# Patient Record
Sex: Female | Born: 1980
Health system: Southern US, Community
[De-identification: ages and names within clinical notes are randomized; demographics above are authoritative.]

## PROBLEM LIST (undated history)

## (undated) DIAGNOSIS — K279 Peptic ulcer, site unspecified, unspecified as acute or chronic, without hemorrhage or perforation: Secondary | ICD-10-CM

## (undated) DIAGNOSIS — J302 Other seasonal allergic rhinitis: Secondary | ICD-10-CM

## (undated) DIAGNOSIS — B009 Herpesviral infection, unspecified: Secondary | ICD-10-CM

## (undated) DIAGNOSIS — M5000 Cervical disc disorder with myelopathy, unspecified cervical region: Secondary | ICD-10-CM

## (undated) DIAGNOSIS — B977 Papillomavirus as the cause of diseases classified elsewhere: Secondary | ICD-10-CM

## (undated) DIAGNOSIS — F32A Depression, unspecified: Secondary | ICD-10-CM

## (undated) DIAGNOSIS — T8859XA Other complications of anesthesia, initial encounter: Secondary | ICD-10-CM

## (undated) DIAGNOSIS — Z7729 Contact with and (suspected ) exposure to other hazardous substances: Secondary | ICD-10-CM

## (undated) DIAGNOSIS — N871 Moderate cervical dysplasia: Secondary | ICD-10-CM

## (undated) DIAGNOSIS — G43909 Migraine, unspecified, not intractable, without status migrainosus: Secondary | ICD-10-CM

## (undated) DIAGNOSIS — J45990 Exercise induced bronchospasm: Secondary | ICD-10-CM

## (undated) DIAGNOSIS — F419 Anxiety disorder, unspecified: Secondary | ICD-10-CM

## (undated) DIAGNOSIS — T4145XA Adverse effect of unspecified anesthetic, initial encounter: Secondary | ICD-10-CM

## (undated) HISTORY — DX: Moderate cervical dysplasia: N87.1

## (undated) HISTORY — PX: ESOPHAGOGASTRODUODENOSCOPY: SHX1529

## (undated) HISTORY — DX: Peptic ulcer, site unspecified, unspecified as acute or chronic, without hemorrhage or perforation: K27.9

## (undated) HISTORY — DX: Contact with and (suspected) exposure to other hazardous substances: Z77.29

## (undated) HISTORY — DX: Other seasonal allergic rhinitis: J30.2

## (undated) HISTORY — DX: Anxiety disorder, unspecified: F41.9

## (undated) HISTORY — DX: Papillomavirus as the cause of diseases classified elsewhere: B97.7

## (undated) HISTORY — DX: Exercise induced bronchospasm: J45.990

## (undated) HISTORY — DX: Herpesviral infection, unspecified: B00.9

## (undated) HISTORY — DX: Depression, unspecified: F32.A

## (undated) HISTORY — DX: Migraine, unspecified, not intractable, without status migrainosus: G43.909

---

## 1999-06-29 HISTORY — PX: OTHER SURGICAL HISTORY: SHX169

## 2002-06-14 ENCOUNTER — Other Ambulatory Visit: Admission: RE | Admit: 2002-06-14 | Discharge: 2002-06-14 | Payer: Self-pay | Admitting: Gynecology

## 2003-08-05 ENCOUNTER — Other Ambulatory Visit: Admission: RE | Admit: 2003-08-05 | Discharge: 2003-08-05 | Payer: Self-pay | Admitting: Obstetrics and Gynecology

## 2004-05-11 ENCOUNTER — Encounter: Admission: RE | Admit: 2004-05-11 | Discharge: 2004-06-10 | Payer: Self-pay | Admitting: Family Medicine

## 2004-08-21 ENCOUNTER — Other Ambulatory Visit: Admission: RE | Admit: 2004-08-21 | Discharge: 2004-08-21 | Payer: Self-pay | Admitting: Gynecology

## 2005-08-23 ENCOUNTER — Other Ambulatory Visit: Admission: RE | Admit: 2005-08-23 | Discharge: 2005-08-23 | Payer: Self-pay | Admitting: Gynecology

## 2006-01-31 ENCOUNTER — Other Ambulatory Visit: Admission: RE | Admit: 2006-01-31 | Discharge: 2006-01-31 | Payer: Self-pay | Admitting: Gynecology

## 2006-09-14 ENCOUNTER — Other Ambulatory Visit: Admission: RE | Admit: 2006-09-14 | Discharge: 2006-09-14 | Payer: Self-pay | Admitting: Gynecology

## 2007-06-14 ENCOUNTER — Other Ambulatory Visit: Admission: RE | Admit: 2007-06-14 | Discharge: 2007-06-14 | Payer: Self-pay | Admitting: Gynecology

## 2008-02-16 ENCOUNTER — Other Ambulatory Visit: Admission: RE | Admit: 2008-02-16 | Discharge: 2008-02-16 | Payer: Self-pay | Admitting: Gynecology

## 2008-07-24 ENCOUNTER — Ambulatory Visit: Payer: Self-pay | Admitting: Women's Health

## 2008-07-24 ENCOUNTER — Encounter: Payer: Self-pay | Admitting: Women's Health

## 2008-07-24 ENCOUNTER — Other Ambulatory Visit: Admission: RE | Admit: 2008-07-24 | Discharge: 2008-07-24 | Payer: Self-pay | Admitting: Gynecology

## 2009-05-05 ENCOUNTER — Encounter: Admission: RE | Admit: 2009-05-05 | Discharge: 2009-05-05 | Payer: Self-pay | Admitting: Allergy

## 2009-05-14 ENCOUNTER — Ambulatory Visit: Payer: Self-pay | Admitting: Women's Health

## 2009-07-25 ENCOUNTER — Ambulatory Visit: Payer: Self-pay | Admitting: Women's Health

## 2009-07-25 ENCOUNTER — Other Ambulatory Visit: Admission: RE | Admit: 2009-07-25 | Discharge: 2009-07-25 | Payer: Self-pay | Admitting: Gynecology

## 2009-10-16 ENCOUNTER — Ambulatory Visit: Payer: Self-pay | Admitting: Women's Health

## 2010-06-05 DIAGNOSIS — J45909 Unspecified asthma, uncomplicated: Secondary | ICD-10-CM | POA: Insufficient documentation

## 2010-06-12 DIAGNOSIS — J309 Allergic rhinitis, unspecified: Secondary | ICD-10-CM | POA: Insufficient documentation

## 2010-07-10 ENCOUNTER — Ambulatory Visit
Admission: RE | Admit: 2010-07-10 | Discharge: 2010-07-10 | Payer: Self-pay | Source: Home / Self Care | Attending: Women's Health | Admitting: Women's Health

## 2010-07-27 ENCOUNTER — Other Ambulatory Visit (HOSPITAL_COMMUNITY)
Admission: RE | Admit: 2010-07-27 | Discharge: 2010-07-27 | Disposition: A | Discharge status: Home / Self Care | Payer: Self-pay | Source: Ambulatory Visit | Attending: Gynecology | Admitting: Gynecology

## 2010-07-27 ENCOUNTER — Ambulatory Visit
Admission: RE | Admit: 2010-07-27 | Discharge: 2010-07-27 | Payer: Self-pay | Source: Home / Self Care | Attending: Women's Health | Admitting: Women's Health

## 2010-07-27 ENCOUNTER — Other Ambulatory Visit: Payer: Self-pay | Admitting: Women's Health

## 2010-07-27 DIAGNOSIS — R87810 Cervical high risk human papillomavirus (HPV) DNA test positive: Secondary | ICD-10-CM | POA: Insufficient documentation

## 2010-07-27 DIAGNOSIS — Z124 Encounter for screening for malignant neoplasm of cervix: Secondary | ICD-10-CM | POA: Insufficient documentation

## 2010-07-29 DIAGNOSIS — B977 Papillomavirus as the cause of diseases classified elsewhere: Secondary | ICD-10-CM

## 2010-07-29 HISTORY — DX: Papillomavirus as the cause of diseases classified elsewhere: B97.7

## 2010-12-09 ENCOUNTER — Other Ambulatory Visit: Payer: Self-pay | Admitting: Gynecology

## 2010-12-09 ENCOUNTER — Ambulatory Visit (INDEPENDENT_AMBULATORY_CARE_PROVIDER_SITE_OTHER): Payer: 59 | Admitting: Gynecology

## 2010-12-09 DIAGNOSIS — R87619 Unspecified abnormal cytological findings in specimens from cervix uteri: Secondary | ICD-10-CM

## 2011-01-27 DIAGNOSIS — G43909 Migraine, unspecified, not intractable, without status migrainosus: Secondary | ICD-10-CM

## 2011-01-27 HISTORY — DX: Migraine, unspecified, not intractable, without status migrainosus: G43.909

## 2011-06-29 HISTORY — PX: LASER ABLATION OF THE CERVIX: SHX1949

## 2011-07-06 ENCOUNTER — Other Ambulatory Visit: Payer: Self-pay | Admitting: Women's Health

## 2011-08-05 ENCOUNTER — Ambulatory Visit (INDEPENDENT_AMBULATORY_CARE_PROVIDER_SITE_OTHER): Payer: 59 | Admitting: Women's Health

## 2011-08-05 ENCOUNTER — Encounter: Payer: Self-pay | Admitting: Women's Health

## 2011-08-05 VITALS — BP 114/72 | Ht 66.5 in | Wt 124.5 lb

## 2011-08-05 DIAGNOSIS — IMO0001 Reserved for inherently not codable concepts without codable children: Secondary | ICD-10-CM

## 2011-08-05 DIAGNOSIS — F32A Depression, unspecified: Secondary | ICD-10-CM

## 2011-08-05 DIAGNOSIS — F419 Anxiety disorder, unspecified: Secondary | ICD-10-CM

## 2011-08-05 DIAGNOSIS — F329 Major depressive disorder, single episode, unspecified: Secondary | ICD-10-CM

## 2011-08-05 DIAGNOSIS — Z01419 Encounter for gynecological examination (general) (routine) without abnormal findings: Secondary | ICD-10-CM

## 2011-08-05 DIAGNOSIS — F3289 Other specified depressive episodes: Secondary | ICD-10-CM

## 2011-08-05 DIAGNOSIS — Z309 Encounter for contraceptive management, unspecified: Secondary | ICD-10-CM

## 2011-08-05 DIAGNOSIS — G43909 Migraine, unspecified, not intractable, without status migrainosus: Secondary | ICD-10-CM

## 2011-08-05 DIAGNOSIS — F341 Dysthymic disorder: Secondary | ICD-10-CM

## 2011-08-05 MED ORDER — DULOXETINE HCL 30 MG PO CPEP: 30.0000 mg | ORAL_CAPSULE | Freq: Every day | ORAL | Status: DC

## 2011-08-05 MED ORDER — LO LOESTRIN FE 1 MG-10 MCG / 10 MCG PO TABS: 1.0000 | ORAL_TABLET | Freq: Every day | ORAL | Status: DC

## 2011-08-05 NOTE — Progress Notes (Signed)
Kristie [name] 1981-02-19 [id number]    History:    The patient presents for annual exam.  Amenorrheic on lo loEstrin FE. Not sexually active. History of ASCUS/+HR HPV with  Pap 2012 with negative C&B. history of CIN-1 in 07 with normal Paps after. History of anxiety and depression stable on Cymbalta and counseling.   Past medical history, past surgical history, family history and social history were all reviewed and documented in the EPIC chart. 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  ROS:  A  ROS was performed and pertinent positives and negatives are included in the history.  Exam:  Filed Vitals:   08/05/11 1535  BP: 114/72    General appearance:  Normal Head/Neck:  Normal, without cervical or supraclavicular adenopathy. Thyroid:  Symmetrical, normal in size, without palpable masses or nodularity. Respiratory  Effort:  Normal  Auscultation:  Clear without wheezing or rhonchi Cardiovascular  Auscultation:  Regular rate, without rubs, murmurs or gallops  Edema/varicosities:  Not grossly evident Abdominal  Soft,nontender, without masses, guarding or rebound.  Liver/spleen:  No organomegaly noted  Hernia:  None appreciated  Skin  Inspection:  Grossly normal  Palpation:  Grossly normal Neurologic/psychiatric  Orientation:  Normal with appropriate conversation.  Mood/affect:  Normal  Genitourinary    Breasts: Examined lying and sitting.     Right: Without masses, retractions, discharge or axillary adenopathy.     Left: Without masses, retractions, discharge or axillary adenopathy.   Inguinal/mons:  Normal without inguinal adenopathy  External genitalia:  Normal  BUS/Urethra/Skene's glands:  Normal  Bladder:  Normal  Vagina:  Normal  Cervix:  Normal  Uterus:   normal in size, shape and contour.  Midline and mobile  Adnexa/parametria:     Rt: Without masses or tenderness.   Lt: Without masses or tenderness.  Anus and perineum: Normal  Digital rectal exam: Normal sphincter tone without palpated  masses or tenderness  Assessment/Plan:  31 y.o. S WF G0  for annual exam.   History of ascus/+HR HPV with negative colposcopy in 2012 Anxiety and depression stable on Cymbalta 30 and counseling Migraines decreased with diet change and Topamax per Dr. Neale Burly  Plan: Lo Loestrin FE prescription, proper use, slight risk for blood clots and strokes, less effective while on Topamax. Encouraged condoms when become sexually active. SBE's, exercise, calcium rich diet, MVI daily encouraged. Prescription proper use of Cymbalta 30 given, we'll continue counseling as well. Had normal labs at [facility] health screening Pap only today. If Pap normal return in 6 months for repeat Pap   Harrington Challenger [facility], 4:38 PM 08/05/2011

## 2011-08-06 ENCOUNTER — Other Ambulatory Visit: Payer: Self-pay

## 2011-08-06 ENCOUNTER — Other Ambulatory Visit (HOSPITAL_COMMUNITY)
Admission: RE | Admit: 2011-08-06 | Discharge: 2011-08-06 | Disposition: A | Discharge status: Home / Self Care | Payer: 59 | Source: Ambulatory Visit | Attending: Obstetrics and Gynecology | Admitting: Obstetrics and Gynecology

## 2011-08-06 DIAGNOSIS — Z01419 Encounter for gynecological examination (general) (routine) without abnormal findings: Secondary | ICD-10-CM

## 2011-08-06 NOTE — Progress Notes (Signed)
Addended by: Venora Maples on: 08/06/2011 08:47 AM   Modules accepted: Orders

## 2011-08-17 ENCOUNTER — Ambulatory Visit (INDEPENDENT_AMBULATORY_CARE_PROVIDER_SITE_OTHER): Payer: 59 | Admitting: Gynecology

## 2011-08-17 ENCOUNTER — Encounter: Payer: Self-pay | Admitting: Gynecology

## 2011-08-17 VITALS — BP 110/70

## 2011-08-17 DIAGNOSIS — N871 Moderate cervical dysplasia: Secondary | ICD-10-CM | POA: Insufficient documentation

## 2011-08-17 NOTE — Progress Notes (Signed)
Patient is a 31 year old gravida 0 who presented to the office today as a result of her recent abnormal Pap smear which demonstrated the following:  HIGH GRADE SQUAMOUS INTRAEPITHELIAL LESION: CIN-2/ CIN-3 (HSIL).  Review of her record indicated the following: 2003 through 2006 normal Pap smears 2007 low-grade SIL with high-risk HPV not detected ( colposcopy negative biopsy 2007 and 2008) 2008 through 2011 normal Pap smears 2012 ASCUS 2013 high-grade squamous intraepithelial lesion: CIN-2/CIN-3 (HSIL)  Colposcopic evaluation today as follows:  Physical Exam  Genitourinary:     Extensive colposcopic evaluation was undertaken to include the external genitalia, perineum, and perirectal region and no abnormality was noted. Upon placement of the speculum the entire vagina was inspected after acetic acid was applied and no lesions were seen. The ectocervix and transformation zone was visualized. A raised leukoplakic area was noted at the 6:00 position which extended from the 3 to the 9:00 position some irregular vascularity was noted around the 3:00 position. The 12:00 position was a small leukoplakic area extending into the endocervical canal but the lesion was visualized entirely. A vigorous ECC was obtained. All the above specimens were labeled appropriately and submitted for histological evaluation. Will await results and plan a course of management. Literature information was provided on the diagnosis and management of abnormal Pap smears. Monsel solution was used for hemostasis. All questions rancher will follow accordingly.

## 2011-08-17 NOTE — Patient Instructions (Signed)
Patient information: Management of high grade cervical squamous intraepithelial lesions (HSIL) and glandular abnormalities (AGC) (Beyond the Basics)  Authors Lanna Poche, MD Thayer Ohm, MD Section Editor Alvera Novel, MD Deputy Editor Morton Amy, MD Disclosures  All topics are updated as new evidence becomes available and our peer review process is complete.  Literature review current through: Jan 2013.  This topic last updated: Aug 09, 2011.  ABNORMAL PAP SMEAR OVERVIEW - High grade cervical squamous intraepithelial lesion (HSIL, also called high grade cervical intraepithelial neoplasia) is the name given to moderately to severely abnormal-appearing cells on a Pap smear (also called a cervical cytology test). Any woman with HSIL requires further evaluation to determine if cancerous cells are present. While only about 2 percent of women with HSIL have invasive cancer, up to 20 percent of women with HSIL will eventually develop cancer if the abnormality is not treated. Atypical glandular cells (AGC) is the name given to abnormal appearing glandular cells on a cervical cytology test. Glandular cells line the opening in the cervix (picture 1). AGC is a relatively uncommon result, although it always requires further evaluation. AGC can be caused by benign conditions, such as cervical polyps, or more serious conditions, such as cancer of the cervix, uterine lining (endometrium), ovary, or fallopian tube. This topic review discusses the management of women with high grade squamous intraepithelial lesions (HSIL) and glandular abnormalities (AGC) of the cervix. Management of atypical squamous cells (ASC-US and ASC-H) and low grade squamous intraepithelial lesions (LSIL) is discussed separately. (See "Patient information: Management of atypical squamous cells (ASC-US and ASC-H) and low grade cervical squamous intraepithelial lesions (LSIL) (Beyond the Basics)".) Cervical cancer  screening tests are also discussed in a separate topic review. (See "Patient information: Cervical cancer screening (Beyond the Basics)".) HIGH-GRADE SQUAMOUS LESION (HSIL) - HSIL refers to moderate to severe precancerous changes of the cells of the cervix. Approximately 2 percent of women with HSIL on a Pap smear are found to have invasive cervical cancer when they undergo further evaluation and another 20 percent of women with HSIL will develop cervical cancer over a period of several years if they are not treated. However, if the precancerous lesion is removed or destroyed, cervical cancer can usually be prevented. Evaluation of HSIL - All women with high-grade (HSIL) on Pap smear should have one of the following: Colposcopy of the cervix, including biopsy of any abnormal areas and endocervical curettage (ECC). Management after colposcopy depends upon the results. (See 'Colposcopy' below.)   If the healthcare provider is unable to see the entire cervix during colposcopy, surgical removal of the abnormal area is recommended (eg, loop electrosurgical excision, conization). (See "Patient information: Treatment of precancerous cells of the cervix (Beyond the Basics)".)  Alternatively, the woman and her provider may decide to remove the abnormal area at the time of the initial colposcopy. This is called "see and treat". (See "Patient information: Treatment of precancerous cells of the cervix (Beyond the Basics)".) This option is not recommended for adolescents and pregnant women. (See 'Special populations' below.) COLPOSCOPY - Colposcopy is an office procedure that allows a clinician to closely examine the cervix. It is commonly performed after an abnormal Pap smear. Colposcopy is performed while the woman lies on an examination table, similar to a routine pelvic examination. A speculum is used to view the cervix, and the viewing device (called a colposcope) remains outside the woman's body (picture 1). The  colposcope magnifies the appearance of the cervix. This allows  the clinician to better see the location and size of any abnormalities, and also to see any changes in the capillaries (small blood vessels) on the surface of the cervix. Capillary changes are not detected by cervical cytology or human papillomavirus (HPV tests), but are important signs of the severity of cervical abnormalities. During colposcopy, a small piece of the abnormal area can be removed (biopsied). Anesthesia (numbing medicine) is not needed because the biopsy causes only mild discomfort or cramping. Women with HSIL or AGC usually require a biopsy of the inner cervix during colposcopy; this is called endocervical curettage (ECC). Endocervix refers to the inner cervix and curettage means scraping. Pregnant women should not have ECC because it may disturb the pregnancy. Management of HSIL after colposcopy - Most women with HSIL results on a Pap smear will have a biopsy of any abnormal-appearing areas during colposcopy. The biopsy samples are sent to a pathologist, who determines if there is any evidence of precancerous changes, termed cervical intraepithelial neoplasia (CIN). These changes are categorized as being mild (CIN 1) or moderate to severe (CIN 2 or 3). The following management strategies apply to non-pregnant women who are 82 years-old or older. Management of adolescents and pregnant women is discussed separately (see 'Special populations' below). CIN 2 or 3 - If the healthcare provider is able to see the entire cervix during colposcopy and the biopsy shows CIN 2 or 3, treatment to remove (excise) the abnormal area is recommended to prevent cancer. Delaying treatment (eg, watching and waiting) is not recommended for women age 40 or older with CIN 2 or 3, given the high risk of progression to cancer. (See "Patient information: Treatment of precancerous cells of the cervix (Beyond the Basics)".) CIN 1 or less preceded by HSIL -  Colposcopy can miss a significant number of seriously abnormal CIN lesions. If a woman has HSIL but the colposcopy/biopsy do not show a high grade lesion, the woman and her provider need to decide what else should be done to make sure a serious lesion has not been missed. The following options are available: Close monitoring, including cervical cytology and colposcopy at 6 and 12 months. At these visits, the provider must be able to see the entire cervix during colposcopy and a test of the inner cervix (called endocervical curettage) must be negative. This may be the preferred approach for younger women who would like to preserve their ability to carry a pregnancy in the future.  If these tests are negative, the woman may return to once yearly testing.  If either test show persistent HSIL, a treatment to remove the abnormal area is recommended. (See "Patient information: Treatment of precancerous cells of the cervix (Beyond the Basics)".)  Remove the abnormal area. Excision is recommended because it can both treat any abnormal areas and determine with certainty what abnormality was present. (See "Patient information: Treatment of precancerous cells of the cervix (Beyond the Basics)".)  In some cases, a healthcare provider will request an expert review of the woman's cytology and biopsy. This generally involves sending the cervical cytology and biopsy slides to an outside pathologist who is expert in evaluating abnormal Pap smears. If the expert feels that the woman has moderate to severe changes, a treatment to remove the abnormal area may be recommended. If the pathologist feels that there are mild to moderate changes, the woman and her provider may elect to monitor these changes with cervical cytology and colposcopy every six months. SQUAMOUS CELL CARCINOMA - Squamous cell carcinoma  is the medical term for cervical cancer. Women with this result require a biopsy, which is usually performed with colposcopy  (see 'Colposcopy' above). If the biopsy confirms that cancerous cells are present, treatment is strongly recommended. The diagnosis and treatment of early stage cervical cancer is discussed in a separate article. (See "Patient information: Cervical cancer treatment; early stage cancer (Beyond the Basics)".) GLANDULAR CELL ABNORMALITIES (AGC) - Glandular cells develop from the inside of the cervix (called the endocervical canal). Glandular cells can also come from the endometrium (lining of the uterus), the fallopian tube, or the ovary (figure 1). Evaluation - All women with atypical glandular cells (AGC) require further testing, including HPV testing, colposcopy, cervical biopsy, endocervical curettage, and often endometrial biopsy. This is because 10 to 40 percent of women with atypical glandular cells have a precancerous or cancerous abnormality. These tests are usually performed in a single visit. HPV testing is done by sweeping the surface of the cervix with a brush, which is then placed into a vial containing a liquid preservative. The vial is sent to a laboratory for evaluation.  Colposcopy, cervical biopsy, and endocervical curettage are described above (see 'Colposcopy' above).  Endometrial biopsy is performed by inserting a thin instrument through the vagina into the uterus to obtain a small sample of endometrial tissue. The tissue is then sent to a pathologist, who examines it with a microscope. The biopsy can be performed in a healthcare provider's office without anesthesia. Management after colposcopy If all of these tests are normal and the initial cervical cytology test showed AGC-NOS (not otherwise specified), follow-up recommendations depend on the HPV status. If the HPV test was positive (or unknown), a repeat cervical cytology smear and HPV test are recommended at six months. If the HPV test was negative, the cervical cytology smear and HPV test should be repeated at 12 months. If either  is positive, a repeat colposcopy with biopsies is recommended. If both tests are negative, the woman may return to routine screening.  If all of these tests are normal and the initial cervical cytology test showed AGC-favor neoplasia or adenocarcinoma in situ, a treatment to remove (excise, not ablate) the abnormal area is recommended. (See "Patient information: Treatment of precancerous cells of the cervix (Beyond the Basics)".) SPECIAL POPULATIONS Pregnant women - The evaluation and management of pregnant women is different from non-pregnant women because of the risk that treatment to remove abnormal cervical tissue could lead to significant bleeding or preterm labor or delivery. Cervical biopsy is performed only as necessary in pregnant women. If a pregnant woman has a Pap smear with high-grade intraepithelial lesion (HSIL) or atypical glandular cells (AGC), a colposcopy should be done. If a biopsy is performed, the results are managed as follows: CIN 2 or 3 - If the biopsy confirms CIN 2 or 3, the woman and her provider can choose to repeat the cervical cytology and colposcopy later during the pregnancy (three to four months later) or after the woman delivers her baby (six or more weeks after delivery). Treatment to remove the abnormal area is not recommended during pregnancy unless invasive cancer is suspected.  The reason for this recommendation is that CIN 2 or 3 is caused by precancerous changes that have the potential to become cancerous when untreated. This is a slow process that takes many months to years. As long as the abnormality is monitored, it is not necessary to remove the area (and increase the risk of preterm delivery or miscarriage) until after delivery.  CIN 1 - After a cervical cytology report showing HSIL, if a pregnant woman's colposcopy and biopsy show CIN 1, a repeat Pap smear and colposcopy should be done six weeks after the woman delivers her baby.   The reason for this  recommendation is that cervix appears somewhat different during pregnancy, which can make it difficult to determine if an area appears abnormal due to pregnancy or due to precancerous changes. Evaluating the area after delivery allows the provider to determine with more certainty if treatment is needed. Adolescents - Cervical cancer screening is recommended starting at age 62 years. In this discussion, adolescents refers to age 25 or younger. In adolescents, abnormal cervical cytology is often approached differently because, in this age group, there is a good chance that mild abnormalities will resolve over time, without treatment. There is a high rate of HPV infection in this group, but a very low rate of cervical cancer. If a Pap smear is performed in someone who is age 40 or younger, the recommendations are:  HSIL - Colposcopy should be done. Treatment may be needed, but it should not be done before the results of biopsies are known. If the results of  AGC - The management of AGC in adolescents is the same as in other women.  Management after colposcopy differs somewhat in adolescents from women age 56 or older. The next steps depend upon the results of biopsies that were done during colposcopy. Biopsies may show a precancerous condition of the cervix called cervical intraepithelial lesion (CIN). These changes are categorized as being mild (CIN 1) or moderate to severe (CIN 2 or 3). CIN 2 or 3 - CIN 2 or 3 is usually treated by removing the abnormal area. There are different methods for this, which are referred to as a cone biopsy or loop electrosurgical excision procedure (also referred to as LEEP, loop, or LLETZ). In adolescents, another option is close observation by repeating the Pap smear and colposcopy every six months for up to two years. If two consecutive tests show that the abnormality has resolved, the adolescent may return to Pap smears every 12 months.  If the abnormality worsens or if the  provider is unable to see the entire cervix during colposcopy, a treatment to remove the abnormal area is recommended. (See "Patient information: Treatment of precancerous cells of the cervix (Beyond the Basics)".)  The advantage of watchful waiting is that it may allow the abnormality time to heal and potentially avoid the need for an excisional treatment. The disadvantage is that a greater number of follow up visits may be needed, depending upon whether the area improves or worsens over time. If the abnormality worsens or does not resolve, an excisional treatment may eventually be needed.  CIN 1 - Repeat Pap smear at 12 months.

## 2011-08-30 ENCOUNTER — Ambulatory Visit (INDEPENDENT_AMBULATORY_CARE_PROVIDER_SITE_OTHER): Payer: 59 | Admitting: Gynecology

## 2011-08-30 ENCOUNTER — Encounter: Payer: Self-pay | Admitting: Gynecology

## 2011-08-30 VITALS — BP 110/70

## 2011-08-30 DIAGNOSIS — N871 Moderate cervical dysplasia: Secondary | ICD-10-CM

## 2011-08-30 NOTE — Progress Notes (Signed)
Kristie [name] is an 31 y.o. female. Who presented to the office today for preoperative consultation. Patient was seen in the office on February 19 for colposcopic evaluation as a result of her recent Pap smear which demonstrated high grade squamous intraepithelial lesion as follows:  HIGH GRADE SQUAMOUS INTRAEPITHELIAL LESION: CIN-2/ CIN-3 (HSIL).  Her past history of Pap smears as follows:  2003 through 2006 normal Pap smears  2007 low-grade SIL with high-risk HPV not detected ( colposcopy negative biopsy 2007 and 2008)  2008 through 2011 normal Pap smears  2012 ASCUS  2013 high-grade squamous intraepithelial lesion: CIN-2/CIN-3 (HSIL)   Her recent colposcopic directed biopsy done in the office in February 19 with the following result:  Diagnosis 1. Cervix, biopsy, 3 o'clock - DETACHED DYSPLASTIC MINUTE SQUAMOUS EPITHELIAL FRAGMENT. - DETACHED UNREMARKABLE ENDOCERVICAL GLANDULAR FRAGMENTS. - SEE COMMENT. 2. Cervix, biopsy, 6 o'clock - TRANSFORMATION ZONE MUCOSA WITH MODERATE SQUAMOUS DYSPLASIA (CIN-II). - SEE COMMENT. 3. Cervix, biopsy, 9 o'clock - DETACHED ECTOCERVICAL MUCOSAL FRAGMENTS SHOWING MILD SQUAMOUS DYSPLASIA (CIN-I). - SEPARATE DETACHED ENDOCERVICAL MUCOSAL FRAGMENTS WITHOUT ATYPIA. - SEE COMMENT. 4. Cervix, biopsy, 12 o'clock - DETACHED ECTOCERVICAL SQUAMOUS MUCOSA SHOWING MODERATE SQUAMOUS DYSPLASIA (CIN-II). - SEPARATE UNREMARKABLE DETACHED ENDOCERVICAL MUCOSAL FRAGMENTS WITHOUT ATYPIA. - SEE COMMENT. 5. Endocervix, curettage - DETACHED UNREMARKABLE ENDOCERVICAL AND ECTOCERVICAL MUCOSAL FRAGMENTS. - NO DYSPLASIA, ATYPIA OR MALIGNANCY IDENTIFIED.  Patient was re\re colposcoped again the office today and since the areas are wide on the ectocervix she will be best treated with CO2 laser ablation.  Pertinent Gynecological History: Menses: flow is light Bleeding: Lites Contraception: OCP (estrogen/progesterone) DES exposure: denies Blood transfusions:  none Sexually transmitted diseases: no past history Previous GYN Procedures: Colposcopy with biopsy  Last mammogram: Not indicated Date:  not indicated  Last pap: See above Date:  See above OB History: G0, P 0    Menstrual History: Menarche age: 56  Patient's last menstrual period was 06/28/2010.    Past Medical History  Diagnosis Date  . Asthma, exercise induced   . Depression   . HPV in female 07/2010    high risk  . Migraines 8-12    SEES DR[facility]    Past Surgical History  Procedure Date  . Vaginal cyst excised 2001    Family History  Problem Relation Age of Onset  . Hypertension Mother   . Cancer Maternal Aunt     breast two times  . Breast cancer Maternal Aunt   . Cancer Maternal Uncle     PROSTATE  . Hypertension Maternal Grandmother   . Heart disease Maternal Grandmother   . Cancer Maternal Grandfather     LUNG CANCER    Social History:  reports that she has quit smoking. She has never used smokeless tobacco. She reports that she does not drink alcohol or use illicit drugs.  Allergies:  Allergies  Allergen Reactions  . Zoloft     SEVERE PARANOID     (Not in a hospital admission)  @ROS @  Blood pressure 110/70, last menstrual period 06/28/2010.  Physical Exam:  HEENT:unremarkable Neck:Supple, midline, no thyroid megaly, no carotid bruits Lungs:  Clear to auscultation no rhonchi's or wheezes Heart:Regular rate and rhythm, no murmurs or gallops Breast Exam: unremarkable  Abdomen: soft nontender no rebound or guarding Pelvic:BUS Within normal limits Vagina: No gross lesions on inspection Cervix: see note above on colposcopic evaluation Uterus: anteverted normal size shape and consistency Adnexa: no palpable masses or tenderness Extremities: No cords, no edema Rectal: not examined  No results found for this or any previous visit (from the past 24 hour(s)).  No results found.  Assessment/Plan: patient with CIN-1 CIN-2 covering large  surface area of the ectocervix we'll best be served with the structure therapy such as with CO2 laser instead of doing such a wide cervical cone biopsy which may cause problems such as incompetent cervix in the future. Now that we have ruled out CIN-3 this would be the best treatment approach. The risks benefits and pros and cons of the operation were discussed with the patient to include infection, bleeding, or injury to nearby structures such as the bladder or rectum or vaginal walls from the laser. Literature information and previous been provided all questions were answered we'll follow accordingly.   Ok Edwards 08/30/2011, 12:25 PM

## 2011-08-31 ENCOUNTER — Telehealth: Payer: Self-pay

## 2011-08-31 NOTE — Telephone Encounter (Signed)
I called patient and left message on her voice mail that I was able to schedule her outpt surgery on the date she requested. March 28 7:30am.  I have mailed pamphlet to her from [facility] and she will expect a call from the nurse there the week of her surgery.  I told her to call me if any questions and gave her my direct phone number.

## 2011-09-14 ENCOUNTER — Telehealth: Payer: Self-pay

## 2011-09-14 NOTE — Telephone Encounter (Signed)
Patient is scheduled for Laser Of Cervix next Thursday, March 28th.  She had a couple of questions.  1.   How soon after surgery can she exercise?   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  2.  Will you be prescribing RX for pain after procedure. If so she would appreciate if you could call it in ahead of time so she can pick it up at the pharmacy as [facility] as she works there and that would be more convenient to get ahead of time.

## 2011-09-14 NOTE — Telephone Encounter (Signed)
Patient advised.

## 2011-09-14 NOTE — Telephone Encounter (Signed)
After the CO2 laser of her cervix she can return back to exercise in one week but no intercourse for 4 weeks. She will not need anything stronger than ibuprofen that she can buy over-the-counter.

## 2011-09-17 ENCOUNTER — Encounter (HOSPITAL_BASED_OUTPATIENT_CLINIC_OR_DEPARTMENT_OTHER): Payer: Self-pay | Admitting: *Deleted

## 2011-09-17 NOTE — Progress Notes (Signed)
To [facility] at 0600.Npo after mn,CBC,urine pregnancy,urinalysis on arrival ,to use proair,take cymbalta and loestrin with sip water only that am.

## 2011-09-23 ENCOUNTER — Encounter (HOSPITAL_BASED_OUTPATIENT_CLINIC_OR_DEPARTMENT_OTHER)
Admission: RE | Disposition: A | Discharge status: Home / Self Care | Payer: Self-pay | Source: Ambulatory Visit | Attending: Gynecology

## 2011-09-23 ENCOUNTER — Ambulatory Visit (HOSPITAL_BASED_OUTPATIENT_CLINIC_OR_DEPARTMENT_OTHER): Payer: 59 | Admitting: Anesthesiology

## 2011-09-23 ENCOUNTER — Ambulatory Visit (HOSPITAL_BASED_OUTPATIENT_CLINIC_OR_DEPARTMENT_OTHER)
Admission: RE | Admit: 2011-09-23 | Discharge: 2011-09-23 | Disposition: A | Discharge status: Home / Self Care | Payer: 59 | Source: Ambulatory Visit | Attending: Gynecology | Admitting: Gynecology

## 2011-09-23 ENCOUNTER — Encounter (HOSPITAL_BASED_OUTPATIENT_CLINIC_OR_DEPARTMENT_OTHER): Payer: Self-pay | Admitting: Anesthesiology

## 2011-09-23 ENCOUNTER — Encounter (HOSPITAL_BASED_OUTPATIENT_CLINIC_OR_DEPARTMENT_OTHER): Payer: Self-pay | Admitting: *Deleted

## 2011-09-23 DIAGNOSIS — N871 Moderate cervical dysplasia: Secondary | ICD-10-CM | POA: Insufficient documentation

## 2011-09-23 DIAGNOSIS — F329 Major depressive disorder, single episode, unspecified: Secondary | ICD-10-CM

## 2011-09-23 DIAGNOSIS — F32A Depression, unspecified: Secondary | ICD-10-CM

## 2011-09-23 DIAGNOSIS — F419 Anxiety disorder, unspecified: Secondary | ICD-10-CM

## 2011-09-23 DIAGNOSIS — G43909 Migraine, unspecified, not intractable, without status migrainosus: Secondary | ICD-10-CM

## 2011-09-23 DIAGNOSIS — D069 Carcinoma in situ of cervix, unspecified: Secondary | ICD-10-CM

## 2011-09-23 LAB — CBC
HCT: 39.7 % (ref 36.0–46.0)
Hemoglobin: 13.4 g/dL (ref 12.0–15.0)
MCH: 31.2 pg (ref 26.0–34.0)
MCHC: 33.8 g/dL (ref 30.0–36.0)
MCV: 92.3 fL (ref 78.0–100.0)
Platelets: 178 10*3/uL (ref 150–400)
RBC: 4.3 MIL/uL (ref 3.87–5.11)
RDW: 13.1 % (ref 11.5–15.5)
WBC: 4.7 10*3/uL (ref 4.0–10.5)

## 2011-09-23 LAB — URINE MICROSCOPIC-ADD ON

## 2011-09-23 LAB — POCT PREGNANCY, URINE: Preg Test, Ur: NEGATIVE

## 2011-09-23 LAB — URINALYSIS, ROUTINE W REFLEX MICROSCOPIC
Bilirubin Urine: NEGATIVE
Glucose, UA: NEGATIVE mg/dL
Hgb urine dipstick: NEGATIVE
Ketones, ur: NEGATIVE mg/dL
Nitrite: NEGATIVE
Protein, ur: NEGATIVE mg/dL
Specific Gravity, Urine: 1.02 (ref 1.005–1.030)
Urobilinogen, UA: 0.2 mg/dL (ref 0.0–1.0)
pH: 7 (ref 5.0–8.0)

## 2011-09-23 SURGERY — ABLATION, CERVIX
Anesthesia: General | Site: Cervix | Wound class: Clean Contaminated

## 2011-09-23 MED ORDER — FERRIC SUBSULFATE SOLN
Status: DC | PRN
  Administered 2011-09-23: 1

## 2011-09-23 MED ORDER — IODINE STRONG (LUGOLS) 5 % PO SOLN
ORAL | Status: DC | PRN
  Administered 2011-09-23: 0.2 mL via ORAL

## 2011-09-23 MED ORDER — ESTRADIOL 0.1 MG/GM VA CREA
TOPICAL_CREAM | VAGINAL | Status: DC | PRN
  Administered 2011-09-23: 1 via VAGINAL

## 2011-09-23 MED ORDER — DEXAMETHASONE SODIUM PHOSPHATE 4 MG/ML IJ SOLN
INTRAMUSCULAR | Status: DC | PRN
  Administered 2011-09-23: 4 mg via INTRAVENOUS

## 2011-09-23 MED ORDER — OXYCODONE-ACETAMINOPHEN 5-500 MG PO CAPS: 1.0000 | ORAL_CAPSULE | ORAL | Status: AC | PRN

## 2011-09-23 MED ORDER — LACTATED RINGERS IV SOLN
INTRAVENOUS | Status: DC
Start: 2011-09-23 — End: 2011-09-23
  Administered 2011-09-23 (×2): via INTRAVENOUS

## 2011-09-23 MED ORDER — ACETIC ACID 5 % SOLN
Status: DC | PRN
  Administered 2011-09-23: 1 via TOPICAL

## 2011-09-23 MED ORDER — ONDANSETRON HCL 4 MG/2ML IJ SOLN
INTRAMUSCULAR | Status: DC | PRN
  Administered 2011-09-23: 4 mg via INTRAVENOUS

## 2011-09-23 MED ORDER — KETOROLAC TROMETHAMINE 30 MG/ML IJ SOLN
INTRAMUSCULAR | Status: DC | PRN
Start: 2011-09-23 — End: 2011-09-23
  Administered 2011-09-23: 30 mg via INTRAVENOUS

## 2011-09-23 MED ORDER — CLINDAMYCIN PHOSPHATE 2 % VA CREA: 1.0000 | TOPICAL_CREAM | Freq: Every day | VAGINAL | Status: AC

## 2011-09-23 MED ORDER — MIDAZOLAM HCL 5 MG/5ML IJ SOLN
INTRAMUSCULAR | Status: DC | PRN
  Administered 2011-09-23: 2 mg via INTRAVENOUS

## 2011-09-23 MED ORDER — LIDOCAINE HCL (CARDIAC) 20 MG/ML IV SOLN
INTRAVENOUS | Status: DC | PRN
  Administered 2011-09-23: 50 mg via INTRAVENOUS

## 2011-09-23 MED ORDER — METOCLOPRAMIDE HCL 10 MG PO TABS: 10.0000 mg | ORAL_TABLET | Freq: Three times a day (TID) | ORAL | Status: AC

## 2011-09-23 MED ORDER — OXYCODONE-ACETAMINOPHEN 5-325 MG PO TABS
1.0000 | ORAL_TABLET | ORAL | Status: AC | PRN
  Administered 2011-09-23: 1 via ORAL

## 2011-09-23 MED ORDER — FENTANYL CITRATE 0.05 MG/ML IJ SOLN
25.0000 ug | INTRAMUSCULAR | Status: DC | PRN
  Administered 2011-09-23: 25 ug via INTRAVENOUS

## 2011-09-23 MED ORDER — PROPOFOL 10 MG/ML IV EMUL
INTRAVENOUS | Status: DC | PRN
  Administered 2011-09-23: 170 mg via INTRAVENOUS

## 2011-09-23 MED ORDER — FENTANYL CITRATE 0.05 MG/ML IJ SOLN
INTRAMUSCULAR | Status: DC | PRN
  Administered 2011-09-23: 50 ug via INTRAVENOUS

## 2011-09-23 SURGICAL SUPPLY — 46 items
APPLICATOR COTTON TIP 6IN STRL (MISCELLANEOUS) ×2 IMPLANT
BLADE SURG 15 STRL LF DISP TIS (BLADE) ×1 IMPLANT
BLADE SURG 15 STRL SS (BLADE)
CANISTER SUCTION 1200CC (MISCELLANEOUS) IMPLANT
CANISTER SUCTION 2500CC (MISCELLANEOUS) IMPLANT
CATH ROBINSON RED A/P 14FR (CATHETERS) ×1 IMPLANT
CLOTH BEACON ORANGE TIMEOUT ST (SAFETY) ×2 IMPLANT
COVER TABLE BACK 60X90 (DRAPES) ×2 IMPLANT
DEPRESSOR TONGUE BLADE STERILE (MISCELLANEOUS) ×2 IMPLANT
DRAPE LG THREE QUARTER DISP (DRAPES) ×2 IMPLANT
DRAPE UNDERBUTTOCKS STRL (DRAPE) ×2 IMPLANT
ELECT BALL LEEP 3MM BLK (ELECTRODE) ×2 IMPLANT
ELECT NDL TIP 2.8 STRL (NEEDLE) IMPLANT
ELECT NEEDLE TIP 2.8 STRL (NEEDLE) IMPLANT
ELECT REM PT RETURN 9FT ADLT (ELECTROSURGICAL) ×2
ELECTRODE REM PT RTRN 9FT ADLT (ELECTROSURGICAL) ×1 IMPLANT
GLOVE ECLIPSE 7.5 STRL STRAW (GLOVE) ×2 IMPLANT
GLOVE INDICATOR 8.0 STRL GRN (GLOVE) ×2 IMPLANT
GOWN PREVENTION PLUS LG XLONG (DISPOSABLE) ×2 IMPLANT
GOWN STRL REIN XL XLG (GOWN DISPOSABLE) ×2 IMPLANT
LEGGING LITHOTOMY PAIR STRL (DRAPES) ×2 IMPLANT
NDL SAFETY ECLIPSE 18X1.5 (NEEDLE) IMPLANT
NEEDLE HYPO 18GX1.5 SHARP (NEEDLE)
NS IRRIG 500ML POUR BTL (IV SOLUTION) IMPLANT
PACK BASIN DAY SURGERY FS (CUSTOM PROCEDURE TRAY) ×2 IMPLANT
PAD OB MATERNITY 4.3X12.25 (PERSONAL CARE ITEMS) ×2 IMPLANT
PAD PREP 24X48 CUFFED NSTRL (MISCELLANEOUS) ×2 IMPLANT
PENCIL BUTTON HOLSTER BLD 10FT (ELECTRODE) ×2 IMPLANT
SCOPETTES 8  STERILE (MISCELLANEOUS) ×2
SCOPETTES 8 STERILE (MISCELLANEOUS) IMPLANT
SUT VIC AB 2-0 PS2 27 (SUTURE) ×2 IMPLANT
SUT VIC AB 3-0 CT1 27 (SUTURE)
SUT VIC AB 3-0 CT1 27XBRD (SUTURE) IMPLANT
SUT VIC AB 3-0 FS2 27 (SUTURE) ×2 IMPLANT
SUT VIC AB 3-0 X1 27 (SUTURE) IMPLANT
SWAB CULTURE LIQ STUART DBL (MISCELLANEOUS) IMPLANT
SYR TB 1ML LL NO SAFETY (SYRINGE) IMPLANT
SYRINGE IRR TOOMEY STRL 70CC (SYRINGE) IMPLANT
TOWEL OR 17X24 6PK STRL BLUE (TOWEL DISPOSABLE) ×4 IMPLANT
TRAY DSU PREP LF (CUSTOM PROCEDURE TRAY) ×2 IMPLANT
TUBE ANAEROBIC SPECIMEN COL (MISCELLANEOUS) IMPLANT
TUBE CONNECTING 12X1/4 (SUCTIONS) IMPLANT
VACUUM HOSE 7/8X10 W/ WAND (MISCELLANEOUS) IMPLANT
VACUUM HOSE/TUBING 7/8INX6FT (MISCELLANEOUS) ×2 IMPLANT
WATER STERILE IRR 500ML POUR (IV SOLUTION) ×3 IMPLANT
YANKAUER SUCT BULB TIP NO VENT (SUCTIONS) IMPLANT

## 2011-09-23 NOTE — Op Note (Signed)
09/23/2011  8:09 AM  PATIENT:  Kristie [name]  31 y.o. female with the following Pap smear history and recent colposcopic directed biopsy results that led to today's CO2 laser ablation of cervical dysplasia:  2003 through 2006 normal Pap smears  2007 low-grade SIL with high-risk HPV not detected ( colposcopy negative biopsy 2007 and 2008)  2008 through 2011 normal Pap smears  2012 ASCUS  2013 high-grade squamous intraepithelial lesion: CIN-2/CIN-3 (HSIL)  Her recent colposcopic directed biopsy done in the office in February 19 with the following result:  Diagnosis  1. Cervix, biopsy, 3 o'clock  - DETACHED DYSPLASTIC MINUTE SQUAMOUS EPITHELIAL FRAGMENT.  - DETACHED UNREMARKABLE ENDOCERVICAL GLANDULAR FRAGMENTS.  - SEE COMMENT.  2. Cervix, biopsy, 6 o'clock  - TRANSFORMATION ZONE MUCOSA WITH MODERATE SQUAMOUS DYSPLASIA (CIN-II).  - SEE COMMENT.  3. Cervix, biopsy, 9 o'clock  - DETACHED ECTOCERVICAL MUCOSAL FRAGMENTS SHOWING MILD SQUAMOUS  DYSPLASIA (CIN-I).  - SEPARATE DETACHED ENDOCERVICAL MUCOSAL FRAGMENTS WITHOUT  ATYPIA.  - SEE COMMENT.  4. Cervix, biopsy, 12 o'clock  - DETACHED ECTOCERVICAL SQUAMOUS MUCOSA SHOWING MODERATE  SQUAMOUS DYSPLASIA (CIN-II).  - SEPARATE UNREMARKABLE DETACHED ENDOCERVICAL MUCOSAL FRAGMENTS  WITHOUT ATYPIA.  - SEE COMMENT.  5. Endocervix, curettage  - DETACHED UNREMARKABLE ENDOCERVICAL AND ECTOCERVICAL MUCOSAL  FRAGMENTS.  - NO DYSPLASIA, ATYPIA OR MALIGNANCY IDENTIFIED.   PRE-OPERATIVE DIAGNOSIS:  moderate cervical dysplasia (CIN 1/CIN 2) POST-OPERATIVE DIAGNOSIS:  moderate cervical dysplagia (CIN-1/CIN-2)  PROCEDURE:  Procedure(s): CO2 laser ablation of cervical dysplasia  SURGEON:  Surgeon(s): Ok Edwards, MD  ANESTHE2SIA:   general  FINDINGS: Acetowhite area was noted after application of acetic acid at the 3 and 6 and 9:00 position. Transformation zone visualized  DESCRIPTION OF OPERATION: The patient was taken to the operating  room where she underwent successful general endotracheal anesthesia she was placed in the high lithotomy position. Wet towels were placed around the external genitalia. The titanium coated speculum was introduced into the vagina. With an attachment for the plume extractor. Acetic acid was applied and the colposcope was brought into view the entire vagina fornices and cervix were reinspected. Previously biopsy sites at the 3 and 6 and 9:00 position were seen. With the CO2 laser on 8 W setting a circumferential marking distal from the transformation zone was marked. In a brush like fashion the cervix was ablated to a depth of approximately 2 mm. In a circumferential fashion. After completion of the operation Estrace vaginal cream was placed on the cervical bed. The patient given Toradol 30 mg IV and transferred to recovery stable vital signs.  ESTIMATED BLOOD LOSS: None  Intake/Output Summary (Last 24 hours) at 09/23/11 0809 Last data filed at 09/23/11 0732  Gross per 24 hour  Intake    200 ml  Output      0 ml  Net    200 ml     BLOOD ADMINISTERED:none   LOCAL MEDICATIONS USED:  NONE  SPECIMEN:  Source of Specimen:  None  DISPOSITION OF SPECIMEN:  N/A  COUNTS:  YES and  PLAN OF CARE: Transfer to PACU  [facility] HMD8:09 AMTD@

## 2011-09-23 NOTE — Anesthesia Preprocedure Evaluation (Signed)
Anesthesia Evaluation  Patient identified by MRN, date of birth, ID band Patient awake    Reviewed: Allergy & Precautions, H&P , NPO status , Patient's Chart, lab work & pertinent test results, reviewed documented beta blocker date and time   Airway Mallampati: II TM Distance: >3 FB Neck ROM: Full    Dental  (+) Teeth Intact and Dental Advisory Given   Pulmonary asthma ,  Exercise induced asthma,mild breath sounds clear to auscultation        Cardiovascular negative cardio ROS  Rhythm:Regular Rate:Normal  Denies cardiac symptoms   Neuro/Psych negative neurological ROS  negative psych ROS   GI/Hepatic negative GI ROS, Neg liver ROS,   Endo/Other  negative endocrine ROS  Renal/GU negative Renal ROS   Cervical dysplasia    Musculoskeletal negative musculoskeletal ROS (+)   Abdominal   Peds negative pediatric ROS (+)  Hematology negative hematology ROS (+)   Anesthesia Other Findings   Reproductive/Obstetrics negative OB ROS                           Anesthesia Physical Anesthesia Plan  ASA: II  Anesthesia Plan: General   Post-op Pain Management:    Induction: Intravenous  Airway Management Planned: LMA  Additional Equipment:   Intra-op Plan:   Post-operative Plan: Extubation in OR  Informed Consent: I have reviewed the patients History and Physical, chart, labs and discussed the procedure including the risks, benefits and alternatives for the proposed anesthesia with the patient or authorized representative who has indicated his/her understanding and acceptance.   Dental advisory given  Plan Discussed with: CRNA and Surgeon  Anesthesia Plan Comments:         Anesthesia Quick Evaluation

## 2011-09-23 NOTE — Interval H&P Note (Signed)
History and Physical Interval Note:  09/23/2011 7:19 AM  Kristie [name]  has presented today for surgery, with the diagnosis of moderate cervical dysplasia  The various methods of treatment have been discussed with the patient and family. After consideration of risks, benefits and other options for treatment, the patient has consented to  Procedure(s) (LRB): CERVICAL ABLATION (N/A) as a surgical intervention .  The patients' history has been reviewed, patient examined, no change in status, stable for surgery.  I have reviewed the patients' chart and labs.  Questions were answered to the patient's satisfaction.     Ok Edwards

## 2011-09-23 NOTE — Discharge Instructions (Signed)
Cervical Dysplasia Cervical dysplasia is a condition in which a woman has abnormal changes in the cells of her cervix. The cervix is the opening to the uterus (womb) between the vagina and the uterus. These changes are called cervical dysplasia and may be the first signs of cervical cancer. These cells can be taken from the cervix during a Pap test and then looked at under a microscope. With early detection, treatment, and close follow-up care, nearly all cervical dysplasia can be cured. If untreated, the mild to moderate stages of dysplasia often grow more severe.  RISK FACTORS  The following increase the risk for cervical dysplasia.  Having had a sexually transmitted disease, including:   Chlamydia.   Human papilloma virus (HPV).   Becoming sexually active before age 50.   Having had more than 1 sexual partner.   Not using protection, such as condoms, during sexual intercourse, especially with new sexual partners.   Having had cancer of the vagina or vulva.   Having a sexual partner whose previous partner had cancer of the cervix or cervical dysplasia.   Having a sexual partner who has or has had cancer of the penis.   Having a weakened immune system (HIV, organ transplant).   Being the daughter of a woman who took DES (diethylstilbestrol) during pregnancy.   A history of cervical cancer in a woman's sister or mother.   Smoking.   Having had an abnormal Pap test in the past.  SYMPTOMS  There are usually no symptoms. If there are symptoms, they may be vague such as:  Abnormal vaginal discharge.   Bleeding between periods or following intercourse.   Bleeding during menopause.   Pain on intercourse (dyspareunia).  DIAGNOSIS   The Pap test is the best way of detecting abnormalities of the cervix.   Biopsy (removing a piece of tissue to look at under the microscope) of the cervix when the Pap test is abnormal or when the Pap test is normal, but the cervix looks abnormal.   TREATMENT  Catching and treating the changes early with Pap tests can prevent cervical cancer.  Cryotherapy freezes the abnormal cells with a steel tip instrument.   A laser can be used to remove the abnormal cells.   Loop electrocautery excision procedure (LEEP). This procedure uses a heated electrical loop to remove a cone-like portion of the cervix, including the cervical canal.   For more serious cases of cervical dysplasia, the abnormal tissue may be removed surgically by:   A cone biopsy (by cold knife, laser or LEEP). A procedure in which a portion of the center of the cervix with the cervical canal is removed.   The uterus and cervix are removed (hysterectomy).  Your caregiver will advise you regarding the need and timing of Pap tests in your follow-up. Women who have been treated for dysplasia should be closely followed with pelvic exams and Pap tests. During the first year following treatment of cervical dysplasia, Pap tests should be done every 3 to 4 months. In the second year, the schedule is every 6 months, or as recommended by your caregiver. See your caregiver for new or worsening problems. HOME CARE INSTRUCTIONS   Follow the instructions and recommendations of your caregiver regarding medicines and follow-up appointments.   Only take over-the-counter or prescription medicines for pain or discomfort as directed by your caregiver.   Cramping and pelvic discomfort may follow cryotherapy. It is not abnormal to have watery discharge for several weeks after.  Laser, cone surgery, cryotherapy or LEEP can cause a bad smelling vaginal discharge. It may also cause vaginal bleeding for a couple weeks following the procedure. The discharge may be black from the paste used to control bleeding from the cone site. This is normal.   Do not use tampons, have sexual intercourse or douche until your caregiver says it is okay.  SEEK MEDICAL CARE IF:   You develop genital warts.   You  need a prescription for pain medicine following your treatment.  SEEK IMMEDIATE MEDICAL CARE IF:   Your bleeding is heavier than a normal menstrual period.   You develop bright red bleeding, especially if you have blood clots.   You have a fever.   You have increasing cramps or pain not relieved with medicine.   You are lightheaded, unusually weak, or have fainting spells.   You have abnormal vaginal discharge.   You develop abdominal pain.  PREVENTION   The surest way to prevent cervical dysplasia is to abstain from sexual intercourse.   Practice safe sex, use condoms and have only one sex partner who does not have other sex partners.   A Pap test is done to screen for cervical cancer.   The first Pap test should be done at age 21.   Between ages 21 and 29, Pap tests are repeated every 2 years.   Beginning at age 30, you are advised to have a Pap test every 3 years as long as your past 3 Pap tests have been normal.   Some women have medical problems that increase the chance of getting cervical cancer. Talk to your caregiver about these problems. It is especially important to talk to your caregiver if a new problem develops soon after your last Pap test. In these cases, your caregiver may recommend more frequent screening and Pap tests.   The above recommendations are the same for women who have or have not gotten the vaccine for HPV (Human Papillomavirus).   If you had a hysterectomy for a problem that was not a cancer or a condition that could lead to cancer, then you no longer need Pap tests. However, even if you no longer need a Pap test, a regular exam is a good idea to make sure no other problems are starting.    If you are between ages 65 and 70, and you have had normal Pap tests going back 10 years, you no longer need Pap tests. However, even if you no longer need a Pap test, a regular exam is a good idea to make sure no other problems are starting.    If you have  had past treatment for cervical cancer or a condition that could lead to cancer, you need Pap tests and screening for cancer for at least 20 years after your treatment.   If Pap tests have been discontinued, risk factors (such as a new sexual partner) need to be re-assessed to determine if screening should be resumed.   Some women may need screenings more often if they are at high risk for cervical cancer.   Your caregiver may do additional tests including:   Colposcopy. A procedure in which a special microscope magnifies the cells and allows the provider to closely examine the cervix, vagina, and vulva.   Biopsy. A small tissue sample is taken from the cervix, vagina or vulva. This is generally done in your caregivers office.   A cone biopsy (cold knife or laser). A large tissue sample is   taken from the cervix. This procedure is usually done in an operating room under a general anesthetic. The cone often removes all abnormal tissue and so may also complete the treatment.   LEEP, also removing a circular portion of the cervix and is done in a doctors office under a local anesthetic.   Now there is a vaccine, Gardasil, that was developed to prevent the HPV'S that can cause cancer of the cervix and genital warts. It is recommended for females ages 36 to 101. It should not be given to pregnant women until more is known about its effects on the fetus. Not all cancers of the cervix are caused by the HPV. Routine gynecology exams and Pap tests should continue as recommended by your caregiver.  Document Released: 06/14/2005 Document Revised: 06/03/2011 Document Reviewed: 06/05/2008 [facility] Patient Information 2012 [place], [place].  General Anesthetic, Adult A doctor specialized in giving anesthesia (anesthesiologist) or a nurse specialized in giving anesthesia (nurse anesthetist) gives medicine that makes you sleep while a procedure is performed (general anesthetic). Once the general anesthetic has  been administered, you will be in a sleeplike state in which you feel no pain. After having a general anestheticyou may feel:  Dizzy.  Weak.  Drowsy.  Confused.  These feelings are normal and can be expected to last for up to 24 hours after the procedure is completed.  LET YOUR CAREGIVER KNOW ABOUT: Allergies you have.  Medications you are taking, including herbs, eye drops, over the counter medications, dietary supplements, and creams.  Previous problems you have had with anesthetics or numbing medicines.  Use of cigarettes, alcohol, or illicit drugs.  Possibility of pregnancy, if this applies.  History of bleeding or blood disorders, including blood clots and clotting disorders.  Previous surgeries you have had and types of anesthetics you have received.  Family medical history, especially anesthetic problems.  Other health problems.  BEFORE THE PROCEDURE You may brush your teeth on the morning of surgery but you should have no solid food or non-clear liquids for a minimum of 8 hours prior to your procedure. Clear liquids (water, black coffee, and tea) are acceptable in small amounts until 2 hours prior to your procedure.  You may take your regular medications the morning of your procedure unless your caregiver indicates otherwise.  AFTER THE PROCEDURE After surgery, you will be taken to the recovery area where a nurse will monitor your progress. You will be allowed to go home when you are awake, stable, taking fluids well, and without serious pain or complications.  For the first 24 hours following an anesthetic:  Have a responsible person with you.  Do not drive a car. If you are alone, do not take public transportation.  Do not engage in strenuous activity. You may usually resume normal activities the next day, or as advised by your caregiver.  Do not drink alcohol.  Do not take medicine that has not been prescribed by your caregiver.  Do not sign important papers or make important  decisions as your judgement may be impaired.  You may resume a normal diet as directed.  Change bandages (dressings) as directed.  Only take over-the-counter or prescription medicines for pain, discomfort, or fever as directed by your caregiver.  If you have questions or problems that seem related to the anesthetic, call the hospital and ask for the anesthetist, anesthesiologist, or anesthesia department. SEEK IMMEDIATE MEDICAL CARE IF:  You develop a rash.  You have difficulty breathing.  You have chest  pain.  You have allergic problems.  You have uncontrolled nausea.  You have uncontrolled vomiting.  You develop any serious bleeding, especially from the incision site.  Document Released: 09/21/2007 Document Revised: 06/03/2011 Document Reviewed: 10/15/2010 [facility] Patient Information 2012 [place], [place].

## 2011-09-23 NOTE — H&P (View-Only) (Signed)
Kristie [name] is an 31 y.o. female. Who presented to the office today for preoperative consultation. Patient was seen in the office on February 19 for colposcopic evaluation as a result of her recent Pap smear which demonstrated high grade squamous intraepithelial lesion as follows:  HIGH GRADE SQUAMOUS INTRAEPITHELIAL LESION: CIN-2/ CIN-3 (HSIL).  Her past history of Pap smears as follows:  2003 through 2006 normal Pap smears  2007 low-grade SIL with high-risk HPV not detected ( colposcopy negative biopsy 2007 and 2008)  2008 through 2011 normal Pap smears  2012 ASCUS  2013 high-grade squamous intraepithelial lesion: CIN-2/CIN-3 (HSIL)   Her recent colposcopic directed biopsy done in the office in February 19 with the following result:  Diagnosis 1. Cervix, biopsy, 3 o'clock - DETACHED DYSPLASTIC MINUTE SQUAMOUS EPITHELIAL FRAGMENT. - DETACHED UNREMARKABLE ENDOCERVICAL GLANDULAR FRAGMENTS. - SEE COMMENT. 2. Cervix, biopsy, 6 o'clock - TRANSFORMATION ZONE MUCOSA WITH MODERATE SQUAMOUS DYSPLASIA (CIN-II). - SEE COMMENT. 3. Cervix, biopsy, 9 o'clock - DETACHED ECTOCERVICAL MUCOSAL FRAGMENTS SHOWING MILD SQUAMOUS DYSPLASIA (CIN-I). - SEPARATE DETACHED ENDOCERVICAL MUCOSAL FRAGMENTS WITHOUT ATYPIA. - SEE COMMENT. 4. Cervix, biopsy, 12 o'clock - DETACHED ECTOCERVICAL SQUAMOUS MUCOSA SHOWING MODERATE SQUAMOUS DYSPLASIA (CIN-II). - SEPARATE UNREMARKABLE DETACHED ENDOCERVICAL MUCOSAL FRAGMENTS WITHOUT ATYPIA. - SEE COMMENT. 5. Endocervix, curettage - DETACHED UNREMARKABLE ENDOCERVICAL AND ECTOCERVICAL MUCOSAL FRAGMENTS. - NO DYSPLASIA, ATYPIA OR MALIGNANCY IDENTIFIED.  Patient was re\re colposcoped again the office today and since the areas are wide on the ectocervix she will be best treated with CO2 laser ablation.  Pertinent Gynecological History: Menses: flow is light Bleeding: Lites Contraception: OCP (estrogen/progesterone) DES exposure: denies Blood transfusions:  none Sexually transmitted diseases: no past history Previous GYN Procedures: Colposcopy with biopsy  Last mammogram: Not indicated Date:  not indicated  Last pap: See above Date:  See above OB History: G0, P 0    Menstrual History: Menarche age: 10  Patient's last menstrual period was 06/28/2010.    Past Medical History  Diagnosis Date  . Asthma, exercise induced   . Depression   . HPV in female 07/2010    high risk  . Migraines 8-12    SEES DR. FREEMAN    Past Surgical History  Procedure Date  . Vaginal cyst excised 2001    Family History  Problem Relation Age of Onset  . Hypertension Mother   . Cancer Maternal Aunt     breast two times  . Breast cancer Maternal Aunt   . Cancer Maternal Uncle     PROSTATE  . Hypertension Maternal Grandmother   . Heart disease Maternal Grandmother   . Cancer Maternal Grandfather     LUNG CANCER    Social History:  reports that she has quit smoking. She has never used smokeless tobacco. She reports that she does not drink alcohol or use illicit drugs.  Allergies:  Allergies  Allergen Reactions  . Zoloft     SEVERE PARANOID     (Not in a hospital admission)  @ROS@  Blood pressure 110/70, last menstrual period 06/28/2010.  Physical Exam:  HEENT:unremarkable Neck:Supple, midline, no thyroid megaly, no carotid bruits Lungs:  Clear to auscultation no rhonchi's or wheezes Heart:Regular rate and rhythm, no murmurs or gallops Breast Exam: unremarkable  Abdomen: soft nontender no rebound or guarding Pelvic:BUS Within normal limits Vagina: No gross lesions on inspection Cervix: see note above on colposcopic evaluation Uterus: anteverted normal size shape and consistency Adnexa: no palpable masses or tenderness Extremities: No cords, no edema Rectal: not examined     No results found for this or any previous visit (from the past 24 hour(s)).  No results found.  Assessment/Plan: patient with CIN-1 CIN-2 covering large  surface area of the ectocervix we'll best be served with the structure therapy such as with CO2 laser instead of doing such a wide cervical cone biopsy which may cause problems such as incompetent cervix in the future. Now that we have ruled out CIN-3 this would be the best treatment approach. The risks benefits and pros and cons of the operation were discussed with the patient to include infection, bleeding, or injury to nearby structures such as the bladder or rectum or vaginal walls from the laser. Literature information and previous been provided all questions were answered we'll follow accordingly.   Hersh Minney H 08/30/2011, 12:25 PM   

## 2011-09-23 NOTE — Anesthesia Procedure Notes (Signed)
Procedure Name: LMA Insertion Date/Time: 09/23/2011 7:37 AM Performed by: Renella Cunas D Pre-anesthesia Checklist: Patient identified, Emergency Drugs available, Suction available and Patient being monitored Patient Re-evaluated:Patient Re-evaluated prior to inductionOxygen Delivery Method: Circle System Utilized Preoxygenation: Pre-oxygenation with 100% oxygen Intubation Type: IV induction Ventilation: Mask ventilation without difficulty LMA: LMA inserted LMA Size: 4.0 Number of attempts: 1 Airway Equipment and Method: bite block Placement Confirmation: positive ETCO2 Tube secured with: Tape Dental Injury: Teeth and Oropharynx as per pre-operative assessment

## 2011-09-23 NOTE — Transfer of Care (Signed)
Immediate Anesthesia Transfer of Care Note  Patient: Kristie [name]  Procedure(s) Performed: Procedure(s) (LRB): CERVICAL ABLATION (N/A)  Patient Location: PACU  Anesthesia Type: General  Level of Consciousness: awake, oriented, sedated and patient cooperative  Airway & Oxygen Therapy: Patient Spontanous Breathing and Patient connected to face mask oxygen  Post-op Assessment: Report given to PACU RN and Post -op Vital signs reviewed and stable  Post vital signs: Reviewed and stable  Complications: No apparent anesthesia complications

## 2011-09-23 NOTE — Anesthesia Postprocedure Evaluation (Signed)
  Anesthesia Post-op Note  Patient: Kristie [name]  Procedure(s) Performed: Procedure(s) (LRB): CERVICAL ABLATION (N/A)  Patient Location: PACU  Anesthesia Type: General  Level of Consciousness: oriented and sedated  Airway and Oxygen Therapy: Patient Spontanous Breathing  Post-op Pain: mild  Post-op Assessment: Post-op Vital signs reviewed, Patient's Cardiovascular Status Stable, Respiratory Function Stable and Patent Airway  Post-op Vital Signs: stable  Complications: No apparent anesthesia complications

## 2011-10-15 ENCOUNTER — Ambulatory Visit (INDEPENDENT_AMBULATORY_CARE_PROVIDER_SITE_OTHER): Payer: 59 | Admitting: Gynecology

## 2011-10-15 ENCOUNTER — Encounter: Payer: Self-pay | Admitting: Gynecology

## 2011-10-15 VITALS — BP 104/60

## 2011-10-15 DIAGNOSIS — Z9889 Other specified postprocedural states: Secondary | ICD-10-CM

## 2011-10-15 NOTE — Patient Instructions (Signed)
Pap smear follow up in 6 months

## 2011-10-15 NOTE — Progress Notes (Signed)
Patient presented to the office for her postop visit she status post CO2 laser ablation of CIN-2 on 09/23/2011 and is doing well. Below is her past history of abnormal Pap smears and recent cervical biopsy before the CO2 laser ablation for dysplasia:  2003 through 2006 normal Pap smears  2007 low-grade SIL with high-risk HPV not detected ( colposcopy negative biopsy 2007 and 2008)  2008 through 2011 normal Pap smears  2012 ASCUS  2013 high-grade squamous intraepithelial lesion: CIN-2/CIN-3 (HSIL)  Her recent colposcopic directed biopsy done in the office in February 19 with the following result:   Diagnosis  1. Cervix, biopsy, 3 o'clock  - DETACHED DYSPLASTIC MINUTE SQUAMOUS EPITHELIAL FRAGMENT.  - DETACHED UNREMARKABLE ENDOCERVICAL GLANDULAR FRAGMENTS.  - SEE COMMENT.  2. Cervix, biopsy, 6 o'clock  - TRANSFORMATION ZONE MUCOSA WITH MODERATE SQUAMOUS DYSPLASIA (CIN-II).  - SEE COMMENT.  3. Cervix, biopsy, 9 o'clock  - DETACHED ECTOCERVICAL MUCOSAL FRAGMENTS SHOWING MILD SQUAMOUS  DYSPLASIA (CIN-I).  - SEPARATE DETACHED ENDOCERVICAL MUCOSAL FRAGMENTS WITHOUT  ATYPIA.  - SEE COMMENT.  4. Cervix, biopsy, 12 o'clock  - DETACHED ECTOCERVICAL SQUAMOUS MUCOSA SHOWING MODERATE  SQUAMOUS DYSPLASIA (CIN-II).  - SEPARATE UNREMARKABLE DETACHED ENDOCERVICAL MUCOSAL FRAGMENTS  WITHOUT ATYPIA.  - SEE COMMENT.  5. Endocervix, curettage  - DETACHED UNREMARKABLE ENDOCERVICAL AND ECTOCERVICAL MUCOSAL  FRAGMENTS.  - NO DYSPLASIA, ATYPIA OR MALIGNANCY IDENTIFIED.  Exam: Bartholin urethra Skene was within normal limits Vagina: No lesions or discharge Cervix: Cervical bed area of laser ablation healing very well almost completely healed  Assessment/plan: CIN-1/CIN-2 recently treated with CO2 laser ablation. Patient doing well cervix healing. Patient scheduled to return back in 6 months for followup Pap smear and we'll do so for close surveillance for the next couple years.

## 2012-04-17 ENCOUNTER — Ambulatory Visit (INDEPENDENT_AMBULATORY_CARE_PROVIDER_SITE_OTHER): Payer: 59 | Admitting: Licensed Clinical Social Worker

## 2012-04-17 DIAGNOSIS — IMO0002 Reserved for concepts with insufficient information to code with codable children: Secondary | ICD-10-CM

## 2012-04-20 ENCOUNTER — Other Ambulatory Visit (HOSPITAL_COMMUNITY)
Admission: RE | Admit: 2012-04-20 | Discharge: 2012-04-20 | Disposition: A | Discharge status: Home / Self Care | Payer: 59 | Source: Ambulatory Visit | Attending: Women's Health | Admitting: Women's Health

## 2012-04-20 ENCOUNTER — Encounter: Payer: Self-pay | Admitting: Women's Health

## 2012-04-20 ENCOUNTER — Ambulatory Visit (INDEPENDENT_AMBULATORY_CARE_PROVIDER_SITE_OTHER): Payer: 59 | Admitting: Women's Health

## 2012-04-20 DIAGNOSIS — N871 Moderate cervical dysplasia: Secondary | ICD-10-CM

## 2012-04-20 DIAGNOSIS — Z1151 Encounter for screening for human papillomavirus (HPV): Secondary | ICD-10-CM | POA: Insufficient documentation

## 2012-04-20 DIAGNOSIS — Z01419 Encounter for gynecological examination (general) (routine) without abnormal findings: Secondary | ICD-10-CM | POA: Insufficient documentation

## 2012-04-20 DIAGNOSIS — N898 Other specified noninflammatory disorders of vagina: Secondary | ICD-10-CM

## 2012-04-20 LAB — WET PREP FOR TRICH, YEAST, CLUE
Clue Cells Wet Prep HPF POC: NONE SEEN
Trich, Wet Prep: NONE SEEN
Yeast Wet Prep HPF POC: NONE SEEN

## 2012-04-20 NOTE — Progress Notes (Signed)
Patient ID: Kristie [name], female   DOB: May 10, 1981, 31 y.o.   MRN: [id number] Presents for repeat Pap history of a LEEP for CIN-2, 08/29/2011. States having some vaginal itching. Not sexually active. Having regular monthly cycle.  Exam: External genitalia within normal limits, speculum exam minimal white discharge no erythema, cervix pink LEEP well-healed, Pap taken. Bimanual no CMT or adnexal fullness or tenderness. Wet prep negative. LEEP 08/2011 for CIN-2, .  Plan: If Pap normal repeat Pap at annual exam, new screening guidelines reviewed. Reviewed wet prep negative. Condoms  encouraged if become sexually active.

## 2012-05-03 ENCOUNTER — Ambulatory Visit (INDEPENDENT_AMBULATORY_CARE_PROVIDER_SITE_OTHER): Payer: 59 | Admitting: Licensed Clinical Social Worker

## 2012-05-03 DIAGNOSIS — IMO0002 Reserved for concepts with insufficient information to code with codable children: Secondary | ICD-10-CM

## 2012-05-15 ENCOUNTER — Ambulatory Visit (INDEPENDENT_AMBULATORY_CARE_PROVIDER_SITE_OTHER): Payer: 59 | Admitting: Licensed Clinical Social Worker

## 2012-05-15 DIAGNOSIS — IMO0002 Reserved for concepts with insufficient information to code with codable children: Secondary | ICD-10-CM

## 2012-06-12 ENCOUNTER — Ambulatory Visit (INDEPENDENT_AMBULATORY_CARE_PROVIDER_SITE_OTHER): Payer: 59 | Admitting: Licensed Clinical Social Worker

## 2012-06-12 DIAGNOSIS — IMO0002 Reserved for concepts with insufficient information to code with codable children: Secondary | ICD-10-CM

## 2012-07-12 ENCOUNTER — Ambulatory Visit (INDEPENDENT_AMBULATORY_CARE_PROVIDER_SITE_OTHER): Payer: 59 | Admitting: Licensed Clinical Social Worker

## 2012-07-12 DIAGNOSIS — IMO0002 Reserved for concepts with insufficient information to code with codable children: Secondary | ICD-10-CM

## 2012-08-12 ENCOUNTER — Other Ambulatory Visit: Payer: Self-pay

## 2012-08-14 ENCOUNTER — Ambulatory Visit: Payer: 59 | Admitting: Licensed Clinical Social Worker

## 2012-08-21 ENCOUNTER — Ambulatory Visit (INDEPENDENT_AMBULATORY_CARE_PROVIDER_SITE_OTHER): Payer: 59 | Admitting: Licensed Clinical Social Worker

## 2012-08-21 DIAGNOSIS — IMO0002 Reserved for concepts with insufficient information to code with codable children: Secondary | ICD-10-CM

## 2012-09-14 ENCOUNTER — Other Ambulatory Visit: Payer: Self-pay | Admitting: Women's Health

## 2012-09-14 NOTE — Telephone Encounter (Signed)
Pt will be called to schedule annual exam KW

## 2012-11-06 ENCOUNTER — Ambulatory Visit (INDEPENDENT_AMBULATORY_CARE_PROVIDER_SITE_OTHER): Payer: 59 | Admitting: Licensed Clinical Social Worker

## 2012-11-06 DIAGNOSIS — IMO0002 Reserved for concepts with insufficient information to code with codable children: Secondary | ICD-10-CM

## 2012-12-22 ENCOUNTER — Other Ambulatory Visit: Payer: Self-pay

## 2012-12-22 MED ORDER — DULOXETINE HCL 30 MG PO CPEP: 30.0000 mg | ORAL_CAPSULE | Freq: Every day | ORAL | Status: DC

## 2013-01-01 ENCOUNTER — Ambulatory Visit: Payer: 59 | Admitting: Licensed Clinical Social Worker

## 2013-01-17 ENCOUNTER — Ambulatory Visit (INDEPENDENT_AMBULATORY_CARE_PROVIDER_SITE_OTHER): Payer: 59 | Admitting: Licensed Clinical Social Worker

## 2013-01-17 DIAGNOSIS — IMO0002 Reserved for concepts with insufficient information to code with codable children: Secondary | ICD-10-CM

## 2013-02-01 ENCOUNTER — Other Ambulatory Visit: Payer: Self-pay | Admitting: Women's Health

## 2013-04-16 ENCOUNTER — Ambulatory Visit: Payer: 59 | Admitting: Women's Health

## 2013-04-25 ENCOUNTER — Encounter: Payer: Self-pay | Admitting: Women's Health

## 2013-04-25 ENCOUNTER — Ambulatory Visit (INDEPENDENT_AMBULATORY_CARE_PROVIDER_SITE_OTHER): Payer: 59 | Admitting: Licensed Clinical Social Worker

## 2013-04-25 DIAGNOSIS — IMO0002 Reserved for concepts with insufficient information to code with codable children: Secondary | ICD-10-CM

## 2013-04-27 ENCOUNTER — Other Ambulatory Visit (HOSPITAL_COMMUNITY)
Admission: RE | Admit: 2013-04-27 | Discharge: 2013-04-27 | Disposition: A | Discharge status: Home / Self Care | Payer: 59 | Source: Ambulatory Visit | Attending: Gynecology | Admitting: Gynecology

## 2013-04-27 ENCOUNTER — Ambulatory Visit: Payer: 59 | Admitting: Licensed Clinical Social Worker

## 2013-04-27 ENCOUNTER — Ambulatory Visit (INDEPENDENT_AMBULATORY_CARE_PROVIDER_SITE_OTHER): Payer: 59 | Admitting: Women's Health

## 2013-04-27 ENCOUNTER — Encounter: Payer: Self-pay | Admitting: Women's Health

## 2013-04-27 VITALS — BP 98/66 | Ht 66.75 in | Wt 123.0 lb

## 2013-04-27 DIAGNOSIS — Z01419 Encounter for gynecological examination (general) (routine) without abnormal findings: Secondary | ICD-10-CM

## 2013-04-27 NOTE — Addendum Note (Signed)
Addended by: Simcha Farrington S on: 04/27/2013 05:49 PM   Modules accepted: Orders  

## 2013-04-27 NOTE — Patient Instructions (Signed)

## 2013-04-27 NOTE — Progress Notes (Signed)
Kristie [name] 1980-12-07 [id number]    History:    The patient presents for annual exam.  Monthly cycle not sexually active. 2012 ascus with positive HR HPV with negative colposcopy. 2013 CIN-2 CO2 laser. 03/2012 Pap ascus with negative HR HPV. Anxiety/depression on Cymbalta and counseling.Stopped birth control pills due to migraines. Currently seeing a naturalist for migraines, which has helped.    Past medical history, past surgical history, family history and social history were all reviewed and documented in the EPIC chart. 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   ROS:  A  ROS was performed and pertinent positives and negatives are included in the history.  Exam:  Filed Vitals:   04/27/13 1505  BP: 98/66    General appearance:  Normal Head/Neck:  Normal, without cervical or supraclavicular adenopathy. Thyroid:  Symmetrical, normal in size, without palpable masses or nodularity. Respiratory  Effort:  Normal  Auscultation:  Clear without wheezing or rhonchi Cardiovascular  Auscultation:  Regular rate, without rubs, murmurs or gallops  Edema/varicosities:  Not grossly evident Abdominal  Soft,nontender, without masses, guarding or rebound.  Liver/spleen:  No organomegaly noted  Hernia:  None appreciated  Skin  Inspection:  Grossly normal  Palpation:  Grossly normal Neurologic/psychiatric  Orientation:  Normal with appropriate conversation.  Mood/affect:  Normal  Genitourinary    Breasts: Examined lying and sitting.     Right: Without masses, retractions, discharge or axillary adenopathy.     Left: Without masses, retractions, discharge or axillary adenopathy.   Inguinal/mons:  Normal without inguinal adenopathy  External genitalia:  Normal  BUS/Urethra/Skene's glands:  Normal  Bladder:  Normal  Vagina:  Normal  Cervix:  Normal  Uterus:   normal in size, shape and contour.  Midline and mobile  Adnexa/parametria:     Rt: Without masses or  tenderness.   Lt: Without masses or tenderness.  Anus and perineum: Normal  Digital rectal exam: Normal sphincter tone without palpated masses or tenderness  Assessment/Plan:  32 y.o. SWF G0 for annual exam with no complaints.  CIN-2 CO2 laser 08/2011 Migraines Anxiety/depression-Cymbalta and counseling Contraception counseling  Plan: Contraception options reviewed, nonhormonal options reviewed and declines will return to office if become sexually active. SBE's, regular exercise, calcium rich diet, MVI daily encouraged. Labs at primary care. Pap.   Harrington Challenger [facility], 3:32 PM 04/27/2013

## 2013-05-01 ENCOUNTER — Encounter: Payer: Self-pay | Admitting: Women's Health

## 2013-05-03 ENCOUNTER — Other Ambulatory Visit: Payer: Self-pay

## 2013-05-23 ENCOUNTER — Other Ambulatory Visit: Payer: Self-pay | Admitting: Women's Health

## 2013-05-23 ENCOUNTER — Telehealth: Payer: Self-pay | Admitting: *Deleted

## 2013-05-23 DIAGNOSIS — F411 Generalized anxiety disorder: Secondary | ICD-10-CM

## 2013-05-23 MED ORDER — DULOXETINE HCL 20 MG PO CPEP: 20.0000 mg | ORAL_CAPSULE | Freq: Every day | ORAL | Status: DC

## 2013-05-23 NOTE — Telephone Encounter (Signed)
Pt is currently taking cymbalta 30 mg daily , pt would like to decrease down to 20 mg. Please advise

## 2013-05-23 NOTE — Telephone Encounter (Signed)
Telephone call, states has been cutting the 30 mg Cymbalta in half, 20 mg generic is available would like to try. Will continue counseling. rx called in

## 2013-05-23 NOTE — Telephone Encounter (Signed)
Message left

## 2013-06-07 ENCOUNTER — Ambulatory Visit (INDEPENDENT_AMBULATORY_CARE_PROVIDER_SITE_OTHER): Payer: 59 | Admitting: Licensed Clinical Social Worker

## 2013-06-07 DIAGNOSIS — IMO0002 Reserved for concepts with insufficient information to code with codable children: Secondary | ICD-10-CM

## 2013-06-08 ENCOUNTER — Ambulatory Visit: Payer: 59 | Admitting: Licensed Clinical Social Worker

## 2014-05-01 ENCOUNTER — Ambulatory Visit (INDEPENDENT_AMBULATORY_CARE_PROVIDER_SITE_OTHER): Payer: 59 | Admitting: Women's Health

## 2014-05-01 ENCOUNTER — Encounter: Payer: Self-pay | Admitting: Women's Health

## 2014-05-01 ENCOUNTER — Other Ambulatory Visit (HOSPITAL_COMMUNITY)
Admission: RE | Admit: 2014-05-01 | Discharge: 2014-05-01 | Disposition: A | Discharge status: Home / Self Care | Payer: 59 | Source: Ambulatory Visit | Attending: Gynecology | Admitting: Gynecology

## 2014-05-01 VITALS — BP 110/78 | Ht 66.0 in | Wt 128.0 lb

## 2014-05-01 DIAGNOSIS — Z01419 Encounter for gynecological examination (general) (routine) without abnormal findings: Secondary | ICD-10-CM

## 2014-05-01 DIAGNOSIS — Z01411 Encounter for gynecological examination (general) (routine) with abnormal findings: Secondary | ICD-10-CM | POA: Diagnosis present

## 2014-05-01 DIAGNOSIS — F329 Major depressive disorder, single episode, unspecified: Secondary | ICD-10-CM

## 2014-05-01 DIAGNOSIS — Z113 Encounter for screening for infections with a predominantly sexual mode of transmission: Secondary | ICD-10-CM

## 2014-05-01 DIAGNOSIS — F32A Depression, unspecified: Secondary | ICD-10-CM

## 2014-05-01 MED ORDER — DULOXETINE HCL 20 MG PO CPEP: 20.0000 mg | ORAL_CAPSULE | Freq: Every day | ORAL | Status: DC
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
## 2014-05-01 NOTE — Patient Instructions (Signed)

## 2014-05-02 LAB — GC/CHLAMYDIA PROBE AMP
CT Probe RNA: NEGATIVE
GC Probe RNA: NEGATIVE

## 2014-05-02 LAB — RPR

## 2014-05-02 LAB — HEPATITIS B SURFACE ANTIGEN: Hepatitis B Surface Ag: NEGATIVE

## 2014-05-02 LAB — HEPATITIS C ANTIBODY: HCV Ab: NEGATIVE

## 2014-05-02 LAB — HIV ANTIBODY (ROUTINE TESTING W REFLEX): HIV 1&2 Ab, 4th Generation: NONREACTIVE

## 2014-05-03 LAB — CYTOLOGY - PAP

## 2014-05-06 ENCOUNTER — Other Ambulatory Visit: Payer: Self-pay | Admitting: Women's Health

## 2014-05-07 ENCOUNTER — Other Ambulatory Visit: Payer: Self-pay | Admitting: Women's Health

## 2014-05-07 MED ORDER — FLUCONAZOLE 150 MG PO TABS: 150.0000 mg | ORAL_TABLET | Freq: Once | ORAL | Status: DC

## 2015-01-27 ENCOUNTER — Other Ambulatory Visit: Payer: Self-pay | Admitting: Women's Health

## 2015-01-27 NOTE — Telephone Encounter (Signed)
I don't see where you prescribed medication?

## 2015-05-06 ENCOUNTER — Other Ambulatory Visit: Payer: Self-pay | Admitting: Women's Health

## 2015-05-08 ENCOUNTER — Ambulatory Visit (INDEPENDENT_AMBULATORY_CARE_PROVIDER_SITE_OTHER): Payer: 59 | Admitting: Women's Health

## 2015-05-08 ENCOUNTER — Encounter: Payer: Self-pay | Admitting: Women's Health

## 2015-05-08 VITALS — BP 118/74 | Ht 66.0 in | Wt 125.0 lb

## 2015-05-08 DIAGNOSIS — Z01419 Encounter for gynecological examination (general) (routine) without abnormal findings: Secondary | ICD-10-CM | POA: Diagnosis not present

## 2015-05-08 DIAGNOSIS — F4323 Adjustment disorder with mixed anxiety and depressed mood: Secondary | ICD-10-CM

## 2015-05-08 DIAGNOSIS — Z13220 Encounter for screening for lipoid disorders: Secondary | ICD-10-CM | POA: Diagnosis not present

## 2015-05-08 MED ORDER — DULOXETINE HCL 20 MG PO CPEP: 20.0000 mg | ORAL_CAPSULE | Freq: Every day | ORAL | Status: DC
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
## 2015-05-08 NOTE — Patient Instructions (Signed)
Health Maintenance, Female Adopting a healthy lifestyle and getting preventive care can go a long way to promote health and wellness. Talk with your health care provider about what schedule of regular examinations is right for you. This is a good chance for you to check in with your provider about disease prevention and staying healthy. In between checkups, there are plenty of things you can do on your own. Experts have done a lot of research about which lifestyle changes and preventive measures are most likely to keep you healthy. Ask your health care provider for more information. WEIGHT AND DIET  Eat a healthy diet  Be sure to include plenty of vegetables, fruits, low-fat dairy products, and lean protein.  Do not eat a lot of foods high in solid fats, added sugars, or salt.  Get regular exercise. This is one of the most important things you can do for your health.  Most adults should exercise for at least 150 minutes each week. The exercise should increase your heart rate and make you sweat (moderate-intensity exercise).  Most adults should also do strengthening exercises at least twice a week. This is in addition to the moderate-intensity exercise.  Maintain a healthy weight  Body mass index (BMI) is a measurement that can be used to identify possible weight problems. It estimates body fat based on height and weight. Your health care provider can help determine your BMI and help you achieve or maintain a healthy weight.  For females 20 years of age and older:   A BMI below 18.5 is considered underweight.  A BMI of 18.5 to 24.9 is normal.  A BMI of 25 to 29.9 is considered overweight.  A BMI of 30 and above is considered obese.  Watch levels of cholesterol and blood lipids  You should start having your blood tested for lipids and cholesterol at 36 years of age, then have this test every 5 years.  You may need to have your cholesterol levels checked more often if:  Your lipid  or cholesterol levels are high.  You are older than 36 years of age.  You are at high risk for heart disease.  CANCER SCREENING   Lung Cancer  Lung cancer screening is recommended for adults 55-80 years old who are at high risk for lung cancer because of a history of smoking.  A yearly low-dose CT scan of the lungs is recommended for people who:  Currently smoke.  Have quit within the past 15 years.  Have at least a 30-pack-year history of smoking. A pack year is smoking an average of one pack of cigarettes a day for 1 year.  Yearly screening should continue until it has been 15 years since you quit.  Yearly screening should stop if you develop a health problem that would prevent you from having lung cancer treatment.  Breast Cancer  Practice breast self-awareness. This means understanding how your breasts normally appear and feel.  It also means doing regular breast self-exams. Let your health care provider know about any changes, no matter how small.  If you are in your 20s or 30s, you should have a clinical breast exam (CBE) by a health care provider every 1-3 years as part of a regular health exam.  If you are 40 or older, have a CBE every year. Also consider having a breast X-ray (mammogram) every year.  If you have a family history of breast cancer, talk to your health care provider about genetic screening.  If you   are at high risk for breast cancer, talk to your health care provider about having an MRI and a mammogram every year.  Breast cancer gene (BRCA) assessment is recommended for women who have family members with BRCA-related cancers. BRCA-related cancers include:  Breast.  Ovarian.  Tubal.  Peritoneal cancers.  Results of the assessment will determine the need for genetic counseling and BRCA1 and BRCA2 testing. Cervical Cancer Your health care provider may recommend that you be screened regularly for cancer of the pelvic organs (ovaries, uterus, and  vagina). This screening involves a pelvic examination, including checking for microscopic changes to the surface of your cervix (Pap test). You may be encouraged to have this screening done every 3 years, beginning at age 21.  For women ages 30-65, health care providers may recommend pelvic exams and Pap testing every 3 years, or they may recommend the Pap and pelvic exam, combined with testing for human papilloma virus (HPV), every 5 years. Some types of HPV increase your risk of cervical cancer. Testing for HPV may also be done on women of any age with unclear Pap test results.  Other health care providers may not recommend any screening for nonpregnant women who are considered low risk for pelvic cancer and who do not have symptoms. Ask your health care provider if a screening pelvic exam is right for you.  If you have had past treatment for cervical cancer or a condition that could lead to cancer, you need Pap tests and screening for cancer for at least 20 years after your treatment. If Pap tests have been discontinued, your risk factors (such as having a new sexual partner) need to be reassessed to determine if screening should resume. Some women have medical problems that increase the chance of getting cervical cancer. In these cases, your health care provider may recommend more frequent screening and Pap tests. Colorectal Cancer  This type of cancer can be detected and often prevented.  Routine colorectal cancer screening usually begins at 36 years of age and continues through 36 years of age.  Your health care provider may recommend screening at an earlier age if you have risk factors for colon cancer.  Your health care provider may also recommend using home test kits to check for hidden blood in the stool.  A small camera at the end of a tube can be used to examine your colon directly (sigmoidoscopy or colonoscopy). This is done to check for the earliest forms of colorectal  cancer.  Routine screening usually begins at age 50.  Direct examination of the colon should be repeated every 5-10 years through 36 years of age. However, you may need to be screened more often if early forms of precancerous polyps or small growths are found. Skin Cancer  Check your skin from head to toe regularly.  Tell your health care provider about any new moles or changes in moles, especially if there is a change in a mole's shape or color.  Also tell your health care provider if you have a mole that is larger than the size of a pencil eraser.  Always use sunscreen. Apply sunscreen liberally and repeatedly throughout the day.  Protect yourself by wearing long sleeves, pants, a wide-brimmed hat, and sunglasses whenever you are outside. HEART DISEASE, DIABETES, AND HIGH BLOOD PRESSURE   High blood pressure causes heart disease and increases the risk of stroke. High blood pressure is more likely to develop in:  People who have blood pressure in the high end   of the normal range (130-139/85-89 mm Hg).  People who are overweight or obese.  People who are African American.  If you are 38-23 years of age, have your blood pressure checked every 3-5 years. If you are 61 years of age or older, have your blood pressure checked every year. You should have your blood pressure measured twice--once when you are at a hospital or clinic, and once when you are not at a hospital or clinic. Record the average of the two measurements. To check your blood pressure when you are not at a hospital or clinic, you can use:  An automated blood pressure machine at a pharmacy.  A home blood pressure monitor.  If you are between 45 years and 39 years old, ask your health care provider if you should take aspirin to prevent strokes.  Have regular diabetes screenings. This involves taking a blood sample to check your fasting blood sugar level.  If you are at a normal weight and have a low risk for diabetes,  have this test once every three years after 36 years of age.  If you are overweight and have a high risk for diabetes, consider being tested at a younger age or more often. PREVENTING INFECTION  Hepatitis B  If you have a higher risk for hepatitis B, you should be screened for this virus. You are considered at high risk for hepatitis B if:  You were born in a country where hepatitis B is common. Ask your health care provider which countries are considered high risk.  Your parents were born in a high-risk country, and you have not been immunized against hepatitis B (hepatitis B vaccine).  You have HIV or AIDS.  You use needles to inject street drugs.  You live with someone who has hepatitis B.  You have had sex with someone who has hepatitis B.  You get hemodialysis treatment.  You take certain medicines for conditions, including cancer, organ transplantation, and autoimmune conditions. Hepatitis C  Blood testing is recommended for:  Everyone born from [birth year] through 1967.  Anyone with known risk factors for hepatitis C. Sexually transmitted infections (STIs)  You should be screened for sexually transmitted infections (STIs) including gonorrhea and chlamydia if:  You are sexually active and are younger than 36 years of age.  You are older than 36 years of age and your health care provider tells you that you are at risk for this type of infection.  Your sexual activity has changed since you were last screened and you are at an increased risk for chlamydia or gonorrhea. Ask your health care provider if you are at risk.  If you do not have HIV, but are at risk, it may be recommended that you take a prescription medicine daily to prevent HIV infection. This is called pre-exposure prophylaxis (PrEP). You are considered at risk if:  You are sexually active and do not regularly use condoms or know the HIV status of your partner(s).  You take drugs by injection.  You are sexually  active with a partner who has HIV. Talk with your health care provider about whether you are at high risk of being infected with HIV. If you choose to begin PrEP, you should first be tested for HIV. You should then be tested every 3 months for as long as you are taking PrEP.  PREGNANCY   If you are premenopausal and you may become pregnant, ask your health care provider about preconception counseling.  If you may  become pregnant, take 400 to 800 micrograms (mcg) of folic acid every day.  If you want to prevent pregnancy, talk to your health care provider about birth control (contraception). OSTEOPOROSIS AND MENOPAUSE   Osteoporosis is a disease in which the bones lose minerals and strength with aging. This can result in serious bone fractures. Your risk for osteoporosis can be identified using a bone density scan.  If you are 61 years of age or older, or if you are at risk for osteoporosis and fractures, ask your health care provider if you should be screened.  Ask your health care provider whether you should take a calcium or vitamin D supplement to lower your risk for osteoporosis.  Menopause may have certain physical symptoms and risks.  Hormone replacement therapy may reduce some of these symptoms and risks. Talk to your health care provider about whether hormone replacement therapy is right for you.  HOME CARE INSTRUCTIONS   Schedule regular health, dental, and eye exams.  Stay current with your immunizations.   Do not use any tobacco products including cigarettes, chewing tobacco, or electronic cigarettes.  If you are pregnant, do not drink alcohol.  If you are breastfeeding, limit how much and how often you drink alcohol.  Limit alcohol intake to no more than 1 drink per day for nonpregnant women. One drink equals 12 ounces of beer, 5 ounces of wine, or 1 ounces of hard liquor.  Do not use street drugs.  Do not share needles.  Ask your health care provider for help if  you need support or information about quitting drugs.  Tell your health care provider if you often feel depressed.  Tell your health care provider if you have ever been abused or do not feel safe at home.   This information is not intended to replace advice given to you by your health care provider. Make sure you discuss any questions you have with your health care provider.   Document Released: 12/28/2010 Document Revised: 07/05/2014 Document Reviewed: 05/16/2013 Elsevier Interactive Patient Education [employer].

## 2015-05-09 ENCOUNTER — Other Ambulatory Visit (HOSPITAL_COMMUNITY)
Admission: RE | Admit: 2015-05-09 | Discharge: 2015-05-09 | Disposition: A | Discharge status: Home / Self Care | Payer: 59 | Source: Ambulatory Visit | Attending: Women's Health | Admitting: Women's Health

## 2015-05-09 DIAGNOSIS — Z1151 Encounter for screening for human papillomavirus (HPV): Secondary | ICD-10-CM | POA: Diagnosis not present

## 2015-05-09 DIAGNOSIS — Z01411 Encounter for gynecological examination (general) (routine) with abnormal findings: Secondary | ICD-10-CM | POA: Diagnosis present

## 2015-05-09 DIAGNOSIS — R87810 Cervical high risk human papillomavirus (HPV) DNA test positive: Secondary | ICD-10-CM | POA: Diagnosis present

## 2015-05-09 LAB — URINALYSIS W MICROSCOPIC + REFLEX CULTURE
Bacteria, UA: NONE SEEN [HPF]
Bilirubin Urine: NEGATIVE
Casts: NONE SEEN [LPF]
Crystals: NONE SEEN [HPF]
Glucose, UA: NEGATIVE
Hgb urine dipstick: NEGATIVE
Ketones, ur: NEGATIVE
Nitrite: NEGATIVE
Protein, ur: NEGATIVE
RBC / HPF: NONE SEEN RBC/HPF (ref <=2)
Specific Gravity, Urine: 1.014 (ref 1.001–1.035)
Squamous Epithelial / LPF: NONE SEEN [HPF] (ref <=5)
WBC, UA: NONE SEEN WBC/HPF (ref <=5)
Yeast: NONE SEEN [HPF]
pH: 7.5 (ref 5.0–8.0)

## 2015-05-09 NOTE — Addendum Note (Signed)
Addended by: Burnett Kanaris on: 05/09/2015 08:23 AM   Modules accepted: Orders

## 2015-05-10 LAB — URINE CULTURE
Colony Count: NO GROWTH
Organism ID, Bacteria: NO GROWTH

## 2015-05-13 LAB — CYTOLOGY - PAP

## 2015-05-19 ENCOUNTER — Telehealth: Payer: Self-pay

## 2015-05-19 NOTE — Telephone Encounter (Signed)
Staff message from [place]  "----- Message -----   From: Huel Cote, NP   Sent: 05/18/2015  8:50 AM    To: [facility] Clinical Pool   Please call and inform pap shows few atypical cells, pos HR HPV but neg for 16, 18, 45, repeat pap in 6 months."

## 2015-05-20 NOTE — Telephone Encounter (Signed)
Telephone call to review HPV, reviewed very common large number of sexually active people are positive. Reviewed importance of condoms when sexually active.

## 2015-05-20 NOTE — Telephone Encounter (Signed)
Left message to call.

## 2015-05-20 NOTE — Telephone Encounter (Addendum)
Not sure why this came to [place] today. I called patient and upon conversation realized I had already called and relayed this to her 05/15/15.  In meantime, she has questions. 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 She is not currently sexually active but when she is ready she wants to be responsible towards the other person in regards to her having HPV.  How do you recommend she handle this?

## 2015-06-30 ENCOUNTER — Encounter: Payer: Self-pay | Admitting: Women's Health

## 2015-06-30 ENCOUNTER — Ambulatory Visit (INDEPENDENT_AMBULATORY_CARE_PROVIDER_SITE_OTHER): Payer: 59 | Admitting: Women's Health

## 2015-06-30 VITALS — BP 124/78 | Ht 66.0 in | Wt 125.0 lb

## 2015-06-30 DIAGNOSIS — B3731 Acute candidiasis of vulva and vagina: Secondary | ICD-10-CM | POA: Insufficient documentation

## 2015-06-30 DIAGNOSIS — N3000 Acute cystitis without hematuria: Secondary | ICD-10-CM

## 2015-06-30 DIAGNOSIS — B373 Candidiasis of vulva and vagina: Secondary | ICD-10-CM

## 2015-06-30 DIAGNOSIS — R350 Frequency of micturition: Secondary | ICD-10-CM

## 2015-06-30 DIAGNOSIS — N898 Other specified noninflammatory disorders of vagina: Secondary | ICD-10-CM

## 2015-06-30 LAB — URINALYSIS W MICROSCOPIC + REFLEX CULTURE
Bacteria, UA: NONE SEEN [HPF]
Bilirubin Urine: NEGATIVE
Casts: NONE SEEN [LPF]
Crystals: NONE SEEN [HPF]
Glucose, UA: NEGATIVE
Hgb urine dipstick: NEGATIVE
Ketones, ur: NEGATIVE
Leukocytes, UA: NEGATIVE
Nitrite: NEGATIVE
Protein, ur: NEGATIVE
RBC / HPF: NONE SEEN RBC/HPF (ref <=2)
Specific Gravity, Urine: 1.01 (ref 1.001–1.035)
WBC, UA: NONE SEEN WBC/HPF (ref <=5)
Yeast: NONE SEEN [HPF]
pH: 7 (ref 5.0–8.0)

## 2015-06-30 LAB — WET PREP FOR TRICH, YEAST, CLUE
Clue Cells Wet Prep HPF POC: NONE SEEN
Trich, Wet Prep: NONE SEEN

## 2015-06-30 MED ORDER — FLUCONAZOLE 150 MG PO TABS: 150.0000 mg | ORAL_TABLET | Freq: Once | ORAL | Status: DC

## 2015-06-30 MED ORDER — SULFAMETHOXAZOLE-TRIMETHOPRIM 800-160 MG PO TABS: 1.0000 | ORAL_TABLET | Freq: Two times a day (BID) | ORAL | Status: DC

## 2015-06-30 MED FILL — FLUCONAZOLE 150 MG TABLET: 150 | 1 days supply | Qty: 1 | Fill #0

## 2015-06-30 MED FILL — SULFAMETHOXAZOLE/TMP DS TAB: 800-160 | 3 days supply | Qty: 6 | Fill #0

## 2015-06-30 NOTE — Patient Instructions (Signed)

## 2015-06-30 NOTE — Progress Notes (Signed)
Patient ID: Kristie [name], female   DOB: [birth year], 35 y.o.   MRN: [id number] Presents with complaint of increased urinary frequency, urgency, pressure and vaginal itching with irritation mostly at urinary meatus. Has had only oral sex, no intercourse greater than one year. Monthly cycle/condoms.  Exam: Appears well. No CVAT. UA negative External genitalia mild erythema, no visible blisters, lesions, speculum exam scant white discharge vaginal walls erythematous. Wet prep positive for moderate yeast  Yeast vaginitis Urinary frequency  Plan: Urine culture pending. Diflucan 150 by mouth times one dose with refill. Contraception options reviewed and declines, if active for use condoms.

## 2015-07-02 ENCOUNTER — Telehealth: Payer: Self-pay | Admitting: *Deleted

## 2015-07-02 ENCOUNTER — Other Ambulatory Visit: Payer: Self-pay | Admitting: Women's Health

## 2015-07-02 DIAGNOSIS — N898 Other specified noninflammatory disorders of vagina: Secondary | ICD-10-CM

## 2015-07-02 LAB — URINE CULTURE
Colony Count: NO GROWTH
Organism ID, Bacteria: NO GROWTH

## 2015-07-02 MED ORDER — FLUCONAZOLE 150 MG PO TABS: 150.0000 mg | ORAL_TABLET | Freq: Once | ORAL | Status: DC

## 2015-07-02 MED ORDER — VALACYCLOVIR HCL 500 MG PO TABS: ORAL_TABLET | ORAL | Status: DC

## 2015-07-02 MED FILL — VALACYCLOVIR HCL 500 MG TAB: 500 | 15 days supply | Qty: 30 | Fill #0

## 2015-07-02 MED FILL — FLUCONAZOLE 150 MG TABLET: 150 | 1 days supply | Qty: 1 | Fill #0

## 2015-07-02 NOTE — Telephone Encounter (Signed)
Pt was seen on OV 05/29/16 prescribed Diflucan 150 mg  # 1 took Rx, felt better until today. Pt states she has lots of vaginal irritation with burning, pt said she feel worse now the before. Has not taking the septra DS #6 tablets yet, waiting on urine culture. Pt would like recommendations. Please advise

## 2015-07-02 NOTE — Telephone Encounter (Signed)
Telephone call, states continues to have a burning pain, states feels something is wrong, no visible lesions, had oral sex, no lesions were visible at office visit, partner HSV-1, will treat with Valtrex 500 twice daily for 7 days. Rx called in. Instructed to call if pain persists.

## 2015-07-03 ENCOUNTER — Encounter: Payer: Self-pay | Admitting: Women's Health

## 2015-07-03 ENCOUNTER — Telehealth: Payer: Self-pay | Admitting: *Deleted

## 2015-07-03 DIAGNOSIS — F419 Anxiety disorder, unspecified: Secondary | ICD-10-CM | POA: Diagnosis not present

## 2015-07-03 NOTE — Telephone Encounter (Signed)
Pt called requesting STD screening, transferred to front desk to schedule with nancy next week.

## 2015-07-04 ENCOUNTER — Telehealth: Payer: Self-pay | Admitting: *Deleted

## 2015-07-04 ENCOUNTER — Encounter: Payer: Self-pay | Admitting: Gynecology

## 2015-07-04 ENCOUNTER — Ambulatory Visit (INDEPENDENT_AMBULATORY_CARE_PROVIDER_SITE_OTHER): Payer: 59 | Admitting: Gynecology

## 2015-07-04 VITALS — BP 124/80 | Ht 66.0 in | Wt 125.0 lb

## 2015-07-04 DIAGNOSIS — B9689 Other specified bacterial agents as the cause of diseases classified elsewhere: Secondary | ICD-10-CM

## 2015-07-04 DIAGNOSIS — N760 Acute vaginitis: Secondary | ICD-10-CM | POA: Diagnosis not present

## 2015-07-04 DIAGNOSIS — Z113 Encounter for screening for infections with a predominantly sexual mode of transmission: Secondary | ICD-10-CM | POA: Diagnosis not present

## 2015-07-04 DIAGNOSIS — R300 Dysuria: Secondary | ICD-10-CM

## 2015-07-04 DIAGNOSIS — A499 Bacterial infection, unspecified: Secondary | ICD-10-CM | POA: Diagnosis not present

## 2015-07-04 LAB — URINALYSIS W MICROSCOPIC + REFLEX CULTURE
Bilirubin Urine: NEGATIVE
Casts: NONE SEEN [LPF]
Crystals: NONE SEEN [HPF]
Glucose, UA: NEGATIVE
Hgb urine dipstick: NEGATIVE
Ketones, ur: NEGATIVE
Leukocytes, UA: NEGATIVE
Nitrite: NEGATIVE
Protein, ur: NEGATIVE
RBC / HPF: NONE SEEN RBC/HPF (ref <=2)
Specific Gravity, Urine: 1.015 (ref 1.001–1.035)
WBC, UA: NONE SEEN WBC/HPF (ref <=5)
Yeast: NONE SEEN [HPF]
pH: 7 (ref 5.0–8.0)

## 2015-07-04 LAB — WET PREP FOR TRICH, YEAST, CLUE
Trich, Wet Prep: NONE SEEN
Yeast Wet Prep HPF POC: NONE SEEN

## 2015-07-04 MED ORDER — CLINDAMYCIN PHOSPHATE 2 % VA CREA: TOPICAL_CREAM | VAGINAL | Status: DC

## 2015-07-04 MED FILL — CLINDAMYCIN 2% VAGINAL CRM: 2 | 7 days supply | Qty: 40 | Fill #0

## 2015-07-04 NOTE — Telephone Encounter (Signed)
(  You are back up MD) pt called c/o clitoris discomfort started on Valtrex 500 mg twice daily on 07/02/15, pt is taking Motrin and no relief, has appointment scheduled with nancy on 07/10/15 doesn't see any lesions, bumps, no drainage, none thing abnormal to the eye. Pt asked if you have any other recommendation? She is very worried, asked if she could be worked in today? Please advise

## 2015-07-04 NOTE — Addendum Note (Signed)
Addended by: Terrance Mass on: 07/04/2015 04:32 PM   Modules accepted: Orders

## 2015-07-04 NOTE — Telephone Encounter (Signed)
Be here at 3;30

## 2015-07-04 NOTE — Patient Instructions (Signed)
Clindamycin vaginal cream What is this medicine? CLINDAMYCIN (KLIN da MYE sin) is a lincosamide antibiotic. It is used to treat vaginal infections caused by certain bacteria. This medicine may be used for other purposes; ask your health care provider or pharmacist if you have questions. What should I tell my health care provider before I take this medicine? They need to know if you have any of these conditions: -diarrhea -inflammatory bowel disease -kidney or liver disease -stomach problems like colitis -an unusual or allergic reaction to clindamycin, lincomycin, other medicines, foods, dyes or preservatives -pregnant or trying to get pregnant -breast-feeding How should I use this medicine? This medicine is only for use in the vagina. Place in the vagina using the special applicator supplied with the cream. Wash hands before and after use. Fill the applicator with cream. Lie on your back, part and bend your knees. Insert the applicator into the vagina and push the plunger to expel the cream into the vagina. Wash the applicator with warm soapy water and rinse well. Keep this medicine out of the eyes. If you do get any in your eyes rinse out with plenty of cool tap water. Take your medicine at regular intervals. Do not take your medicine more often than directed. Use this medicine for the full course prescribed by your doctor or health care professional, even if you think your condition is better. Do not stop using except on your the advice of your doctor or health care professional. Talk to your pediatrician regarding the use of this medicine in children. Special care may be needed. Overdosage: If you think you have taken too much of this medicine contact a poison control center or emergency room at once. NOTE: This medicine is only for you. Do not share this medicine with others. What if I miss a dose? If you miss a dose, use it as soon as you can. If it is almost time for your next dose, use only  that dose. Do not use double or extra doses. What may interact with this medicine? Interactions are not expected. Do not use any other vaginal products without telling your doctor or health care professional. This list may not describe all possible interactions. Give your health care provider a list of all the medicines, herbs, non-prescription drugs, or dietary supplements you use. Also tell them if you smoke, drink alcohol, or use illegal drugs. Some items may interact with your medicine. What should I watch for while using this medicine? Tell your doctor or health care professional if your symptoms do not start to get better in a few days. Do not use tampons or douches while using this medicine. Do not have sex until you have finished your treatment. Having sex can make the treatment less effective. After you finish treatment, do not use latex condoms for three days. There may still be some medicine in the vagina. This can damage the latex and make the condom less effective at preventing pregnancy. Your clothing may get soiled. To help prevent reinfection, wear freshly washed cotton, not synthetic, underwear. What side effects may I notice from receiving this medicine? Side effects that you should report to your doctor or health care professional as soon as possible: -allergic reactions like skin rash, itching or hives, swelling of the face, lips, or tongue -diarrhea that is watery or severe -fever or chills, sore throat -increased thirst -itching of the vaginal or genital area -pain during sexual intercourse -stomach pain or cramps -thick white vaginal discharge -unusual   bleeding or bruising Side effects that usually do not require medical attention (report to your doctor or health care professional if they continue or are bothersome): -nausea, vomiting This list may not describe all possible side effects. Call your doctor for medical advice about side effects. You may report side effects  to FDA at 1-800-FDA-1088. Where should I keep my medicine? Keep out of the reach of children. Store at room temperature between 20 and 25 degrees C (68 and 77 degrees F). Do not freeze. Throw away any unused medicine after the expiration date. NOTE: This sheet is a summary. It may not cover all possible information. If you have questions about this medicine, talk to your doctor, pharmacist, or health care provider.    2016, Elsevier/Gold Standard. (2013-01-18 16:18:30) Bacterial Vaginosis Bacterial vaginosis is a vaginal infection that occurs when the normal balance of bacteria in the vagina is disrupted. It results from an overgrowth of certain bacteria. This is the most common vaginal infection in women of childbearing age. Treatment is important to prevent complications, especially in pregnant women, as it can cause a premature delivery. CAUSES  Bacterial vaginosis is caused by an increase in harmful bacteria that are normally present in smaller amounts in the vagina. Several different kinds of bacteria can cause bacterial vaginosis. However, the reason that the condition develops is not fully understood. RISK FACTORS Certain activities or behaviors can put you at an increased risk of developing bacterial vaginosis, including:  Having a new sex partner or multiple sex partners.  Douching.  Using an intrauterine device (IUD) for contraception. Women do not get bacterial vaginosis from toilet seats, bedding, swimming pools, or contact with objects around them. SIGNS AND SYMPTOMS  Some women with bacterial vaginosis have no signs or symptoms. Common symptoms include:  Grey vaginal discharge.  A fishlike odor with discharge, especially after sexual intercourse.  Itching or burning of the vagina and vulva.  Burning or pain with urination. DIAGNOSIS  Your health care provider will take a medical history and examine the vagina for signs of bacterial vaginosis. A sample of vaginal fluid  may be taken. Your health care provider will look at this sample under a microscope to check for bacteria and abnormal cells. A vaginal pH test may also be done.  TREATMENT  Bacterial vaginosis may be treated with antibiotic medicines. These may be given in the form of a pill or a vaginal cream. A second round of antibiotics may be prescribed if the condition comes back after treatment. Because bacterial vaginosis increases your risk for sexually transmitted diseases, getting treated can help reduce your risk for chlamydia, gonorrhea, HIV, and herpes. HOME CARE INSTRUCTIONS   Only take over-the-counter or prescription medicines as directed by your health care provider.  If antibiotic medicine was prescribed, take it as directed. Make sure you finish it even if you start to feel better.  Tell all sexual partners that you have a vaginal infection. They should see their health care provider and be treated if they have problems, such as a mild rash or itching.  During treatment, it is important that you follow these instructions:  Avoid sexual activity or use condoms correctly.  Do not douche.  Avoid alcohol as directed by your health care provider.  Avoid breastfeeding as directed by your health care provider. SEEK MEDICAL CARE IF:   Your symptoms are not improving after 3 days of treatment.  You have increased discharge or pain.  You have a fever. MAKE SURE YOU:  Understand these instructions.  Will watch your condition.  Will get help right away if you are not doing well or get worse. FOR MORE INFORMATION  Centers for Disease Control and Prevention, Division of STD Prevention: [employer] American Sexual Health Association (ASHA): www.ashastd.org    This information is not intended to replace advice given to you by your health care provider. Make sure you discuss any questions you have with your health care provider.   Document Released: 06/14/2005 Document Revised:  07/05/2014 Document Reviewed: 01/24/2013 Elsevier Interactive Patient Education [employer].

## 2015-07-04 NOTE — Telephone Encounter (Signed)
Pt informed with the below note, appointment has it scheduled

## 2015-07-04 NOTE — Progress Notes (Signed)
   Patient is a 35 year old who presented to the office today complaining of periurethral irritation. She wanted to have an STD screen. She says she had a new sexual partner a week ago and only had oral sex no vaginal penetration. She has found out that her partner's bisexual and has had multiple sexual partners and for this reason she wanted to have a full STD screen. She had some slight irritation during urination but no true dysuria. She denied any back pain, fever, chills, nausea, or any vomiting. She denied any discharge per se. She had tried a Diflucan that she had at home and did not make much changes. She called the office and our nurse practitioner cauldron Valtrex 500 mg twice a day for 7 days in the event of a herpes outbreak. Patient states never saw any vesicles or any crusting.  Exam: Pelvic: Bartholin urethra Skene glands within normal limits Urethra slightly tender no lesions were noted. External genitalia labial minora labia minora not excoriated areas no vesicles no lesions seen no venereal warts. Perianal area unremarkable. Speculum was introduced into the vagina no lesions or discharge was noted cervix: No lesions or discharge Bimanual exam not done  Urinalysis was negative few bacteria.  Wet prep few clue cells rare WBC and too numerous to count bacteria  Assessment/plan: Patient with recent new partner who has had multiple sexual partners himself. Patient wanted an STD screens her an HIV, RPR, hepatitis B and C along with GC and Chlamydia culture was obtained. For her bacterial vaginosis she'll be prescribed Cleocin vaginal cream to apply daily at bedtime for one week. She should finish her 4 days remaining on her Valtrex. I've recommended in 6 months she have an HIV repeated.

## 2015-07-05 LAB — URINE CULTURE
Colony Count: NO GROWTH
Organism ID, Bacteria: NO GROWTH

## 2015-07-07 LAB — GC/CHLAMYDIA PROBE AMP
CT Probe RNA: NOT DETECTED
GC Probe RNA: NOT DETECTED

## 2015-07-08 ENCOUNTER — Encounter: Payer: Self-pay | Admitting: Women's Health

## 2015-07-08 ENCOUNTER — Encounter: Payer: Self-pay | Admitting: Gynecology

## 2015-07-08 DIAGNOSIS — B009 Herpesviral infection, unspecified: Secondary | ICD-10-CM | POA: Insufficient documentation

## 2015-07-08 LAB — HSV(HERPES SMPLX)ABS-I+II(IGG+IGM)-BLD
HSV 1 Glycoprotein G Ab, IgG: 11.66 IV — ABNORMAL HIGH
HSV 2 Glycoprotein G Ab, IgG: <0.1 IV
Herpes Simplex Vrs I&II-IgM Ab (EIA): 1.34 INDEX — ABNORMAL HIGH

## 2015-07-09 ENCOUNTER — Other Ambulatory Visit: Payer: Self-pay | Admitting: Women's Health

## 2015-07-09 DIAGNOSIS — N898 Other specified noninflammatory disorders of vagina: Secondary | ICD-10-CM

## 2015-07-09 MED ORDER — VALACYCLOVIR HCL 500 MG PO TABS: ORAL_TABLET | ORAL | Status: DC

## 2015-07-10 ENCOUNTER — Ambulatory Visit: Payer: 59 | Admitting: Women's Health

## 2015-07-11 ENCOUNTER — Other Ambulatory Visit: Payer: Self-pay | Admitting: Women's Health

## 2015-07-11 DIAGNOSIS — N898 Other specified noninflammatory disorders of vagina: Secondary | ICD-10-CM

## 2015-07-11 MED ORDER — VALACYCLOVIR HCL 500 MG PO TABS: ORAL_TABLET | ORAL | Status: DC

## 2015-07-11 MED FILL — VALACYCLOVIR HCL 500 MG TAB: 500 | 8 days supply | Qty: 30 | Fill #0

## 2015-07-14 DIAGNOSIS — F419 Anxiety disorder, unspecified: Secondary | ICD-10-CM | POA: Diagnosis not present

## 2015-07-16 ENCOUNTER — Encounter: Payer: Self-pay | Admitting: Women's Health

## 2015-07-18 ENCOUNTER — Telehealth: Payer: Self-pay | Admitting: *Deleted

## 2015-07-18 ENCOUNTER — Encounter: Payer: Self-pay | Admitting: Internal Medicine

## 2015-07-18 ENCOUNTER — Other Ambulatory Visit: Payer: Self-pay | Admitting: Women's Health

## 2015-07-18 DIAGNOSIS — R12 Heartburn: Secondary | ICD-10-CM

## 2015-07-18 MED ORDER — OMEPRAZOLE 40 MG PO CPDR: 40.0000 mg | DELAYED_RELEASE_CAPSULE | Freq: Every day | ORAL | Status: DC

## 2015-07-18 NOTE — Telephone Encounter (Signed)
Pt called stopped valtrex yesterday because vaginal symptoms are gone, pt said she has been e-mailing you via patient advice regarding the nausea. Her question is now has she developed a possible stomach ulcer? States last night she had a 8/10 pain in her abdomen, this am dull achy, no bloating, no blood in stool, states the stomach pain comes and goes, the nausea is continuously. Pt would like you recommendations, she said you may call her [phone]

## 2015-07-18 NOTE — Telephone Encounter (Signed)
Telephone call, history of a stomach ulcer in the past. Instructed to call [facility] for follow-up will schedule. If difficulty getting an appointment will call back. In the meanwhile will start on Prilosec 40 mg by mouth daily 1 month supply called in.

## 2015-07-21 MED FILL — VALACYCLOVIR HCL 500 MG TAB: 500 | 8 days supply | Qty: 30 | Fill #1

## 2015-07-31 MED FILL — DULoxetine HCL 20 MG CPEP: 20 | 90 days supply | Qty: 90 | Fill #0

## 2015-08-04 DIAGNOSIS — F419 Anxiety disorder, unspecified: Secondary | ICD-10-CM | POA: Diagnosis not present

## 2015-08-26 DIAGNOSIS — F419 Anxiety disorder, unspecified: Secondary | ICD-10-CM | POA: Diagnosis not present

## 2015-09-11 ENCOUNTER — Ambulatory Visit: Payer: 59 | Admitting: Internal Medicine

## 2015-11-27 ENCOUNTER — Encounter: Payer: Self-pay | Admitting: Gynecology

## 2015-11-27 ENCOUNTER — Telehealth: Payer: Self-pay | Admitting: *Deleted

## 2015-11-27 ENCOUNTER — Ambulatory Visit (INDEPENDENT_AMBULATORY_CARE_PROVIDER_SITE_OTHER): Payer: 59 | Admitting: Gynecology

## 2015-11-27 VITALS — BP 118/76

## 2015-11-27 DIAGNOSIS — N949 Unspecified condition associated with female genital organs and menstrual cycle: Secondary | ICD-10-CM | POA: Diagnosis not present

## 2015-11-27 DIAGNOSIS — R102 Pelvic and perineal pain: Secondary | ICD-10-CM

## 2015-11-27 DIAGNOSIS — B373 Candidiasis of vulva and vagina: Secondary | ICD-10-CM | POA: Diagnosis not present

## 2015-11-27 DIAGNOSIS — A499 Bacterial infection, unspecified: Secondary | ICD-10-CM | POA: Diagnosis not present

## 2015-11-27 DIAGNOSIS — B3731 Acute candidiasis of vulva and vagina: Secondary | ICD-10-CM

## 2015-11-27 DIAGNOSIS — N760 Acute vaginitis: Secondary | ICD-10-CM

## 2015-11-27 DIAGNOSIS — B9689 Other specified bacterial agents as the cause of diseases classified elsewhere: Secondary | ICD-10-CM

## 2015-11-27 LAB — URINALYSIS W MICROSCOPIC + REFLEX CULTURE
Bilirubin Urine: NEGATIVE
Casts: NONE SEEN [LPF]
Glucose, UA: NEGATIVE
Hgb urine dipstick: NEGATIVE
Ketones, ur: NEGATIVE
Leukocytes, UA: NEGATIVE
Nitrite: NEGATIVE
Protein, ur: NEGATIVE
RBC / HPF: NONE SEEN RBC/HPF (ref <=2)
Specific Gravity, Urine: 1.015 (ref 1.001–1.035)
Yeast: NONE SEEN [HPF]
pH: 7 (ref 5.0–8.0)

## 2015-11-27 LAB — WET PREP FOR TRICH, YEAST, CLUE
Clue Cells Wet Prep HPF POC: NONE SEEN
Trich, Wet Prep: NONE SEEN

## 2015-11-27 MED ORDER — FLUCONAZOLE 150 MG PO TABS: 150.0000 mg | ORAL_TABLET | Freq: Once | ORAL | Status: DC

## 2015-11-27 MED ORDER — TINIDAZOLE 500 MG PO TABS: ORAL_TABLET | ORAL | Status: DC

## 2015-11-27 MED FILL — TINIDAZOLE 500 MG TABLET: 500 | 4 days supply | Qty: 8 | Fill #0

## 2015-11-27 MED FILL — FLUCONAZOLE 150 MG TABLET: 150 | 1 days supply | Qty: 1 | Fill #0

## 2015-11-27 NOTE — Progress Notes (Signed)
   HPI: Patient is a 35 year old that presented to the office today complaining of vaginal pain and suprapubic discomfort. Patient was seen in January was diagnosed with HSV. She is not using any form of contraception and has not been sexually active for over 2 months. When she did have intercourse she did not describe any dyspareunia. She does state that time she feels relieved after voiding but feels a constant suprapubic pressure. She denied any fever, chills, nausea, vomiting. No back pain. No GI complaints. She had a negative GC and Chlamydia culture in January 2017   ROS: A ROS was performed and pertinent positives and negatives are included in the history.  GENERAL: No fevers or chills. HEENT: No change in vision, no earache, sore throat or sinus congestion. NECK: No pain or stiffness. CARDIOVASCULAR: No chest pain or pressure. No palpitations. PULMONARY: No shortness of breath, cough or wheeze. GASTROINTESTINAL: No abdominal pain, nausea, vomiting or diarrhea, melena or bright red blood per rectum. GENITOURINARY: No urinary frequency, urgency, hesitancy or dysuria. MUSCULOSKELETAL: No joint or muscle pain, no back pain, no recent trauma. DERMATOLOGIC: No rash, no itching, no lesions. ENDOCRINE: No polyuria, polydipsia, no heat or cold intolerance. No recent change in weight. HEMATOLOGICAL: No anemia or easy bruising or bleeding. NEUROLOGIC: No headache, seizures, numbness, tingling or weakness. PSYCHIATRIC: No depression, no loss of interest in normal activity or change in sleep pattern.   PE: Blood pressure 118/76 Gen. appearance well-developed well-nourished female with the above-mentioned complaining Back: No CVA tenderness Abdomen: Soft nontender no rebound or guarding Pelvic: Bartholin urethra Skene was within normal limits Vagina: No lesions or discharge no tenderness in the uterosacral region when probed with a large swab Cervix: No lesions or discharge Uterus: Anteverted anterior  nontender Adnexa: Right adnexa no masses or tenderness Left adnexa slight tenderness Rectal exam not done  Urinalysis only few bacteria culture pending  Wet prep many white blood cells many bacteria    Assessment Plan: #1 yeast infection will be treated with Diflucan 150 mg one by mouth #2 coinfection with Haemophilus vaginalis will treat bacterial vaginosis with Tindamax 500 mg 4 tablets today repeat in 24 hours #3 because of her chronic pelvic pain and tenderness in the left lower quadrant as well as suprapubic and ultrasound will be ordered at the time of her annual exam next year #4 provide her with literature information on interstitial cystitis as well as endometriosis. #5 and given her the option of starting her on a 20 g oral contraceptive pill to regulate her cycles and see if he would improve her pelvic pain but she would like to wait until after the ultrasound. Meanwhile she'll continue with her valacyclovir 500 mg daily for suppressive therapy of her HSV to see that improves her symptoms. If she does start on the 20 g oral contraceptive pill she'll monitor symptoms and if no improvement after 3 months she will definitely need needed to be referred to the urologist for evaluation of interstitial cystitis.    Greater than 50% of time was spent in counseling and coordinating care of this patient.   Time of consultation: 15   Minutes.

## 2015-11-27 NOTE — Patient Instructions (Signed)
Interstitial Cystitis Interstitial cystitis is a condition that causes inflammation of the bladder. The bladder is a hollow organ in the lower part of your abdomen. It stores urine after the urine is made by your kidneys. With interstitial cystitis, you may have pain in the bladder area. You may also have a frequent and urgent need to urinate. The severity of interstitial cystitis can vary from person to person. You may have flare-ups of the condition, and then it may go away for a while. For many people who have this condition, it becomes a long-term problem. CAUSES The cause of this condition is not known. RISK FACTORS This condition is more likely to develop in women. SYMPTOMS Symptoms of interstitial cystitis vary, and they can change over time. Symptoms may include:  Discomfort or pain in the bladder area. This can range from mild to severe. The pain may change in intensity as the bladder fills with urine or as it empties.  Pelvic pain.  An urgent need to urinate.  Frequent urination.  Pain during sexual intercourse.  Pinpoint bleeding on the bladder wall. For women, the symptoms often get worse during menstruation. DIAGNOSIS This condition is diagnosed by evaluating your symptoms and ruling out other causes. A physical exam will be done. Various tests may be done to rule out other conditions. Common tests include:  Urine tests.  Cystoscopy. In this test, a tool that is like a very thin telescope is used to look into your bladder.  Biopsy. This involves taking a sample of tissue from the bladder wall to be examined under a microscope. TREATMENT There is no cure for interstitial cystitis, but treatment methods are available to control your symptoms. Work closely with your health care provider to find the treatments that will be most effective for you. Treatment options may include:  Medicines to relieve pain and to help reduce the number of times that you feel the need to  urinate.  Bladder training. This involves learning ways to control when you urinate, such as:  Urinating at scheduled times.  Training yourself to delay urination.  Doing exercises (Kegel exercises) to strengthen the muscles that control urine flow.  Lifestyle changes, such as changing your diet or taking steps to control stress.  Use of a device that provides electrical stimulation in order to reduce pain.  A procedure that stretches your bladder by filling it with air or fluid.  Surgery. This is rare. It is only done for extreme cases if other treatments do not help. HOME CARE INSTRUCTIONS  Take medicines only as directed by your health care provider.  Use bladder training techniques as directed.  Keep a bladder diary to find out which foods, liquids, or activities make your symptoms worse.  Use your bladder diary to schedule bathroom trips. If you are away from home, plan to be near a bathroom at each of your scheduled times.  Make sure you urinate just before you leave the house and just before you go to bed.  Do Kegel exercises as directed by your health care provider.  Do not drink alcohol.  Do not use any tobacco products, including cigarettes, chewing tobacco, or electronic cigarettes. If you need help quitting, ask your health care provider.  Make dietary changes as directed by your health care provider. You may need to avoid spicy foods and foods that contain a high amount of potassium.  Limit your drinking of beverages that stimulate urination. These include soda, coffee, and tea.  Keep all follow-up   visits as directed by your health care provider. This is important. SEEK MEDICAL CARE IF:  Your symptoms do not get better after treatment.  Your pain and discomfort are getting worse.  You have more frequent urges to urinate.  You have a fever. SEEK IMMEDIATE MEDICAL CARE IF:  You are not able to control your bladder at all.   This information is not  intended to replace advice given to you by your health care provider. Make sure you discuss any questions you have with your health care provider.   Document Released: 02/13/2004 Document Revised: 07/05/2014 Document Reviewed: 02/19/2014 Elsevier Interactive Patient Education [employer]. Endometriosis Endometriosis is a condition in which the tissue that lines the uterus (endometrium) grows outside of its normal location. The tissue may grow in many locations close to the uterus, but it commonly grows on the ovaries, fallopian tubes, vagina, or bowel. Because the uterus expels, or sheds, its lining every menstrual cycle, there is bleeding wherever the endometrial tissue is located. This can cause pain because blood is irritating to tissues not normally exposed to it.  CAUSES  The cause of endometriosis is not known.  SIGNS AND SYMPTOMS  Often, there are no symptoms. When symptoms are present, they can vary with the location of the displaced tissue. Various symptoms can occur at different times. Although symptoms occur mainly during a woman's menstrual period, they can also occur midcycle and usually stop with menopause. Some people may go months with no symptoms at all. Symptoms may include:   Back or abdominal pain.   Heavier bleeding during periods.   Pain during intercourse.   Painful bowel movements.   Infertility. DIAGNOSIS  Your health care provider will do a physical exam and ask about your symptoms. Various tests may be done, such as:   Blood tests and urine tests. These are done to help rule out other problems.   Ultrasound. This test is done to look for abnormal tissue.   An X-ray of the lower bowel (barium enema).  Laparoscopy. In this procedure, a thin, lighted tube with a tiny camera on the end (laparoscope) is inserted into your abdomen. This helps your health care provider look for abnormal tissue to confirm the diagnosis. The health care provider may also  remove a small piece of tissue (biopsy) from any abnormal tissue found. This tissue sample can then be sent to a lab so it can be looked at under a microscope. TREATMENT  Treatment will vary and may include:   Medicines to relieve pain. Nonsteroidal anti-inflammatory drugs (NSAIDs) are a type of pain medicine that can help to relieve the pain caused by endometriosis.  Hormonal therapy. When using hormonal therapy, periods are eliminated. This eliminates the monthly exposure to blood by the displaced endometrial tissue.   Surgery. Surgery may sometimes be done to remove the abnormal endometrial tissue. In severe cases, surgery may be done to remove the fallopian tubes, uterus, and ovaries (hysterectomy). HOME CARE INSTRUCTIONS   Take all medicines as directed by your health care provider. Do not take aspirin because it may increase bleeding when you are not on hormonal therapy.   Avoid activities that produce pain, including sexual activity. SEEK MEDICAL CARE IF:  You have pelvic pain before, after, or during your periods.  You have pelvic pain between periods that gets worse during your period.  You have pelvic pain during or after sex.  You have pelvic pain with bowel movements or urination, especially during your period.  You have problems getting pregnant.  You have a fever. SEEK IMMEDIATE MEDICAL CARE IF:   Your pain is severe and is not responding to pain medicine.   You have severe nausea and vomiting, or you cannot keep foods down.   You have pain that is limited to the right lower part of your abdomen.   You have swelling or increasing pain in your abdomen.   You see blood in your stool.  MAKE SURE YOU:   Understand these instructions.  Will watch your condition.  Will get help right away if you are not doing well or get worse.   This information is not intended to replace advice given to you by your health care provider. Make sure you discuss any  questions you have with your health care provider.   Document Released: 06/11/2000 Document Revised: 07/05/2014 Document Reviewed: 02/09/2013 Elsevier Interactive Patient Education [employer].

## 2015-11-27 NOTE — Telephone Encounter (Signed)
Pt called c/o lower abdominal discomfort that comes and goes, notices more so when having HSV outbreak.  Pt said pain can be sharp at times, pain level 6/7 out of 10, no lesions from outbreak, LMP:10/28/15, normal cycle, pt will continue taking OTC for discomfort as needed will schedule OV for tomorrow. Transferred to front desk.

## 2015-11-27 NOTE — Telephone Encounter (Signed)
No not now.

## 2015-11-27 NOTE — Telephone Encounter (Signed)
Pt was seen today she asked if you thought she would need a Rx for prednisone for possibly interstitial cystitis if that would help with inflammation? Please advise

## 2015-11-28 NOTE — Telephone Encounter (Signed)
Pt informed with the below note. 

## 2015-11-29 LAB — URINE CULTURE: Colony Count: 75000

## 2015-12-05 ENCOUNTER — Ambulatory Visit: Payer: 59 | Admitting: Women's Health

## 2015-12-06 ENCOUNTER — Telehealth: Payer: Self-pay | Admitting: Obstetrics and Gynecology

## 2015-12-06 NOTE — Telephone Encounter (Signed)
The patient called c/o severe abdominal pain, gas and diarrhea after taking the tindamax last night. Her pain is predominantly in her upper abdomen. Secondary to the severity of her pain she will go to the ER (she has a ride).

## 2015-12-09 ENCOUNTER — Other Ambulatory Visit: Payer: Self-pay | Admitting: Women's Health

## 2015-12-09 ENCOUNTER — Telehealth: Payer: Self-pay | Admitting: *Deleted

## 2015-12-09 DIAGNOSIS — R309 Painful micturition, unspecified: Secondary | ICD-10-CM

## 2015-12-09 MED ORDER — PHENAZOPYRIDINE HCL 100 MG PO TABS: 100.0000 mg | ORAL_TABLET | Freq: Three times a day (TID) | ORAL | Status: DC | PRN

## 2015-12-09 NOTE — Telephone Encounter (Signed)
Pt called to follow up with you saw JF on 11/27/15 was treated with medication, has had a couple of reactions from medications in past such valtrex. Now has funny feeling on her tongue. Pt asked if you could call her at [phone]

## 2015-12-09 NOTE — Telephone Encounter (Signed)
TC urinary burning, had to leave work, will try pyridium 100mg  TID, rx called.

## 2015-12-10 ENCOUNTER — Other Ambulatory Visit: Payer: Self-pay | Admitting: Women's Health

## 2015-12-10 DIAGNOSIS — R102 Pelvic and perineal pain: Secondary | ICD-10-CM

## 2015-12-12 ENCOUNTER — Ambulatory Visit (INDEPENDENT_AMBULATORY_CARE_PROVIDER_SITE_OTHER): Payer: 59

## 2015-12-12 ENCOUNTER — Encounter: Payer: Self-pay | Admitting: Women's Health

## 2015-12-12 ENCOUNTER — Ambulatory Visit: Payer: 59 | Admitting: Women's Health

## 2015-12-12 ENCOUNTER — Ambulatory Visit (INDEPENDENT_AMBULATORY_CARE_PROVIDER_SITE_OTHER): Payer: 59 | Admitting: Women's Health

## 2015-12-12 ENCOUNTER — Other Ambulatory Visit: Payer: Self-pay | Admitting: Women's Health

## 2015-12-12 ENCOUNTER — Other Ambulatory Visit: Payer: 59

## 2015-12-12 VITALS — BP 110/80 | Ht 66.0 in | Wt 125.0 lb

## 2015-12-12 DIAGNOSIS — R102 Pelvic and perineal pain: Secondary | ICD-10-CM

## 2015-12-12 DIAGNOSIS — R188 Other ascites: Secondary | ICD-10-CM

## 2015-12-12 DIAGNOSIS — Z113 Encounter for screening for infections with a predominantly sexual mode of transmission: Secondary | ICD-10-CM | POA: Diagnosis not present

## 2015-12-12 DIAGNOSIS — N83201 Unspecified ovarian cyst, right side: Secondary | ICD-10-CM | POA: Diagnosis not present

## 2015-12-12 NOTE — Progress Notes (Signed)
Patient ID: [name], female   DOB: Feb 15, 1981, 35 y.o.   MRN: [id number] Presents for ultrasound. Has had increased low abdominal pressure/pain in the suprapubic region for the past few weeks. Urinary meatus burning, Questionable IC. Diagnosed with HSV 06/2015 currently on acyclovir but has had several outbreaks that cause increased urinary burning sensation. States tired/emotional of having outbreaks,  abdominal pain and HSV. Not sexually active. Monthly cycle.  Exam: Tearful, ultrasound: T/V anteverted uterus homogeneous. Right ovary follicle with hypo-echogenic cystic mass internal low level echoes negative CFD's 14 x 14 x 15 mm. Right ovary surrounded by free fluid 48 x 19 mm. Left ovary normal. Fluid in cul-de-sac 36 x 15 x 34 mm.  Ruptured ovarian cyst Urinary burning/pressure questionable IC HSV  Plan: Reviewed ruptured cyst may have caused abdominal pain/pelvic pressure. Will watch at this time, Motrin as needed for discomfort. Continue acyclovir daily increase dose with outbreaks and/or before cycles. Contraception reviewed declines will use condoms if sexually active.

## 2015-12-13 LAB — HEPATITIS B SURFACE ANTIGEN: Hepatitis B Surface Ag: NEGATIVE

## 2015-12-13 LAB — HEPATITIS C ANTIBODY: HCV Ab: NEGATIVE

## 2015-12-13 LAB — RPR

## 2015-12-15 ENCOUNTER — Encounter: Payer: Self-pay | Admitting: Women's Health

## 2015-12-15 LAB — HIV ANTIBODY (ROUTINE TESTING W REFLEX): HIV 1&2 Ab, 4th Generation: NONREACTIVE

## 2015-12-16 DIAGNOSIS — E559 Vitamin D deficiency, unspecified: Secondary | ICD-10-CM | POA: Diagnosis not present

## 2015-12-16 DIAGNOSIS — R197 Diarrhea, unspecified: Secondary | ICD-10-CM | POA: Diagnosis not present

## 2015-12-16 DIAGNOSIS — N83201 Unspecified ovarian cyst, right side: Secondary | ICD-10-CM | POA: Diagnosis not present

## 2015-12-16 DIAGNOSIS — R5383 Other fatigue: Secondary | ICD-10-CM | POA: Diagnosis not present

## 2015-12-16 LAB — VITAMIN B12: Vitamin B-12: 581

## 2015-12-22 DIAGNOSIS — F419 Anxiety disorder, unspecified: Secondary | ICD-10-CM | POA: Diagnosis not present

## 2016-01-28 ENCOUNTER — Telehealth: Payer: Self-pay | Admitting: *Deleted

## 2016-01-28 NOTE — Telephone Encounter (Signed)
Pt Called feels like Valtrex isnt keeping HSV controlled.  She was diagnosed in Jan 2017 and was taking Valtrex twice daily with outbreaks and Valtrex daily for suppression but feels for past month hasnt gotten any better. I asked the patient if she has a lot of sores and she said she has never had any but has burning pain and doesn't generally feel well when she feels the virus is active.  The pain radiates up into lower pelvis and Tylenol hasnt helped pain today.  Wants to discuss what to do. She has been checked for other STDs also within the past several months with all negative.   Please advise. KW CMA

## 2016-01-28 NOTE — Telephone Encounter (Signed)
TC  Went to [facility], all normal labs.  States not sure what else to do. States feels all the symptoms stemmed from initial HSV in Jan, 2017 feels like the virus has not gone into remission.   Headaches, nausea, general poor feeling, pain that radiates from perineum to pelvis, bladder symptoms of pain, urethra being pinched  and pressure. Denies depression symptoms, tick bites, foreign travel. Will schedule her appt with infectious disease, can go anytime.    Please schedule appointment with infectious diseases at [facility].

## 2016-01-29 NOTE — Telephone Encounter (Signed)
I called Infectious disease and left a message to see how I proceed with scheduling appointment,.

## 2016-01-29 NOTE — Addendum Note (Signed)
Addended by: Thamas Jaegers on: 01/29/2016 10:21 AM   Modules accepted: Orders

## 2016-01-30 DIAGNOSIS — R5383 Other fatigue: Secondary | ICD-10-CM | POA: Diagnosis not present

## 2016-01-30 DIAGNOSIS — B009 Herpesviral infection, unspecified: Secondary | ICD-10-CM | POA: Diagnosis not present

## 2016-01-30 DIAGNOSIS — R51 Headache: Secondary | ICD-10-CM | POA: Diagnosis not present

## 2016-01-30 NOTE — Addendum Note (Signed)
Addended by: Thamas Jaegers on: 01/30/2016 02:25 PM   Modules accepted: Orders

## 2016-01-30 NOTE — Telephone Encounter (Signed)
I found referral form will fax notes and wait for response for ID.

## 2016-02-03 ENCOUNTER — Encounter: Payer: Self-pay | Admitting: Women's Health

## 2016-02-04 MED FILL — DOXYCYCLINE HYCLATE 100 MG: 100 | 21 days supply | Qty: 42 | Fill #0

## 2016-02-09 DIAGNOSIS — F419 Anxiety disorder, unspecified: Secondary | ICD-10-CM | POA: Diagnosis not present

## 2016-02-09 NOTE — Telephone Encounter (Signed)
Appointment 03/09/16 with Dr.Campbell

## 2016-02-10 ENCOUNTER — Ambulatory Visit (INDEPENDENT_AMBULATORY_CARE_PROVIDER_SITE_OTHER): Payer: 59 | Admitting: Internal Medicine

## 2016-02-10 DIAGNOSIS — R5382 Chronic fatigue, unspecified: Secondary | ICD-10-CM | POA: Diagnosis not present

## 2016-02-10 DIAGNOSIS — G9332 Myalgic encephalomyelitis/chronic fatigue syndrome: Secondary | ICD-10-CM

## 2016-02-10 NOTE — Progress Notes (Signed)
TC states is very frustrated, will continue follow up with primary care.

## 2016-02-10 NOTE — Progress Notes (Signed)
[place] for Infectious Disease  Reason for Consult: Possible HSV-1 infection and [facility] spotted fever Referring Provider: Elon Alas, NP and Minette Brine, FNP-BC  Patient Active Problem List   Diagnosis Date Noted  . HSV-1 (herpes simplex virus 1) infection 07/08/2015  . Yeast vaginitis 06/30/2015  . CIN II (cervical intraepithelial neoplasia II) 08/17/2011  . Migraines 08/05/2011  . Anxiety and depression 08/05/2011    Patient's Medications  New Prescriptions   No medications on file  Previous Medications   No medications on file  Modified Medications   No medications on file  Discontinued Medications   DOXYCYCLINE (VIBRA-TABS) 100 MG TABLET    Take 100 mg by mouth 2 (two) times daily.   VALACYCLOVIR (VALTREX) 500 MG TABLET    Take 2 twice daily  prn    Recommendations: 1. I have given her written information about Chronic Fatigue Syndrome/Systemic Exercise Intolerance Disease 2. Discontinue valacyclovir and doxycycline   Assessment: I do not believe that her periurethral, vaginal and pelvic pain are due to herpes simplex infection. Her serology suggests that she has been infected with HSV-1 in the past but her chronic symptoms unresponsive to valacyclovir are not compatible with HSV-1 outbreaks. I do not believe typical vesicles have ever been seen on clinical exam. I will have her stop valacyclovir now.  I also do not believe that she has [facility] spotted fever. The IgG antibody is relatively nonspecific and is usually not detectable during the acute illness. Her symptoms are quite chronic and not indicative of [facility] spotted fever. I will have her stop doxycycline now.  Many of her symptoms, especially her debilitating fatigue are compatible with chronic fatigue syndrome which is now known as systemic exercise intolerance disease. I have given her written information about this complex disorder. I told her that there is no known cause  and currently no cure but that in most people it will eventually burn itself out. I'm concerned that she may have underlying, active depression contributing to her symptoms. I talked to her about the possibility that she could benefit from cognitive behavioral therapy. I asked her to call me within the next week to review any questions she might have.   HPI: Kristie [name] is a 35 y.o. female who developed periurethral discomfort last December shortly after having oral sex by a new female partner. She learned that he was bisexual and became concerned that she might have acquired a sexually transmitted disease. She saw her gynecologist who ordered testing for HIV, syphilis, gonorrhea, chlamydia and herpes. All tests were negative except for herpes simplex antibodies. She was positive for IgM and IgG. She was started on valacyclovir twice daily and has been on it since January. She has not noted any improvement in the pain. Her pain is coming more frequently and she states that she has pain on more days than not. Sometimes the pain is periurethral. Other times it is more generalized vaginal pain and she also has some intermittent, deep "pelvic" pain.  She's also suffering from debilitating fatigue. That actually started several years ago and has been getting worse. She notes that she has had to give up running, Pilates and yoga because exertion makes her fatigue and other symptoms worse. She is also bothered by chronic headaches, neck pain, back pain, dizziness, anorexia, chest pain, dyspnea on exertion and nausea. She describes her fatigue as feeling like her "body is full of lead" and it  feels like "I am dragging through water".  More recently she is describes some subjective fevers. She has never had documented fever. Because of the subjective fevers, headache and fatigue she had serologic testing for possible [facility] spotted fever. IgM antibody was negative but IgG antibody was positive. She started on  doxycycline last week. She states that she feels worse since starting doxycycline. She has not had any rash. She has not had any recent, known tick exposure.  She states that she has been on an emotional roller coaster. She has a history of depression but denies feeling depressed now. However she became very emotional and tearful when talking about the fact that she has been so tired she has not been able to be around her friends recently.  Review of Systems: Review of Systems  Constitutional: Positive for malaise/fatigue. Negative for chills, diaphoresis, fever and weight loss.  HENT: Negative for sore throat.   Respiratory: Positive for shortness of breath. Negative for cough and sputum production.   Cardiovascular: Positive for chest pain.  Gastrointestinal: Positive for constipation and nausea. Negative for abdominal pain, diarrhea, heartburn and vomiting.  Genitourinary: Negative for dysuria.  Musculoskeletal: Positive for back pain, joint pain, myalgias and neck pain.  Skin: Negative for rash.  Neurological: Positive for dizziness and headaches. Negative for focal weakness.      Past Medical History:  Diagnosis Date  . Asthma, exercise induced   . HPV in female 07/2010   high risk  . Migraines 8-12   SEES DR. FREEMAN    Social History  Substance Use Topics  . Smoking status: Former Research scientist (life sciences)  . Smokeless tobacco: Former Systems developer    Quit date: 09/17/2003  . Alcohol use Yes     Comment: once  aweek to once every other week    Family History  Problem Relation Age of Onset  . Hypertension Mother   . Cancer Maternal Aunt     breast two times  . Breast cancer Maternal Aunt   . Prostate cancer Maternal Uncle   . Hypertension Maternal Grandmother   . Heart disease Maternal Grandmother   . Lung cancer Maternal Grandfather    Allergies  Allergen Reactions  . Acyclovir And Related     Abdominal pain,nausea, dizziness  . Sertraline Hcl     SEVERE PARANOID  . Tindamax  [Tinidazole]     OBJECTIVE: Vitals:   02/10/16 0857  BP: 112/69  Pulse: (!) 56  Weight: 122 lb (55.3 kg)   Body mass index is 19.69 kg/m.   Physical Exam  Constitutional: She is oriented to person, place, and time.  She is well dressed, healthy-appearing young woman.  HENT:  Mouth/Throat: No oropharyngeal exudate.  Eyes: Conjunctivae are normal.  Cardiovascular: Normal rate and regular rhythm.   No murmur heard. Pulmonary/Chest: Effort normal and breath sounds normal. She has no wheezes. She has no rales.  Abdominal: Soft. She exhibits no mass. There is no tenderness.  Musculoskeletal: Normal range of motion. She exhibits no edema or tenderness.  Lymphadenopathy:    She has no cervical adenopathy.    She has no axillary adenopathy.  Neurological: She is alert and oriented to person, place, and time.  Skin: No rash noted.  Psychiatric: Affect normal.  She was tearful during parts of the exam.    Microbiology: No results found for this or any previous visit (from the past 240 hour(s)).  Michel Bickers, MD [facility] for Infectious [place] [facility] [phone] pager  [phone] cell 02/10/2016, 10:11 AM

## 2016-02-11 ENCOUNTER — Encounter: Payer: Self-pay | Admitting: Women's Health

## 2016-02-16 ENCOUNTER — Telehealth: Payer: Self-pay | Admitting: *Deleted

## 2016-02-16 DIAGNOSIS — R5382 Chronic fatigue, unspecified: Secondary | ICD-10-CM

## 2016-02-16 NOTE — Telephone Encounter (Signed)
Chest x-ray order placed, patient informed.

## 2016-02-16 NOTE — Addendum Note (Signed)
Addended by: Lorne Skeens D on: 02/16/2016 05:13 PM   Modules accepted: Orders

## 2016-02-16 NOTE — Telephone Encounter (Signed)
Patient called to share thoughts about her symptoms after reading materials given to her by Dr.Campbell.  She does not believe that she has the symptoms of chronic fatigue syndrome/systemic exercise intolerance disease.  She stated that she does not have joint pain.  She does endorse shortness of breath/chest pain with exercise.  She is wondering whether there could be a hormonal component.  She stated that she stopped taking Cymbalta 4 months ago.  She read that there could be a relationship between stopping Cymbalta and the adrenal glands. She started crying during the conversation and short of breath.  She was able to calm down and the shortness of breath went away. She said she sent a MyChart message to her gynecologist today.  She would also like to speak with Dr. Megan Salon about her symptoms and diagnosis.

## 2016-02-16 NOTE — Telephone Encounter (Signed)
I spoke to Kristie [name] by phone this afternoon. She states that she read the information I gave her about chronic fatigue syndrome/systemic exercise intolerance disease and is not convinced that that is what she has. She states she is very concerned because she's had periods where she has had severe chest pain and shortness of breath. She has also had occasional episodes of anxiety which is new for her. She has been worried about this unknown if that she has considered going to the emergency department. She tells me that she stopped the Cymbalta in April because she did not feel like she needed it but that she has been feeling worse since that time. She has been more dizzy and more fatigued. She's also been having more hair loss than usual.  She had no findings on exam to suggest cardiopulmonary problems but she is very worried about her chest pain. I told her we can get a two-view chest x-ray to make sure there is no obvious problem. I told her that I still believe that she has some variant of chronic fatigue syndrome but I'm also worried about underlying depression and anxiety that may be worse now that she is off Cymbalta. I suggested that she talk with her gynecologist who had been prescribing Cymbalta and consider restarting it at least for 1 month to see if she feels better. I do not think she has any hormonal problem causing her symptoms.

## 2016-02-17 ENCOUNTER — Other Ambulatory Visit: Payer: Self-pay | Admitting: Women's Health

## 2016-02-17 ENCOUNTER — Ambulatory Visit (HOSPITAL_COMMUNITY)
Admission: RE | Admit: 2016-02-17 | Discharge: 2016-02-17 | Disposition: A | Discharge status: Home / Self Care | Payer: 59 | Source: Ambulatory Visit | Attending: Internal Medicine | Admitting: Internal Medicine

## 2016-02-17 ENCOUNTER — Other Ambulatory Visit: Payer: Self-pay | Admitting: *Deleted

## 2016-02-17 DIAGNOSIS — R079 Chest pain, unspecified: Secondary | ICD-10-CM | POA: Diagnosis not present

## 2016-02-17 DIAGNOSIS — F4322 Adjustment disorder with anxiety: Secondary | ICD-10-CM

## 2016-02-17 DIAGNOSIS — R0602 Shortness of breath: Secondary | ICD-10-CM | POA: Diagnosis not present

## 2016-02-17 DIAGNOSIS — R5382 Chronic fatigue, unspecified: Secondary | ICD-10-CM | POA: Diagnosis not present

## 2016-02-17 MED ORDER — ALPRAZOLAM 0.25 MG PO TABS: 0.2500 mg | ORAL_TABLET | Freq: Every evening | ORAL | 1 refills | Status: DC | PRN

## 2016-02-17 MED ORDER — ESCITALOPRAM OXALATE 10 MG PO TABS: 10.0000 mg | ORAL_TABLET | Freq: Every day | ORAL | 2 refills | Status: DC

## 2016-02-17 MED FILL — ALPRAZolam 0.25 MG TABS: 0.25 | 30 days supply | Qty: 30 | Fill #0

## 2016-03-04 ENCOUNTER — Telehealth: Payer: Self-pay

## 2016-03-04 DIAGNOSIS — H8110 Benign paroxysmal vertigo, unspecified ear: Secondary | ICD-10-CM

## 2016-03-04 DIAGNOSIS — Z8669 Personal history of other diseases of the nervous system and sense organs: Secondary | ICD-10-CM

## 2016-03-04 NOTE — Telephone Encounter (Signed)
TC, continues with Headaches, mostly frontal and temporal, usually only 5 days per moths with no headaches, uses OTC tylenol, or massage.  Hx of migraines. Also having vertigo, rt hand shakiness. Bladder pain with out infection or incontinence.  Questions if she has something else going on such as MS.  please schedule her for MRI of the brain. 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

## 2016-03-04 NOTE — Telephone Encounter (Signed)
Patient called and asked if [place] could call her. Would like to update her on her condition and discuss a few things going on with her health.

## 2016-03-06 ENCOUNTER — Encounter: Payer: Self-pay | Admitting: Women's Health

## 2016-03-08 ENCOUNTER — Encounter: Payer: Self-pay | Admitting: *Deleted

## 2016-03-08 NOTE — Addendum Note (Signed)
Addended by: Thamas Jaegers on: 03/08/2016 10:15 AM   Modules accepted: Orders

## 2016-03-08 NOTE — Telephone Encounter (Signed)
Appointment on 03/12/16 @ 4:30pm at [facility] for MRI of brain w/o contrast per [facility] radiology tech at [facility]. I will sent pt a e-mail via epic to relay information as it is easier to contact pt via e-mail

## 2016-03-09 ENCOUNTER — Ambulatory Visit: Payer: 59 | Admitting: Internal Medicine

## 2016-03-10 NOTE — Telephone Encounter (Signed)
Pt received the patient e-mail

## 2016-03-12 ENCOUNTER — Ambulatory Visit (HOSPITAL_COMMUNITY)
Admission: RE | Admit: 2016-03-12 | Discharge: 2016-03-12 | Disposition: A | Discharge status: Home / Self Care | Payer: 59 | Source: Ambulatory Visit | Attending: Women's Health | Admitting: Women's Health

## 2016-03-12 DIAGNOSIS — H8110 Benign paroxysmal vertigo, unspecified ear: Secondary | ICD-10-CM | POA: Diagnosis not present

## 2016-03-12 DIAGNOSIS — R42 Dizziness and giddiness: Secondary | ICD-10-CM | POA: Diagnosis not present

## 2016-03-12 DIAGNOSIS — Z8669 Personal history of other diseases of the nervous system and sense organs: Secondary | ICD-10-CM | POA: Insufficient documentation

## 2016-03-14 ENCOUNTER — Encounter: Payer: Self-pay | Admitting: Women's Health

## 2016-04-12 LAB — HEMOGLOBIN A1C: Hemoglobin A1C: 4.9

## 2016-04-12 LAB — TSH: TSH: 1.01 (ref <=5.90)

## 2016-04-26 LAB — CBC AND DIFFERENTIAL: Neutrophils Absolute: 2

## 2016-05-11 ENCOUNTER — Ambulatory Visit (INDEPENDENT_AMBULATORY_CARE_PROVIDER_SITE_OTHER): Payer: 59 | Admitting: Women's Health

## 2016-05-11 ENCOUNTER — Encounter: Payer: Self-pay | Admitting: Women's Health

## 2016-05-11 VITALS — BP 112/79 | Ht 67.0 in | Wt 123.0 lb

## 2016-05-11 DIAGNOSIS — Z1151 Encounter for screening for human papillomavirus (HPV): Secondary | ICD-10-CM

## 2016-05-11 DIAGNOSIS — R102 Pelvic and perineal pain: Secondary | ICD-10-CM

## 2016-05-11 DIAGNOSIS — Z01419 Encounter for gynecological examination (general) (routine) without abnormal findings: Secondary | ICD-10-CM | POA: Diagnosis not present

## 2016-05-11 DIAGNOSIS — R5383 Other fatigue: Secondary | ICD-10-CM

## 2016-05-11 DIAGNOSIS — N898 Other specified noninflammatory disorders of vagina: Secondary | ICD-10-CM

## 2016-05-11 LAB — WET PREP FOR TRICH, YEAST, CLUE
Clue Cells Wet Prep HPF POC: NONE SEEN
Trich, Wet Prep: NONE SEEN
Yeast Wet Prep HPF POC: NONE SEEN
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
## 2016-05-11 NOTE — Patient Instructions (Signed)

## 2016-05-12 LAB — URINALYSIS W MICROSCOPIC + REFLEX CULTURE
Bacteria, UA: NONE SEEN [HPF]
Bilirubin Urine: NEGATIVE
Casts: NONE SEEN [LPF]
Crystals: NONE SEEN [HPF]
Glucose, UA: NEGATIVE
Hgb urine dipstick: NEGATIVE
Ketones, ur: NEGATIVE
Leukocytes, UA: NEGATIVE
Nitrite: NEGATIVE
Protein, ur: NEGATIVE
RBC / HPF: NONE SEEN RBC/HPF (ref <=2)
Specific Gravity, Urine: 1.005 (ref 1.001–1.035)
Squamous Epithelial / LPF: NONE SEEN [HPF] (ref <=5)
WBC, UA: NONE SEEN WBC/HPF (ref <=5)
Yeast: NONE SEEN [HPF]
pH: 7.5 (ref 5.0–8.0)

## 2016-05-13 LAB — ANA: Anti Nuclear Antibody(ANA): NEGATIVE

## 2016-05-13 MED FILL — valACYclovir HCL 1 GM TABS: 1 | 30 days supply | Qty: 30 | Fill #0

## 2016-05-14 LAB — RFX DRVVT SCR W/RFLX CONF 1:1 MIX: dRVVT Screen: 34 s (ref <=45)

## 2016-05-14 LAB — RFX PTT-LA W/RFX TO HEX PHASE CONF: PTT-LA Screen: 34 s (ref <=40)

## 2016-05-14 LAB — HPV TYPE 16 AND 18/45 RNA
HPV Type 16 RNA: NOT DETECTED
HPV Type 18/45 RNA: NOT DETECTED

## 2016-05-14 LAB — LUPUS ANTICOAGULANT PANEL

## 2016-05-16 ENCOUNTER — Encounter: Payer: Self-pay | Admitting: Women's Health

## 2016-05-17 LAB — PAP, TP IMAGING W/ HPV RNA, RFLX HPV TYPE 16,18/45: HPV mRNA, High Risk: DETECTED — AB

## 2016-06-24 MED FILL — VALACYCLOVIR HCL 500 MG TAB: 500 | 90 days supply | Qty: 180 | Fill #0

## 2016-07-06 DIAGNOSIS — H5213 Myopia, bilateral: Secondary | ICD-10-CM | POA: Diagnosis not present

## 2016-08-09 ENCOUNTER — Encounter: Payer: Self-pay | Admitting: Women's Health

## 2016-08-11 DIAGNOSIS — Z0000 Encounter for general adult medical examination without abnormal findings: Secondary | ICD-10-CM | POA: Diagnosis not present

## 2016-08-11 DIAGNOSIS — R197 Diarrhea, unspecified: Secondary | ICD-10-CM | POA: Diagnosis not present

## 2016-08-11 LAB — VITAMIN D 25 HYDROXY (VIT D DEFICIENCY, FRACTURES): Vit D, 25-Hydroxy: 57.1

## 2016-08-11 LAB — BASIC METABOLIC PANEL
BUN: 6 (ref 4–21)
Creatinine: 0.6 (ref <=1.1)
Glucose: 88
Potassium: 3.6 (ref 3.4–5.3)
Sodium: 140 (ref 137–147)

## 2016-08-11 LAB — HEPATIC FUNCTION PANEL
ALT: 18 (ref 7–35)
AST: 17 (ref 13–35)
Alkaline Phosphatase: 38 (ref 25–125)
Bilirubin, Total: 0.5

## 2016-08-11 LAB — LIPID PANEL
Cholesterol: 141 (ref 0–200)
HDL: 68 (ref 35–70)
LDL Cholesterol: 62
LDl/HDL Ratio: 0.9
Triglycerides: 57 (ref 40–160)

## 2016-08-11 LAB — CBC AND DIFFERENTIAL
HCT: 36 (ref 36–46)
Hemoglobin: 12 (ref 12.0–16.0)
Platelets: 160 (ref 150–399)
WBC: 3.5

## 2016-08-12 DIAGNOSIS — R197 Diarrhea, unspecified: Secondary | ICD-10-CM | POA: Diagnosis not present

## 2016-08-26 DIAGNOSIS — R5383 Other fatigue: Secondary | ICD-10-CM | POA: Diagnosis not present

## 2016-09-08 ENCOUNTER — Encounter: Payer: Self-pay | Admitting: Women's Health

## 2016-09-08 ENCOUNTER — Ambulatory Visit (INDEPENDENT_AMBULATORY_CARE_PROVIDER_SITE_OTHER): Payer: 59 | Admitting: Women's Health

## 2016-09-08 VITALS — BP 112/60

## 2016-09-08 DIAGNOSIS — B977 Papillomavirus as the cause of diseases classified elsewhere: Secondary | ICD-10-CM

## 2016-09-08 DIAGNOSIS — R87810 Cervical high risk human papillomavirus (HPV) DNA test positive: Secondary | ICD-10-CM | POA: Diagnosis not present

## 2016-09-08 DIAGNOSIS — B001 Herpesviral vesicular dermatitis: Secondary | ICD-10-CM

## 2016-09-08 DIAGNOSIS — B009 Herpesviral infection, unspecified: Secondary | ICD-10-CM

## 2016-09-08 MED ORDER — VALACYCLOVIR HCL 500 MG PO TABS: 500.0000 mg | ORAL_TABLET | Freq: Two times a day (BID) | ORAL | 12 refills | Status: DC

## 2016-09-08 NOTE — Progress Notes (Signed)
Presents for repeat Pap. At annual exam 04/2016   normal Pap with positive HR HPV -16, 18, 45 requested repeat Pap in 4 months. 2013 had CIN-2 with laser treatment and normal Paps after. Same partner with negative STD screen. Has had increased night sweats several times per month. Continues to have a monthly cycle, cycles are every 24 days to 40 days. Long-term problems of anxiety and depression does see a therapist twice monthly.  Exam: Appears well. External genitalia within normal limits, speculum exam no visible discharge, cervix without visible lesion or discharge, Pap taken. Nontender.  04/2016 normal Pap, Positive HR HPV/ -16 18, 45 .  Plan: Will triage based on Pap results, proceed to colposcopy if persistent positive HR HPV. History of HSV-1 with continued pelvic pain currently on Valtrex 1000 daily will decrease to 500 daily. Has never had a HSV outbreak but continues with pelvic pain especially at ovulation and with menses. Has had no relief with homeopathic type treatment. Reviewed common to have occasional night sweats that occur with fluctuations with estrogen. Options of low dose birth control pills reviewed, declines will think about.

## 2016-09-09 DIAGNOSIS — R87612 Low grade squamous intraepithelial lesion on cytologic smear of cervix (LGSIL): Secondary | ICD-10-CM | POA: Diagnosis not present

## 2016-09-09 DIAGNOSIS — B977 Papillomavirus as the cause of diseases classified elsewhere: Secondary | ICD-10-CM | POA: Diagnosis not present

## 2016-09-15 LAB — PAP IG AND HPV HIGH-RISK: HPV DNA High Risk: DETECTED — AB

## 2016-10-08 ENCOUNTER — Encounter: Payer: Self-pay | Admitting: Gynecology

## 2016-10-08 ENCOUNTER — Ambulatory Visit (INDEPENDENT_AMBULATORY_CARE_PROVIDER_SITE_OTHER): Payer: 59 | Admitting: Gynecology

## 2016-10-08 VITALS — BP 102/72 | Ht 67.0 in | Wt 127.0 lb

## 2016-10-08 DIAGNOSIS — N841 Polyp of cervix uteri: Secondary | ICD-10-CM | POA: Diagnosis not present

## 2016-10-08 DIAGNOSIS — A630 Anogenital (venereal) warts: Secondary | ICD-10-CM | POA: Diagnosis not present

## 2016-10-08 DIAGNOSIS — R87612 Low grade squamous intraepithelial lesion on cytologic smear of cervix (LGSIL): Secondary | ICD-10-CM

## 2016-10-08 DIAGNOSIS — N870 Mild cervical dysplasia: Secondary | ICD-10-CM | POA: Diagnosis not present

## 2016-10-08 NOTE — Addendum Note (Signed)
Addended by: Dorothyann Gibbs on: 10/08/2016 03:23 PM   Modules accepted: Orders

## 2016-10-08 NOTE — Patient Instructions (Signed)
Colposcopy, Care After This sheet gives you information about how to care for yourself after your procedure. Your doctor may also give you more specific instructions. If you have problems or questions, contact your doctor. What can I expect after the procedure? If you did not have a tissue sample removed (did not have a biopsy), you may only have some spotting for a few days. You can go back to your normal activities. If you had a tissue sample removed, it is common to have:  Soreness and pain. This may last for a few days.  Light-headedness.  Mild bleeding from your vagina or dark-colored, grainy discharge from your vagina. This may last for a few days. You may need to wear a sanitary pad.  Spotting for at least 48 hours after the procedure. Follow these instructions at home:  Take over-the-counter and prescription medicines only as told by your doctor. Ask your doctor what medicines you can start taking again. This is very important if you take blood-thinning medicine.  Do not drive or use heavy machinery while taking prescription pain medicine.  For 3 days, or as long as your doctor tells you, avoid:  Douching.  Using tampons.  Having sex.  If you use birth control (contraception), keep using it.  Limit activity for the first day after the procedure. Ask your doctor what activities are safe for you.  It is up to you to get the results of your procedure. Ask your doctor when your results will be ready.  Keep all follow-up visits as told by your doctor. This is important. Contact a doctor if:  You get a skin rash. Get help right away if:  You are bleeding a lot from your vagina. It is a lot of bleeding if you are using more than one pad an hour for 2 hours in a row.  You have clumps of blood (blood clots) coming from your vagina.  You have a fever.  You have chills  You have pain in your lower belly (pelvic area).  You have signs of infection, such as vaginal  discharge that is:  Different than usual.  Yellow.  Bad-smelling.  You have very pain or cramps in your lower belly that do not get better with medicine.  You feel light-headed.  You feel dizzy.  You pass out (faint). Summary  If you did not have a tissue sample removed (did not have a biopsy), you may only have some spotting for a few days. You can go back to your normal activities.  If you had a tissue sample removed, it is common to have mild pain and spotting for 48 hours.  For 3 days, or as long as your doctor tells you, avoid douching, using tampons and having sex.  Get help right away if you have bleeding, very bad pain, or signs of infection. This information is not intended to replace advice given to you by your health care provider. Make sure you discuss any questions you have with your health care provider. Document Released: 12/01/2007 Document Revised: 03/03/2016 Document Reviewed: 03/03/2016 Elsevier Interactive Patient Education  2017 [employer]  

## 2016-10-08 NOTE — Progress Notes (Signed)
   Patient is a 36 year old that presented to the office today as a result of her recent Pap smear 09/08/2016 that demonstrated low-grade squamous patellar lesion with high-risk HPV detected. Patient with long-standing history of cervical dysplasia her history as follows:  2003 through 2006 normal Pap smears  2007 low-grade SIL with high-risk HPV not detected ( colposcopy negative biopsy 2007 and 2008)  2008 through 2011 normal Pap smears  2012 ASCUS  2013 high-grade squamous intraepithelial lesion: CIN-2/CIN-3 (HSIL) CO2 laser ablation of cervical dysplasia 2014 2015 negative Pap smears 2016 ASCUS high-risk HPV negative for HPV 16, 18 and 45 2017 Pap smear negative positive 2018 low-grade squamous epithelial lesion high-risk HPV detected  Patient using condoms for contraception and is here for colposcopic evaluation.  The patient underwent detail colposcopic evaluation external genitalia, perineum and perirectal region after acetic acid was applied and no lesions were noted. The speculum was introduced into the vagina and a systematic inspection vagina, vaginal fornix and cervix was undertaken. It was noted that patient had a cervical polyp and a leukoplakic area at the 12:00 position. The cervical biopsy from the 12:00 position was obtained first and then an ECC and the endocervical polyp was twisted office pedicle and a ring forcep and all specimen was submitted separately for histological evaluation. The transformation zone had been visualized and only silver nitrate was utilized for hemostasis. The findings and picture format as follows:   Physical Exam  Genitourinary:     Assessment/plan: Patient with low-grade squamous intraepithelial lesion high-risk HPV on recent Pap smear underwent detail colposcopic evaluation with the findings noted above. Patient with extensive past history of dysplasia as noted above as well. Appropriate biopsies were obtained as well as removing the cervical  polyp will await pathology report and manage according. Patient was given an Aleve for his comfort and instructions provided. She is to obtain from intercourse for 1 week.

## 2016-11-10 ENCOUNTER — Encounter: Payer: Self-pay | Admitting: Gynecology

## 2016-11-16 ENCOUNTER — Telehealth: Payer: Self-pay | Admitting: *Deleted

## 2016-11-16 NOTE — Telephone Encounter (Signed)
Pt informed, transferred to front.

## 2016-11-16 NOTE — Telephone Encounter (Signed)
Pt was told to schedule LEEP on 10/08/16 pt said she doesn't have the finances to have this done right now, wants your medical advice if okay to wait until the end of July to have done. I explained to patient sooner is always better than later, pt verbalized she understood.  Please advise

## 2016-11-16 NOTE — Telephone Encounter (Signed)
Yes that will be fine. 

## 2016-12-20 ENCOUNTER — Encounter: Payer: Self-pay | Admitting: Emergency Medicine

## 2016-12-20 ENCOUNTER — Telehealth: Payer: Self-pay | Admitting: Emergency Medicine

## 2016-12-20 NOTE — Telephone Encounter (Signed)
Pre visit call Complete.  Patients states she will check to see if she's had a TDap in the last 10 years.

## 2016-12-21 ENCOUNTER — Ambulatory Visit (INDEPENDENT_AMBULATORY_CARE_PROVIDER_SITE_OTHER): Payer: 59 | Admitting: Family Medicine

## 2016-12-21 ENCOUNTER — Encounter: Payer: Self-pay | Admitting: Family Medicine

## 2016-12-21 VITALS — BP 114/64 | HR 51 | Temp 98.4°F | Ht 65.75 in | Wt 131.8 lb

## 2016-12-21 DIAGNOSIS — R5382 Chronic fatigue, unspecified: Secondary | ICD-10-CM | POA: Diagnosis not present

## 2016-12-21 DIAGNOSIS — B9689 Other specified bacterial agents as the cause of diseases classified elsewhere: Secondary | ICD-10-CM

## 2016-12-21 DIAGNOSIS — N760 Acute vaginitis: Secondary | ICD-10-CM

## 2016-12-21 DIAGNOSIS — G9332 Myalgic encephalomyelitis/chronic fatigue syndrome: Secondary | ICD-10-CM

## 2016-12-21 DIAGNOSIS — Z760 Encounter for issue of repeat prescription: Secondary | ICD-10-CM | POA: Diagnosis not present

## 2016-12-21 MED ORDER — VALACYCLOVIR HCL 500 MG PO TABS: 500.0000 mg | ORAL_TABLET | Freq: Two times a day (BID) | ORAL | 11 refills | Status: DC

## 2016-12-21 MED FILL — VALACYCLOVIR HCL 500 MG TAB: 500 | 90 days supply | Qty: 180 | Fill #0

## 2016-12-21 NOTE — Patient Instructions (Addendum)
Pick up prescription at [facility].  [address], [place]  [id number]

## 2016-12-21 NOTE — Progress Notes (Signed)
Kristie [name] is a 36 y.o. female is here to [place].   Patient Care Team: Briscoe Deutscher, DO as PCP - General (Family Medicine)   History of Present Illness:   Kristie [name], CMA, acting as scribe for Dr. Juleen China.  HPI:  1. Chronic fatigue syndrome. Seems to improve what she is taking Valtrex. No new symptoms.     2. BV (bacterial vaginosis). Chronic. Uses boric acid suppository x 1 after every menses with good control of symptoms.    Health Maintenance Due  Topic Date Due  . TETANUS/TDAP  01/09/1998   PMHx, SurgHx, SocialHx, Medications, and Allergies were reviewed in the Visit Navigator and updated as appropriate.   Past Medical History:  Diagnosis Date  . Anxiety   . Asthma, exercise induced   . Dysplasia of cervix, high grade CIN 2   . HPV in female   . Migraines   . Seasonal allergies    Past Surgical History:  Procedure Laterality Date  . LASER ABLATION OF THE CERVIX  2013   CIN-2  . VAGINAL CYST EXCISED  2001   Family History  Problem Relation Age of Onset  . Hypertension Mother   . Breast cancer Maternal Aunt   . Prostate cancer Maternal Uncle   . Hypertension Maternal Grandmother   . Heart disease Maternal Grandmother   . Lung cancer Maternal Grandfather    Social History  Substance Use Topics  . Smoking status: Former Research scientist (life sciences)  . Smokeless tobacco: Former Systems developer    Quit date: 09/17/2003  . Alcohol use No   Current Medications and Allergies:   .  valACYclovir (VALTREX) 500 MG tablet, Take 1 tablet (500 mg total) by mouth 2 (two) times daily. (Patient taking differently: Take 500 mg by mouth 2 (two) times daily. 1-2 times daily as needed), Disp: 30 tablet, Rfl: 12  Allergies  Allergen Reactions  . Acyclovir And Related Other (See Comments)    Abdominal pain, nausea, and dizziness.  . Sertraline Hcl Other (See Comments)    Paranoia.   Kristie [name] [Tinidazole]    Review of Systems:   Review of Systems  Constitutional: Negative for chills,  fever, malaise/fatigue and weight loss.  Respiratory: Negative for cough, shortness of breath and wheezing.   Cardiovascular: Negative for chest pain, palpitations and leg swelling.  Gastrointestinal: Negative for abdominal pain, constipation, diarrhea, nausea and vomiting.  Genitourinary: Negative for dysuria and urgency.  Musculoskeletal: Negative for joint pain and myalgias.  Skin: Negative for rash.  Neurological: Negative for dizziness and headaches.  Psychiatric/Behavioral: Negative for depression, substance abuse and suicidal ideas. The patient is not nervous/anxious.    Vitals:   Vitals:   12/21/16 1442  BP: 114/64  Pulse: (!) 51  Temp: 98.4 F (36.9 C)  TempSrc: Oral  SpO2: 99%  Weight: 131 lb 12.8 oz (59.8 kg)  Height: 5' 5.75" (1.67 m)     Body mass index is 21.44 kg/m.  Physical Exam:   Physical Exam  Constitutional: She appears well-developed and well-nourished. No distress.  HENT:  Head: Normocephalic and atraumatic.  Eyes: EOM are normal. Pupils are equal, round, and reactive to light.  Neck: Normal range of motion. Neck supple.  Cardiovascular: Normal rate, regular rhythm, normal heart sounds and intact distal pulses.   Pulmonary/Chest: Effort normal.  Abdominal: Soft.  Skin: Skin is warm.  Psychiatric: She has a normal mood and affect. Her behavior is normal.  Nursing note and vitals reviewed.  Assessment and Plan:  Tymira was seen today for establish care.  Diagnoses and all orders for this visit:  Chronic fatigue syndrome Comments: Generally controlled with excercise, stress reduction, healthy food choices. Patient prefers Naturopathic Medicine.  Orders: -     valACYclovir (VALTREX) 500 MG tablet; Take 1 tablet (500 mg total) by mouth 2 (two) times daily.  BV (bacterial vaginosis) Comments: Chronic. Boric acid suppositories sent to pharmacy per previous instructions. Red flags reviewed.     . Reviewed expectations re: course of current  medical issues. . Discussed self-management of symptoms. . Outlined signs and symptoms indicating need for more acute intervention. . Patient verbalized understanding and all questions were answered. Marland Kitchen Health Maintenance issues including appropriate healthy diet, exercise, and smoking avoidance were discussed with patient. . See orders for this visit as documented in the electronic medical record. . Patient received an After Visit Summary.  CMA served as Education administrator during this visit. History, Physical, and Plan performed by medical provider. The above documentation has been reviewed and is accurate and complete. Briscoe Deutscher, D.O.  Briscoe Deutscher, DO [place], [place] 12/25/2016  Future Appointments Date Time Provider Benjamin  01/20/2017 2:00 PM Terrance Mass, MD [id number]

## 2016-12-25 ENCOUNTER — Encounter: Payer: Self-pay | Admitting: Family Medicine

## 2017-01-20 ENCOUNTER — Encounter: Payer: Self-pay | Admitting: Gynecology

## 2017-01-20 ENCOUNTER — Ambulatory Visit (INDEPENDENT_AMBULATORY_CARE_PROVIDER_SITE_OTHER): Payer: 59 | Admitting: Gynecology

## 2017-01-20 DIAGNOSIS — Z87410 Personal history of cervical dysplasia: Secondary | ICD-10-CM | POA: Diagnosis not present

## 2017-01-20 DIAGNOSIS — N870 Mild cervical dysplasia: Secondary | ICD-10-CM

## 2017-01-20 NOTE — Patient Instructions (Signed)

## 2017-01-20 NOTE — Progress Notes (Signed)
Patient is a 36 year old who has decided not to proceed with the recommended LEEP conization because of recurrent cervical dysplasia as was discussed back in April of this year. Her history is as follows:  2003 through 2006 normal Pap smears  2007 low-grade SIL with high-risk HPV not detected ( colposcopy negative biopsy 2007 and 2008)  2008 through 2011 normal Pap smears normal 2012 ASCUS  2013 high-grade squamous intraepithelial lesion: CIN-2/CIN-3 (HSIL) CO2 laser ablation of cervical dysplasia 2014 2015 negative Pap smears 2016 ASCUS high-risk HPV negative for HPV 16, 18 and 45 2017 Pap smear negative positive 2018 low-grade squamous epithelial lesion high-risk HPV detected  Colposcopic directed biopsy April 2018 as follows: The patient underwent detail colposcopic evaluation external genitalia, perineum and perirectal region after acetic acid was applied and no lesions were noted. The speculum was introduced into the vagina and a systematic inspection vagina, vaginal fornix and cervix was undertaken. It was noted that patient had a cervical polyp and a leukoplakic area at the 12:00 position. The cervical biopsy from the 12:00 position was obtained first and then an ECC and the endocervical polyp was twisted office pedicle and a ring forcep and all specimen was submitted separately for histological evaluation. The transformation zone had been visualized and only silver nitrate was utilized for hemostasis. The findings and picture format as follows:   Physical Exam  Genitourinary:    Pathology report demonstrated the following: Diagnosis Cervix, polyp - BENIGN CERVICAL POLYP  Diagnosis Endocervix, curettage - BENIGN ENDOCERVIX.  Diagnosis Cervix, biopsy, 12:00 o'clock ecto - LOW GRADE SQUAMOUS INTRAEPITHELIAL LESION, CIN-I (MILD DYSPLASIA).  Patient today underwent a detail colposcopic evaluation external genitalia perineum perirectal region no lesions seen. The speculum  was introduced into the vagina and a systematic inspection once again demonstrated acetowhite area at the 12:00 position and around the cervix as follows:  Physical Exam  Genitourinary:     Assessment/plan: Patient with recurrent cervical dysplasia and findings as noted above underwent LEEP cervical conization and excision of the small island as described above of the ectocervix:    Procedure:  LEEP (Loop electrosurgical excision procedure) Description of Operation:  After the patient was verbally counseled the patient was placed in the low lithotomy position.  A coated speculum was inserted into the vagina and colposcopic examination was performed with 4% acidic acid with findings noted above.  The cervix was then painted with Lugol's solution to delineate the margins of the lesion and transformation zone.  Approximately 5 cc's of 2% xylocaine with epinephrine was infiltrated deep near the outer margin of the transformation zone circumferentially at 12, 3, 6, and 9 o'clock positions.  The [facility] Electrosurgical Generator was then turned on after the patient was grounded with pad electrode on her thigh and jewelry removed.  The settings on the generator were Blend 1 current 70 watts cut and 70 watts on the coagulation mode.  A size 10 x 12 loop electrode was utilized to exercise the atypical transformation zone.  The tip of the electrode was placed 3 mm from the edge of the lesion at 3, 6, 9 and 12 o'clock position.  The electrode was moved slowly over the lesion was within the loop limits.  A vaginal wall retractor was not used.  The loop was then repositioned and the finger switch on the hand held piece was activated ( or footpedal depressed).  A slight pressure on the shaft was applied and the loop was extended into the tissue up  to its crossbar to a depth of 6 mm, then with steady, slow motion across and underneath the endocervical button was not excised.  The loop electrode was  replaced with ball electrode set at 50 watts and the base of the crater was fulgurated circumferentially.  Monsell's paint was then applied for additional hemostasis.  Colored pens  were used to position to mark the specimen and placed on cork board for orientation, and placed in formalin fixative for pathology evaluation.  Patient tolerated the procedure well with minimal blood loss and without any complications.  After the procedure patient left office with stable vital signs and instructions sheet.   [facility], [place] HMD3:38 PMTD@

## 2017-01-26 ENCOUNTER — Encounter: Payer: Self-pay | Admitting: Internal Medicine

## 2017-01-31 ENCOUNTER — Telehealth: Payer: Self-pay | Admitting: Internal Medicine

## 2017-01-31 NOTE — Telephone Encounter (Signed)
Kristie [name],  I'm glad to hear that you're doing better. I have removed chronic fatigue syndrome from your problem list.  Jenny Reichmann  From: [name]  Sent: 01/26/2017  3:10 PM  To: [facility] Clinical Pool  Subject: Non-Urgent Medical Question             Hello Dr. Megan Salon,    I hope you are well. I saw you for a one time visit August 15,2017. At that time you said I did not have HSV nor RMSF. You did ask me to think on the diagnosis of Chronic fatigue syndrome. I thought I remembered you saying that you were not going label me and wanted me to take it into consideration. I have since discovered that Chronic fatigue syndrome is on my problem list. I do not feel that this is a diagnosis that should be in my chart as I am cycling several times a week and feeling great afterwards. I welcome a phone call if necessary to discuss. [phone]. Please let me know if this can be resolved or removed from my chart. Thanks for your time with this!   [name]

## 2017-02-07 ENCOUNTER — Encounter: Payer: Self-pay | Admitting: Family Medicine

## 2017-02-09 ENCOUNTER — Encounter: Payer: Self-pay | Admitting: Women's Health

## 2017-02-09 ENCOUNTER — Ambulatory Visit (INDEPENDENT_AMBULATORY_CARE_PROVIDER_SITE_OTHER): Payer: 59 | Admitting: Women's Health

## 2017-02-09 VITALS — BP 118/80 | Ht 65.0 in | Wt 131.0 lb

## 2017-02-09 DIAGNOSIS — Z9889 Other specified postprocedural states: Secondary | ICD-10-CM

## 2017-02-09 NOTE — Progress Notes (Signed)
Presents for follow-up LEEP on 01/20/2017 for persistent LGSIL per Dr. Toney Rakes.. Still having a scant yellowish discharge without itching or odor. Denies urinary symptoms, abdominal pain or fever. Not sexually active. Monthly cycle.  Exam: Appears well. External genitalia within normal limits, speculum exam mild erythema at LEEP site, scant white discharge, bimanual no CMT or tenderness with exam.  Healing LEEP  Plan: Resume activities. Repeat Pap in one year.

## 2017-02-16 ENCOUNTER — Ambulatory Visit (INDEPENDENT_AMBULATORY_CARE_PROVIDER_SITE_OTHER): Payer: 59 | Admitting: Sports Medicine

## 2017-02-16 ENCOUNTER — Encounter: Payer: Self-pay | Admitting: Sports Medicine

## 2017-02-16 DIAGNOSIS — Q667 Congenital pes cavus, unspecified foot: Secondary | ICD-10-CM

## 2017-02-16 DIAGNOSIS — G8929 Other chronic pain: Secondary | ICD-10-CM

## 2017-02-16 DIAGNOSIS — M546 Pain in thoracic spine: Secondary | ICD-10-CM | POA: Diagnosis not present

## 2017-02-16 NOTE — Progress Notes (Signed)
OFFICE VISIT NOTE Kristie [name]. Kristie [name], Kristie [name] at [place]  MERISSA RENWICK - 36 y.o. female MRN [id number]  Date of birth: 1981-02-21  Visit Date: 02/16/2017  PCP: Briscoe Deutscher, DO   Referred by: Briscoe Deutscher, DO  Burlene Arnt, CMA acting as scribe for Dr. Paulla Fore.  SUBJECTIVE:   Chief Complaint  Patient presents with  . New Patient (Initial Visit)    rt upper back/shoulder pain, tension HA, tingling in lt foot while bike riding   HPI: As below and per problem based documentation when appropriate.  Trease is a new patient presenting today with complaint of rt upper back/shoulder pain, tension HA, and tingling in the lt foot while bike riding. She would like bike fitting today.     Review of Systems  Constitutional: Negative for chills and fever.  Respiratory: Negative for shortness of breath and wheezing.   Cardiovascular: Negative for chest pain and palpitations.  Musculoskeletal: Positive for joint pain and myalgias. Negative for falls.  Skin: Positive for rash.  Neurological: Positive for tingling and headaches. Negative for dizziness.  Endo/Heme/Allergies: Does not bruise/bleed easily.    Otherwise per HPI.  HISTORY & PERTINENT PRIOR DATA:  No specialty comments available. She reports that she has quit smoking. She quit smokeless tobacco use about 13 years ago.   Recent Labs  04/12/16  HGBA1C 4.9   Medications & Allergies reviewed per EMR Patient Active Problem List   Diagnosis Date Noted  . Thoracic back pain 02/18/2017  . Congenital cavus foot 02/18/2017  . History of cervical dysplasia 01/20/2017  . BV (bacterial vaginosis) 12/21/2016  . HSV-1 (herpes simplex virus 1) infection 07/08/2015  . Migraines 08/05/2011  . Allergic rhinitis 06/12/2010  . Asthma 06/05/2010   Past Medical History:  Diagnosis Date  . Anxiety   . Asthma, exercise induced   . Dysplasia of cervix, high grade CIN 2   .  HPV in female   . Migraines   . Seasonal allergies    Family History  Problem Relation Age of Onset  . Hypertension Mother   . Breast cancer Maternal Aunt   . Prostate cancer Maternal Uncle   . Hypertension Maternal Grandmother   . Heart disease Maternal Grandmother   . Lung cancer Maternal Grandfather    Past Surgical History:  Procedure Laterality Date  . LASER ABLATION OF THE CERVIX  2013   CIN-2  . VAGINAL CYST EXCISED  2001   Social History   Occupational History  . RN [employer]    [employer]   Social History Main Topics  . Smoking status: Former Research scientist (life sciences)  . Smokeless tobacco: Former Systems developer    Quit date: 09/17/2003  . Alcohol use No  . Drug use: No  . Sexual activity: Yes    Birth control/ protection: Condom    OBJECTIVE:  VS:  HT:5\' 5"  (165.1 cm)   WT:131 lb (59.4 kg)  BMI:21.8    BP:110/72  HR:(!) 50bpm  TEMP: ( )  RESP:99 % EXAM: Findings:  Adult female.  No acute distress.  Alert and appropriate.  Bilateral lower extremities overall well aligned.  Quite muscular.  High cavus foot.  She has good ankle mobility.  Mild pain with palpation of the metatarsals but this is minimal.  No focal pain.  Small Morton's callus bilaterally.  First ray insufficiency.  Overall good flexibility and symmetric leg lengths as well as symmetric hip flexors.  No results found. ASSESSMENT & PLAN:     ICD-10-CM   1. Chronic bilateral thoracic back pain M54.6    G89.29   2. Congenital cavus foot Q66.7   ================================================================= Congenital cavus foot Longitudinal metatarsal pads added to her shoes today.  She will continue using these for her day-to-day shoes as well as over-the-counter results.  Can look into obtaining custom cushion orthotics if persistent foot problems.  Patient reports that since beginning cycle and she has had worsening upper thoracic back pain that is constant.  This does worsen with cycling  intermittently but sometimes also feels better.  She has noted some associated ulnar neuritis and medical bike fit indicated and performed today.  She was initially and a very extended position with a seat position that was approximately 2 cm rearward to far.  She had good improvement with moving the central for as well as change in the q. factor of her pedicles.  Do recommend she look into obtaining a shorter  stem she is possibly slightly overreached spelled like her to try the new position comparison today prior to purchasing this.  Additional modifications to her physician included the addition of medium longitudinal metatarsal pads to her shoes that provide significant improvement.  She has markedly high cavus foot.  Reported overall good improvement in the areas she has been getting hot spot. >50% of this visit spent in direct patient counseling and/or coordination of care.  Discussion and evaluation of bicycle fit was focused on education and evaluation of appropriate bike biomechanics.  Appointment time 330, patient left at 530.  Direct face-to-face time was greater than 60 minutes.  ================================================================= Patient Instructions  If you are still having pain try getting a 25mm stem 7degree +/-   Look into having your insurance company cover a set of custom orthotics.  The code is L3030 and there are 2 units.  You can call them  and ask if this is covered.  I am happy to do these for you at any time, you just need to let our front office schedulers know you would like an "orthotic appointment."  ================================================================= No future appointments.  Follow-up: Return if symptoms worsen or fail to improve.   CMA/ATC served as Education administrator during this visit. History, Physical, and Plan performed by medical provider. Documentation and orders reviewed and attested to.      Teresa Coombs, [facility]  Physician

## 2017-02-16 NOTE — Patient Instructions (Signed)
If you are still having pain try getting a 39mm stem 7degree +/-   Look into having your insurance company cover a set of custom orthotics.  The code is L3030 and there are 2 units.  You can call them  and ask if this is covered.  I am happy to do these for you at any time, you just need to let our front office schedulers know you would like an "orthotic appointment."

## 2017-02-17 ENCOUNTER — Ambulatory Visit (INDEPENDENT_AMBULATORY_CARE_PROVIDER_SITE_OTHER): Payer: 59 | Admitting: Physician Assistant

## 2017-02-17 ENCOUNTER — Telehealth: Payer: Self-pay | Admitting: Family Medicine

## 2017-02-17 ENCOUNTER — Encounter: Payer: Self-pay | Admitting: Physician Assistant

## 2017-02-17 VITALS — BP 110/66 | HR 54 | Temp 98.7°F | Ht 65.0 in | Wt 131.0 lb

## 2017-02-17 DIAGNOSIS — R21 Rash and other nonspecific skin eruption: Secondary | ICD-10-CM

## 2017-02-17 MED ORDER — METHYLPREDNISOLONE ACETATE 40 MG/ML IJ SUSP
40.0000 mg | Freq: Once | INTRAMUSCULAR | Status: AC
  Administered 2017-02-17: 40 mg via INTRAMUSCULAR

## 2017-02-17 NOTE — Patient Instructions (Signed)
It was great to see you!  Try cool compresses for the itching. Also try an antihistamine such as Allegra, Zyrtec or Benadryl (generic is fine too).  Let us know if the rash worsens or you develop any worsening redness, pus-like discharge, fever, or other concerns.   Rash A rash is a change in the color of the skin. A rash can also change the way your skin feels. There are many different conditions and factors that can cause a rash. Follow these instructions at home: Pay attention to any changes in your symptoms. Follow these instructions to help with your condition: Medicine Take or apply over-the-counter and prescription medicines only as told by your health care provider. These may include:  Corticosteroid cream.  Anti-itch lotions.  Oral antihistamines.  Skin Care  Apply cool compresses to the affected areas.  Try taking a bath with: ? Epsom salts. Follow the instructions on the packaging. You can get these at your local pharmacy or grocery store. ? Baking soda. Pour a small amount into the bath as told by your health care provider. ? Colloidal oatmeal. Follow the instructions on the packaging. You can get this at your local pharmacy or grocery store.  Try applying baking soda paste to your skin. Stir water into baking soda until it reaches a paste-like consistency.  Do not scratch or rub your skin.  Avoid covering the rash. Make sure the rash is exposed to air as much as possible. General instructions  Avoid hot showers or baths, which can make itching worse. A cold shower may help.  Avoid scented soaps, detergents, and perfumes. Use gentle soaps, detergents, perfumes, and other cosmetic products.  Avoid any substance that causes your rash. Keep a journal to help track what causes your rash. Write down: ? What you eat. ? What cosmetic products you use. ? What you drink. ? What you wear. This includes jewelry.  Keep all follow-up visits as told by your health care  provider. This is important. Contact a health care provider if:  You sweat at night.  You lose weight.  You urinate more than normal.  You feel weak.  You vomit.  Your skin or the whites of your eyes look yellow (jaundice).  Your skin: ? Tingles. ? Is numb.  Your rash: ? Does not go away after several days. ? Gets worse.  You are: ? Unusually thirsty. ? More tired than normal.  You have: ? New symptoms. ? Pain in your abdomen. ? A fever. ? Diarrhea. Get help right away if:  You develop a rash that covers all or most of your body. The rash may or may not be painful.  You develop blisters that: ? Are on top of the rash. ? Grow larger or grow together. ? Are painful. ? Are inside your nose or mouth.  You develop a rash that: ? Looks like purple pinprick-sized spots all over your body. ? Has a "bull's eye" or looks like a target. ? Is not related to sun exposure, is red and painful, and causes your skin to peel. This information is not intended to replace advice given to you by your health care provider. Make sure you discuss any questions you have with your health care provider. Document Released: 06/04/2002 Document Revised: 11/18/2015 Document Reviewed: 10/30/2014 Elsevier Interactive Patient Education  2017 [employer].

## 2017-02-17 NOTE — Telephone Encounter (Signed)
Noted  

## 2017-02-17 NOTE — Telephone Encounter (Signed)
Patient is scheduled for itchy bumps down from the torso down with Inda Coke, PA at 10:30am this morning.

## 2017-02-17 NOTE — Progress Notes (Signed)
UVA RUNKEL is a 36 y.o. female here for itchy bumps on torso.  I acted as a Education administrator for [employer], PA-C Anselmo Pickler, LPN  History of Present Illness:   Chief Complaint  Patient presents with  . Rash    Rash  This is a new problem. Episode onset: started a week ago, but got worse past few days. The problem has been gradually worsening since onset. The rash is diffuse (from waist down.). The rash is characterized by redness, blistering and itchiness. She was exposed to nothing. Pertinent negatives include no fever, joint pain or shortness of breath. (Feels a little warm today.) Treatments tried: tea tree and lavendar aromatherapy.   No hot tubs. Has not tried any oral antihistamines.  She has been outside sitting in grass more than usual over the past few weeks. Lives alone with a dog -- denies dog with similar issues or concerns for fleas. No recent hotel stays or concerns for bug bites. Denies tick bites.  Past Medical History:  Diagnosis Date  . Anxiety   . Asthma, exercise induced   . Dysplasia of cervix, high grade CIN 2   . HPV in female   . Migraines   . Seasonal allergies      Social History   Social History  . Marital status: Single    Spouse name: N/A  . Number of children: 0  . Years of education: N/A   Occupational History  . 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 Smoking status: Former Research scientist (life sciences)  . Smokeless tobacco: Former Systems developer    Quit date: 09/17/2003  . Alcohol use No  . Drug use: No  . Sexual activity: Yes    Birth control/ protection: Condom   Other Topics Concern  . 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 Involved in [employer].       Prefers Naturopathic Medicine.     Past Surgical History:  Procedure Laterality Date  . LASER ABLATION OF THE CERVIX  2013   CIN-2  . VAGINAL CYST EXCISED  2001    Family History  Problem Relation Age of Onset  . Hypertension  Mother   . Breast cancer Maternal Aunt   . Prostate cancer Maternal Uncle   . Hypertension Maternal Grandmother   . Heart disease Maternal Grandmother   . Lung cancer Maternal Grandfather     Allergies  Allergen Reactions  . Acyclovir And Related Other (See Comments)    Abdominal pain, nausea, and dizziness.  . Sertraline Hcl Other (See Comments)    Paranoia.   . Tindamax [Tinidazole]     Current Medications:   Current Outpatient Prescriptions:  .  valACYclovir (VALTREX) 500 MG tablet, Take 1 tablet (500 mg total) by mouth 2 (two) times daily. (Patient taking differently: Take 500 mg by mouth 2 (two) times daily. 1-2 times daily as needed), Disp: 30 tablet, Rfl: 12   Review of Systems:   Review of Systems  Constitutional: Negative for chills, fever, malaise/fatigue and weight loss.  Eyes: Negative for blurred vision and double vision.  Respiratory: Negative for shortness of breath.   Cardiovascular: Negative for chest pain.  Musculoskeletal: Negative for back pain and joint pain.  Skin: Positive for rash.    Vitals:   Vitals:   02/17/17 1042  BP: 110/66  Pulse: (!) 54  Temp: 98.7 F (37.1 C)  TempSrc: Oral  SpO2: 99%  Weight: 131 lb (59.4 kg)  Height: 5\' 5"  (1.651 m)     Body mass index is 21.8 kg/m.  Physical Exam:   Physical Exam  Constitutional: She appears well-developed. She is cooperative.  Non-toxic appearance. She does not have a sickly appearance. She does not appear ill. No distress.  Cardiovascular: Normal rate, regular rhythm, S1 normal, S2 normal, normal heart sounds and normal pulses.   No LE edema  Pulmonary/Chest: Effort normal and breath sounds normal.  Neurological: She is alert. GCS eye subscore is 4. GCS verbal subscore is 5. GCS motor subscore is 6.  Skin: Skin is warm, dry and intact.  Multiple scattered erythematous papules along bilateral hips, posterior thighs and lower legs. No open areas or oozing observed. Lesions not tender. No  streaking redness. All lesions approximately 47mm in size.  Psychiatric: She has a normal mood and affect. Her speech is normal and behavior is normal.  Nursing note and vitals reviewed.   Assessment and Plan:    Lyla was seen today for rash.  Diagnoses and all orders for this visit:  Rash Suspect some sort of irritant dermatitis from outdoor exposure. She was agreeable to 40mg  depomedrol to help with itching. Advised against further application of essential oils. Recommend use of oral antihistamine to help with itching and cool compresses. We briefly discussed topical cream, however she declined for now. No evidence of infection on my exam today. Reviewed red flags and warning signs, return to clinic if any concerns or lack of improvement despite treatment. -     methylPREDNISolone acetate (DEPO-MEDROL) injection 40 mg; Inject 1 mL (40 mg total) into the muscle once.  . Reviewed expectations re: course of current medical issues. . Discussed self-management of symptoms. . Outlined signs and symptoms indicating need for more acute intervention. . Patient verbalized understanding and all questions were answered. . See orders for this visit as documented in the electronic medical record. . Patient received an After-Visit Summary.  CMA or LPN served as scribe during this visit. History, Physical, and Plan performed by medical provider. Documentation and orders reviewed and attested to.  Inda Coke, PA-C

## 2017-02-18 DIAGNOSIS — M546 Pain in thoracic spine: Secondary | ICD-10-CM | POA: Insufficient documentation

## 2017-02-18 DIAGNOSIS — Q667 Congenital pes cavus, unspecified foot: Secondary | ICD-10-CM | POA: Insufficient documentation

## 2017-02-18 NOTE — Assessment & Plan Note (Signed)
Longitudinal metatarsal pads added to her shoes today.  She will continue using these for her day-to-day shoes as well as over-the-counter results.  Can look into obtaining custom cushion orthotics if persistent foot problems.

## 2017-03-25 MED FILL — VALACYCLOVIR HCL 500 MG TAB: 500 | 90 days supply | Qty: 180 | Fill #1

## 2017-04-07 DIAGNOSIS — R5383 Other fatigue: Secondary | ICD-10-CM | POA: Diagnosis not present

## 2017-04-07 DIAGNOSIS — B379 Candidiasis, unspecified: Secondary | ICD-10-CM | POA: Diagnosis not present

## 2017-04-07 DIAGNOSIS — N943 Premenstrual tension syndrome: Secondary | ICD-10-CM | POA: Diagnosis not present

## 2017-04-07 DIAGNOSIS — K929 Disease of digestive system, unspecified: Secondary | ICD-10-CM | POA: Diagnosis not present

## 2017-04-07 DIAGNOSIS — R51 Headache: Secondary | ICD-10-CM | POA: Diagnosis not present

## 2017-04-07 DIAGNOSIS — E559 Vitamin D deficiency, unspecified: Secondary | ICD-10-CM | POA: Diagnosis not present

## 2017-04-11 LAB — IRON,TIBC AND FERRITIN PANEL: Ferritin: 50

## 2017-04-11 LAB — HEMOGLOBIN A1C: Hemoglobin A1C: 5

## 2017-04-11 LAB — CBC AND DIFFERENTIAL
HCT: 42 (ref 36–46)
Hemoglobin: 13.6 (ref 12.0–16.0)
Neutrophils Absolute: 2
Platelets: 176 (ref 150–399)
WBC: 4.8

## 2017-04-11 LAB — VITAMIN B12: Vitamin B-12: 723

## 2017-04-11 LAB — TSH: TSH: 1.14 (ref 0.41–5.90)

## 2017-05-12 DIAGNOSIS — R5383 Other fatigue: Secondary | ICD-10-CM | POA: Diagnosis not present

## 2017-05-12 DIAGNOSIS — N943 Premenstrual tension syndrome: Secondary | ICD-10-CM | POA: Diagnosis not present

## 2017-05-12 DIAGNOSIS — R52 Pain, unspecified: Secondary | ICD-10-CM | POA: Diagnosis not present

## 2017-05-12 DIAGNOSIS — B379 Candidiasis, unspecified: Secondary | ICD-10-CM | POA: Diagnosis not present

## 2017-05-16 ENCOUNTER — Encounter: Payer: Self-pay | Admitting: Family Medicine

## 2017-05-17 ENCOUNTER — Other Ambulatory Visit: Payer: Self-pay | Admitting: Surgical

## 2017-05-17 MED ORDER — ALBUTEROL SULFATE HFA 108 (90 BASE) MCG/ACT IN AERS: 2.0000 | INHALATION_SPRAY | Freq: Four times a day (QID) | RESPIRATORY_TRACT | 0 refills | Status: DC | PRN

## 2017-05-17 MED FILL — VENTOLIN HFA 90 MCG INHALER: 108 (90 BAS | 25 days supply | Qty: 18 | Fill #0

## 2017-05-31 DIAGNOSIS — Z23 Encounter for immunization: Secondary | ICD-10-CM | POA: Diagnosis not present

## 2017-05-31 DIAGNOSIS — D2339 Other benign neoplasm of skin of other parts of face: Secondary | ICD-10-CM | POA: Diagnosis not present

## 2017-05-31 DIAGNOSIS — L300 Nummular dermatitis: Secondary | ICD-10-CM | POA: Diagnosis not present

## 2017-05-31 DIAGNOSIS — L723 Sebaceous cyst: Secondary | ICD-10-CM | POA: Diagnosis not present

## 2017-06-02 MED FILL — CHOLESTYRAMINE LIGHT POWDER: 4 | 42 days supply | Qty: 210 | Fill #0

## 2017-07-12 MED FILL — CHOLESTYRAMINE LIGHT POWDER: 4 | 42 days supply | Qty: 210 | Fill #1

## 2017-07-12 MED FILL — VALACYCLOVIR HCL 500 MG TAB: 500 | 90 days supply | Qty: 180 | Fill #2

## 2017-07-28 DIAGNOSIS — N943 Premenstrual tension syndrome: Secondary | ICD-10-CM | POA: Diagnosis not present

## 2017-07-28 DIAGNOSIS — B379 Candidiasis, unspecified: Secondary | ICD-10-CM | POA: Diagnosis not present

## 2017-07-28 DIAGNOSIS — R5383 Other fatigue: Secondary | ICD-10-CM | POA: Diagnosis not present

## 2017-07-28 DIAGNOSIS — E039 Hypothyroidism, unspecified: Secondary | ICD-10-CM | POA: Diagnosis not present

## 2017-08-04 MED FILL — CHOLESTYRAMINE LIGHT POWDER: 4 | 84 days supply | Qty: 840 | Fill #0

## 2017-08-05 ENCOUNTER — Ambulatory Visit: Payer: Self-pay | Admitting: *Deleted

## 2017-08-05 NOTE — Telephone Encounter (Signed)
Called in c/o feeling sore, tight in her left neck area into her left shoulder.  She is also having tingling into her left arm and into her hand that is intermittent.  She is also experiencing soreness and tightness between her shoulder blades.  She said she sees a chiropractor who said she has thoracic outlet syndrome but she wants to be further evaluated by Dr. Juleen China with x-rays if needed to make sure everything is ok with her spine.   She has had a problem with upper back pain before. 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   The agent made her an appt with Dr. Juleen China for 08/09/17 at 3:40.  I instructed her to call us back if her symptoms became worse.  She verbalized understanding.  Reason for Disposition . Pain shoots (radiates) into arm or hand  Answer Assessment - Initial Assessment Questions 1. ONSET: "When did the pain begin?"      3 weeks ago I noticed tingling in the palm of my hand on the left. 2. LOCATION: "Where does it hurt?"      I'm feeling tight between my shoulder blades, neck and my left side of my neck sore and tight.  My muscle from my left neck into my left shoulder is sore and tight.     My chiropractor  said I have thoracic outlet syndrome.  I just want to be followed up. 3. PATTERN "Does the pain come and go, or has it been constant since it started?"      It's intermittent except the soreness and stiffness is constant. 4. SEVERITY: "How bad is the pain?"  (Scale 1-10; or mild, moderate, severe)   - MILD (1-3): doesn't interfere with normal activities    - MODERATE (4-7): interferes with normal activities or awakens from sleep    - SEVERE (8-10):  excruciating pain, unable to do any normal activities      6-7 on pain scale. 5. RADIATION: "Does the pain go anywhere else, shoot into your arms?"     Headaches.    6. CORD SYMPTOMS: "Any weakness or numbness of the arms or legs?"     Tingling in  my left arm. 7. CAUSE: "What do you think is causing the neck pain?"     My neck muscle is sore. 8. NECK OVERUSE: "Any recent activities that involved turning or twisting the neck?"     No 9. OTHER SYMPTOMS: "Do you have any other symptoms?" (e.g., headache, fever, chest pain, difficulty breathing, neck swelling)     See above 10. PREGNANCY: "Is there any chance you are pregnant?" "When was your last menstrual period?"       No.  Protocols used: NECK PAIN OR STIFFNESS-A-AH

## 2017-08-09 ENCOUNTER — Encounter: Payer: Self-pay | Admitting: Family Medicine

## 2017-08-09 ENCOUNTER — Ambulatory Visit: Payer: 59 | Admitting: Family Medicine

## 2017-08-09 VITALS — BP 118/60 | HR 73 | Temp 98.6°F | Wt 129.4 lb

## 2017-08-09 DIAGNOSIS — M62838 Other muscle spasm: Secondary | ICD-10-CM | POA: Diagnosis not present

## 2017-08-09 DIAGNOSIS — R202 Paresthesia of skin: Secondary | ICD-10-CM

## 2017-08-09 MED ORDER — DIAZEPAM 5 MG PO TABS: 5.0000 mg | ORAL_TABLET | Freq: Two times a day (BID) | ORAL | 1 refills | Status: DC | PRN

## 2017-08-09 MED ORDER — NAPROXEN 500 MG PO TABS: 500.0000 mg | ORAL_TABLET | Freq: Two times a day (BID) | ORAL | 0 refills | Status: DC

## 2017-08-09 MED FILL — diazePAM 5 MG TABS: 5 | 10 days supply | Qty: 20 | Fill #0

## 2017-08-09 MED FILL — NAPROXEN 500 MG TABLET: 500 | 15 days supply | Qty: 30 | Fill #0

## 2017-08-09 NOTE — Progress Notes (Signed)
Kristie [name] is a 37 y.o. female is here for follow up.  History of Present Illness:   HPI: See Assessment and Plan section for Problem Based Charting of issues discussed today.   There are no preventive care reminders to display for this patient. Depression screen PHQ 2/9 02/10/2016  Decreased Interest 0  Down, Depressed, Hopeless 0  PHQ - 2 Score 0   PMHx, SurgHx, SocialHx, FamHx, Medications, and Allergies were reviewed in the Visit Navigator and updated as appropriate.   Patient Active Problem List   Diagnosis Date Noted  . Thoracic back pain 02/18/2017  . Congenital cavus foot 02/18/2017  . History of cervical dysplasia 01/20/2017  . BV (bacterial vaginosis) 12/21/2016  . HSV-1 (herpes simplex virus 1) infection 07/08/2015  . Migraines 08/05/2011  . Allergic rhinitis 06/12/2010  . Asthma 06/05/2010   Social History   Tobacco Use  . Smoking status: Former Research scientist (life sciences)  . Smokeless tobacco: Former Systems developer    Quit date: 09/17/2003  Substance Use Topics  . Alcohol use: No  . Drug use: No   Current Medications and Allergies:   .  albuterol (PROVENTIL HFA;VENTOLIN HFA) 108 (90 Base) MCG/ACT inhaler, Inhale 2 puffs every 6 (six) hours as needed into the lungs for wheezing or shortness of breath., Disp: 1 Inhaler, Rfl: 0 .  valACYclovir (VALTREX) 500 MG tablet, Take 1 tablet (500 mg total) by mouth 2 (two) times daily. (Patient taking differently: Take 500 mg by mouth 2 (two) times daily. 1-2 times daily as needed), Disp: 30 tablet, Rfl: 12   Allergies  Allergen Reactions  . Acyclovir And Related Other (See Comments)    Abdominal pain, nausea, and dizziness.  . Sertraline Hcl Other (See Comments)    Paranoia.   [name] [Tinidazole]    Review of Systems   Pertinent items are noted in the HPI. Otherwise, ROS is negative.  Vitals:   Vitals:   08/09/17 1546  BP: 118/60  Pulse: 73  Temp: 98.6 F (37 C)  TempSrc: Oral  SpO2: 99%  Weight: 129 lb 6.4 oz (58.7 kg)     Body mass index is 21.53 kg/m.   Physical Exam:   Physical Exam  Constitutional: She is oriented to person, place, and time. She appears well-developed and well-nourished. No distress.  HENT:  Head: Normocephalic and atraumatic.  Right Ear: External ear normal.  Left Ear: External ear normal.  Nose: Nose normal.  Mouth/Throat: Oropharynx is clear and moist.  Eyes: Conjunctivae and EOM are normal. Pupils are equal, round, and reactive to light.  Neck: Normal range of motion. Neck supple. No thyromegaly present.  Cardiovascular: Normal rate, regular rhythm, normal heart sounds and intact distal pulses.  Pulmonary/Chest: Effort normal and breath sounds normal.  Abdominal: Soft. Bowel sounds are normal.  Musculoskeletal: Normal range of motion.  Spasms at left upper trapezius.  Lymphadenopathy:    She has no cervical adenopathy.  Neurological: She is alert and oriented to person, place, and time.  Skin: Skin is warm and dry. Capillary refill takes less than 2 seconds.  Psychiatric: She has a normal mood and affect. Her behavior is normal.  Nursing note and vitals reviewed.   Assessment and Plan:   Dayshia was seen today for numbness.  Diagnoses and all orders for this visit:  Trapezius muscle spasm Comments: Spasm and soreness left trapezius, arm, low back. Started shortly after cycling 20 miles, which is more than usual. She had a massage, which helped. She has took  a muscle relaxer and NSAID. No history of the same. Exam WNL. Okay treatment prn below. Orders: -     diazepam (VALIUM) 5 MG tablet; Take 1 tablet (5 mg total) by mouth every 12 (twelve) hours as needed for anxiety. -     naproxen (NAPROSYN) 500 MG tablet; Take 1 tablet (500 mg total) by mouth 2 (two) times daily with a meal.  Arm paresthesia, left Comments: No red flags.  . Reviewed expectations re: course of current medical issues. . Discussed self-management of symptoms. . Outlined signs and symptoms  indicating need for more acute intervention. . Patient verbalized understanding and all questions were answered. Marland Kitchen Health Maintenance issues including appropriate healthy diet, exercise, and smoking avoidance were discussed with patient. . See orders for this visit as documented in the electronic medical record. . Patient received an After Visit Summary.  Briscoe Deutscher, DO [place], [place] 08/09/2017  No future appointments.

## 2017-08-10 DIAGNOSIS — N943 Premenstrual tension syndrome: Secondary | ICD-10-CM | POA: Diagnosis not present

## 2017-08-19 DIAGNOSIS — N943 Premenstrual tension syndrome: Secondary | ICD-10-CM | POA: Diagnosis not present

## 2017-08-24 DIAGNOSIS — N943 Premenstrual tension syndrome: Secondary | ICD-10-CM | POA: Diagnosis not present

## 2017-08-25 ENCOUNTER — Encounter: Payer: Self-pay | Admitting: Family Medicine

## 2017-08-30 ENCOUNTER — Ambulatory Visit: Payer: 59 | Admitting: Sports Medicine

## 2017-08-30 ENCOUNTER — Ambulatory Visit: Payer: 59

## 2017-08-30 ENCOUNTER — Encounter: Payer: Self-pay | Admitting: Sports Medicine

## 2017-08-30 VITALS — BP 110/62 | HR 62 | Ht 65.0 in | Wt 135.6 lb

## 2017-08-30 DIAGNOSIS — G43909 Migraine, unspecified, not intractable, without status migrainosus: Secondary | ICD-10-CM | POA: Diagnosis not present

## 2017-08-30 DIAGNOSIS — R208 Other disturbances of skin sensation: Secondary | ICD-10-CM | POA: Diagnosis not present

## 2017-08-30 DIAGNOSIS — G8929 Other chronic pain: Secondary | ICD-10-CM

## 2017-08-30 DIAGNOSIS — G540 Brachial plexus disorders: Secondary | ICD-10-CM

## 2017-08-30 DIAGNOSIS — R202 Paresthesia of skin: Secondary | ICD-10-CM | POA: Diagnosis not present

## 2017-08-30 DIAGNOSIS — M546 Pain in thoracic spine: Secondary | ICD-10-CM | POA: Diagnosis not present

## 2017-08-30 DIAGNOSIS — M62838 Other muscle spasm: Secondary | ICD-10-CM

## 2017-08-30 NOTE — Patient Instructions (Addendum)
   Also check out [employer]" which is a program developed by Dr. Minerva Ends.   There are links to a couple of his YouTube Videos below and I would like to see performing one of his videos 5-6 days per week.    A good intro video is: "Independence from Pain 7-minute Video" - [email or web address]   His more advanced video is: "Powerful Posture and Pain Relief: 12 minutes of Foundation Training" - https://youtu.be/[email or web address]  Do not try to attempt this entire video when first beginning.    Try breaking of each exercise that he goes into shorter segments.  Otherwise if they perform an exercise for 45 seconds, start with 15 seconds and rest and then resume when they begin the new activity.    If you work your way up to doing this 12 minute video, I expect you will see significant improvements in your pain.  If you enjoy his videos and would like to find out more you can look on his website: [email or web address]  He has a workout streaming option as well as a DVD set available for purchase.  Amazon has the best price for his DVDs.     Please perform the exercise program that we have prepared for you and gone over in detail on a daily basis.  In addition to the handout you were provided you can access your program through: [email or web address]   Your unique program code is:  [phone]

## 2017-08-30 NOTE — Procedures (Signed)
PROCEDURE NOTE: THERAPEUTIC EXERCISES (97110) 15 minutes spent for Therapeutic exercises as below and as referenced in the AVS. This included exercises focusing on stretching, strengthening, with significant focus on eccentric aspects.  Proper technique shown and discussed handout in great detail with ATC. All questions were discussed and answered.   Long term goals include an improvement in range of motion, strength, endurance as well as avoiding reinjury. Frequency of visits is one time as determined during today's  office visit. Frequency of exercises to be performed is as per handout.  EXERCISES REVIEWED:  Anterior chain stretching including pectoralis major pectoralis minor  Posterior chain strengthening  Goodman exercises

## 2017-08-30 NOTE — Progress Notes (Signed)
Kristie [name]. Kristie [name], Kristie [name] at [place]  [name] - 37 y.o. female MRN [id number]  Date of birth: 08/07/1980  Visit Date: 08/30/2017  PCP: Briscoe Deutscher, DO   Referred by: Briscoe Deutscher, DO   Scribe for today's visit: Wendy Poet, LAT, ATC     SUBJECTIVE:  Kristie [name] is here for Follow-up (L arm and leg pain and tingling) .   Notes from Initial Visit on 02/16/18: Kristie [name] is a new patient presenting today with complaint of rt upper back/shoulder pain, tension HA, and tingling in the lt foot while bike riding. She would like bike fitting today.     Her L arm and leg and face pain and tingling symptoms INITIALLY: Began in mid-January in her hand w/ a numb sensation that went up her arm.  It then transitioned to her L foot and the symptoms travelled up her L leg.  Most recently, she started to notice the tingling in her L face about a month ago.  She notes that the symptoms started more as tingling but reports increased pain as the symptoms continue. Described as varied in intensity w/ an 8/10 at it's worst, radiating to L arm, L leg and L face. Worsened with nothing stated. Improved with nothing stated Additional associated symptoms include: denies change in her symptoms w/ coughing/sneezing    At this time symptoms are worsening compared to onset w/ increased frequency and increased location of symptoms (L UE, LE and face) She has been using a home cervical traction unit which has helped w/ her UT and neck tension but is not helping the symptoms in her L arm. She was prescribed Naproxen and Valium but states that these did not change her symptoms.   ROS Reports night time disturbances. Reports fevers, chills, or night sweats.  Yes to night sweats. Denies unexplained weight loss. Denies personal history of cancer. Denies changes in bowel or bladder habits. Denies recent unreported falls. Reports new or  worsening dyspnea or wheezing.  Has EIA. Denies headaches or dizziness.  Reports numbness, tingling or weakness  In the extremities.  Denies dizziness or presyncopal episodes Denies lower extremity edema    HISTORY & PERTINENT PRIOR DATA:  Prior History reviewed and updated per electronic medical record.  Significant history, findings, studies and interim changes include:  reports that she has quit smoking. She quit smokeless tobacco use about 13 years ago. Recent Labs    04/11/17  HGBA1C 5.0   No specialty comments available. Problem  Thoracic Outlet Syndrome  Dysesthesia of Multiple Sites  Thoracic Back Pain  Migraines   Long-standing history of migraines including prior workup with MRI that was negative for any type of significant lesion.     OBJECTIVE:  VS:  HT:5\' 5"  (165.1 cm)   WT:135 lb 9.6 oz (61.5 kg)  BMI:22.57    BP:110/62  HR:62bpm  TEMP: ( )  RESP:98 %   PHYSICAL EXAM: Constitutional: WDWN, Non-toxic appearing. Psychiatric: Alert & appropriately interactive.  Not depressed or anxious appearing. Respiratory: No increased work of breathing.  Trachea Midline Eyes: Pupils are equal.  EOM intact without nystagmus.  No scleral icterus  NEUROVASCULAR exam: No clubbing or cyanosis appreciated No significant venous stasis changes Capillary Refill: normal, less than 2 seconds   Bilateral upper extremities overall well aligned she does have full overhead range of motion of the shoulders.  Upper extremity strength is 5+/5.  She is hyperreflexic in upper  extremity myotomes with a negative Hoffmann's.  Slight cervical sidebending and rotation deficiency to the left.  She has markedly tight omohyoid.  Intrinsic rotator cuff strength is intact.  She has complete occlusion of her left radial pulse with Roos testing, normal on the right.   ASSESSMENT & PLAN:   1. Trapezius muscle spasm   2. Arm paresthesia, left   3. Left leg paresthesias   4. Thoracic outlet syndrome    5. Chronic bilateral thoracic back pain   6. Migraine without status migrainosus, not intractable, unspecified migraine type   7. Dysesthesia of multiple sites     Orders & Meds:  Orders Placed This Encounter  Procedures  . XR Cervical Spine 2 or 3 views  . XR Lumbar Spine 2-3 Views   No orders of the defined types were placed in this encounter.    PLAN:>50% of this 50 minute visit spent in direct patient counseling and/or coordination of care.  Discussion was focused on education regarding the in discussing the pathoetiology and anticipated clinical course of the above condition.  Thoracic outlet syndrome Patient undoubtedly has thoracic outlet syndrome with complete absence of her left radial pulse with reduced testing.  Discussed the foundation of treatment for this condition is physical therapy and/or daily (5-6 days/week) therapeutic exercises.  No evidence of a cervical rib on x-rays.  She does have a markedly long neck and that may be contributing to tight anterior chain including pectoralis minor and omohyoid.  If any lack of improvement can consider further diagnostic evaluation of the thoracic outlet.  Thoracic back pain Tight anterior chain including the hip flexors which is likely contributing to her thoracic back pain.  Continue with therapeutic exercises per AVS.  Dysesthesia of multiple sites Mainly left-sided symptoms involving the left arm and leg.  Left arm is most significant with only intermittent numbness and tingling of the left lower leg. It is difficult to say what this is coming from but especially the left upper extremity symptoms may very well be related to the thoracic outlet syndrome.  We will plan to focus on this unless there is any significant worsening or focal neurologic symptoms.  If any lack of improvement MRI of the brain and spinal cord will be indicated with and without contrast she has been taking multiple medications as well including cholestyramine  that may be sequestering multiple vitamins and minerals that could be worsening S.  If any lack of improvement I would also recommend further diagnostic evaluation with basic lab work including B vitamins and vitamin D.  fat soluble vitamins would also be at risk given the cholestyramine   Follow-up: Return in about 4 weeks (around 09/27/2017).   Pertinent documentation may be included in additional procedure notes, imaging studies, problem based documentation and patient instructions. Please see these sections of the encounter for additional information regarding this visit. CMA/ATC served as Education administrator during this visit. History, Physical, and Plan performed by medical provider. Documentation and orders reviewed and attested to.      Gerda Diss, [place] [facility] Physician

## 2017-08-30 NOTE — Procedures (Signed)
X-Rays obtained at [facility] at [facility] Interpreted by myself Gerda Diss, DO) during office visit.  Results were reviewed with the patient at the time of the visit.   2V Lumbar spine  FINDINGS:   5 lumbar vertebrae  Mild lumbar facet disease.  No significant scoliosis however she does have segmental changes most notably L1 rotation with slight abnormality within the left transverse process of L1.    She has a diminutive and hypoplastic left 12th rib.  Otherwise unremarkable osseous structures  No acute fracture or dislocation IMPRESSION:  1. Unremarkable lumbar spine films

## 2017-08-30 NOTE — Procedures (Signed)
X-Rays obtained at [facility] at [facility] Interpreted by myself Gerda Diss, DO) during office visit.  Results were reviewed with the patient at the time of the visit.   2V Cervical Spine  FINDINGS:   Straightening of the cervical lordosis with moderate loss of disc space between C5 and C6.    There are 7 cervical vertebrae with a prominent first rib and low-lying clavicles but no evidence of a true cervical rib.  IMPRESSION:  1. Mild degenerative disc disease most notably at C5-6 2. Mild cervical spondylosis at the same level 3. No evidence of a true cervical rib

## 2017-08-31 DIAGNOSIS — N943 Premenstrual tension syndrome: Secondary | ICD-10-CM | POA: Diagnosis not present

## 2017-08-31 NOTE — Telephone Encounter (Signed)
Called patient she was seen by him yesterday. Called on Monday and was given app by [facility]

## 2017-09-07 DIAGNOSIS — R208 Other disturbances of skin sensation: Secondary | ICD-10-CM | POA: Insufficient documentation

## 2017-09-07 DIAGNOSIS — G540 Brachial plexus disorders: Secondary | ICD-10-CM | POA: Insufficient documentation

## 2017-09-07 DIAGNOSIS — N943 Premenstrual tension syndrome: Secondary | ICD-10-CM | POA: Diagnosis not present

## 2017-09-07 NOTE — Assessment & Plan Note (Signed)
Patient undoubtedly has thoracic outlet syndrome with complete absence of her left radial pulse with reduced testing.  Discussed the foundation of treatment for this condition is physical therapy and/or daily (5-6 days/week) therapeutic exercises.  No evidence of a cervical rib on x-rays.  She does have a markedly long neck and that may be contributing to tight anterior chain including pectoralis minor and omohyoid.  If any lack of improvement can consider further diagnostic evaluation of the thoracic outlet.

## 2017-09-07 NOTE — Assessment & Plan Note (Signed)
Mainly left-sided symptoms involving the left arm and leg.  Left arm is most significant with only intermittent numbness and tingling of the left lower leg. It is difficult to say what this is coming from but especially the left upper extremity symptoms may very well be related to the thoracic outlet syndrome.  We will plan to focus on this unless there is any significant worsening or focal neurologic symptoms.  If any lack of improvement MRI of the brain and spinal cord will be indicated with and without contrast she has been taking multiple medications as well including cholestyramine that may be sequestering multiple vitamins and minerals that could be worsening S.  If any lack of improvement I would also recommend further diagnostic evaluation with basic lab work including B vitamins and vitamin D.  fat soluble vitamins would also be at risk given the cholestyramine

## 2017-09-07 NOTE — Assessment & Plan Note (Signed)
Tight anterior chain including the hip flexors which is likely contributing to her thoracic back pain.  Continue with therapeutic exercises per AVS.

## 2017-09-14 DIAGNOSIS — N943 Premenstrual tension syndrome: Secondary | ICD-10-CM | POA: Diagnosis not present

## 2017-09-18 NOTE — Progress Notes (Addendum)
Subjective:    Kristie [name] is a 37 y.o. female and is here for a comprehensive physical exam.  PMHx, SurgHx, SocialHx, Medications, and Allergies were reviewed in the Visit Navigator and updated as appropriate.   Past Medical History:  Diagnosis Date  . Anxiety   . Asthma, exercise induced   . Dysplasia of cervix, high grade CIN 2   . HPV in female   . Migraines   . Seasonal allergies     Past Surgical History:  Procedure Laterality Date  . LASER ABLATION OF THE CERVIX  2013   CIN-2  . VAGINAL CYST EXCISED  2001    Family History  Problem Relation Age of Onset  . Hypertension Mother   . Breast cancer Maternal Aunt   . Prostate cancer Maternal Uncle   . Hypertension Maternal Grandmother   . Heart disease Maternal Grandmother   . Lung cancer Maternal Grandfather    Social History   Tobacco Use  . Smoking status: Former Research scientist (life sciences)  . Smokeless tobacco: Former Systems developer    Quit date: 09/17/2003  Substance Use Topics  . Alcohol use: No  . Drug use: No   Review of Systems:   Pertinent items are noted in the HPI. Otherwise, ROS is negative.  Objective:   BP 110/64   Pulse 62   Temp 98.6 F (37 C) (Oral)   Ht 5\' 5"  (1.651 m)   Wt 132 lb 6.4 oz (60.1 kg)   LMP 08/29/2017   SpO2 97%   BMI 22.03 kg/m    Wt Readings from Last 3 Encounters:  09/19/17 132 lb 6.4 oz (60.1 kg)  08/30/17 135 lb 9.6 oz (61.5 kg)  08/09/17 129 lb 6.4 oz (58.7 kg)     Ht Readings from Last 3 Encounters:  09/19/17 5\' 5"  (1.651 m)  08/30/17 5\' 5"  (1.651 m)  02/17/17 5\' 5"  (1.651 m)   General appearance: alert, cooperative and appears stated age. Head: normocephalic, without obvious abnormality, atraumatic. Neck: no adenopathy, supple, symmetrical, trachea midline; thyroid not enlarged, symmetric, no tenderness/mass/nodules. Lungs: clear to auscultation bilaterally. Heart: regular rate and rhythm Abdomen: soft, non-tender; no masses,  no organomegaly. Extremities: extremities normal,  atraumatic, no cyanosis or edema. Skin: skin color, texture, turgor normal, no rashes or lesions. Lymph: cervical, supraclavicular, and axillary nodes normal; no abnormal inguinal nodes palpated. Neurologic: grossly normal.  Assessment/Plan:   Kristie [name] was seen today for annual exam.  Diagnoses and all orders for this visit:  Routine physical examination  Paresthesia -     CBC with Differential/Platelet -     Comprehensive metabolic panel -     TSH -     Magnesium -     CK -     Sedimentation rate -     C-reactive protein -     Vitamin B12  Myalgia -     CBC with Differential/Platelet -     Comprehensive metabolic panel -     TSH -     Magnesium -     CK -     Sedimentation rate -     C-reactive protein  Vitamin B 12 deficiency -     Vitamin B12  Vitamin D deficiency -     VITAMIN D 25 Hydroxy (Vit-D Deficiency, Fractures)   Patient Counseling:   [x]     Nutrition: Stressed importance of moderation in sodium/caffeine intake, saturated fat and cholesterol, caloric balance, sufficient intake of fresh fruits, vegetables, fiber, calcium, iron, and 1  mg of folate supplement per day (for females capable of pregnancy).   [x]      Stressed the importance of regular exercise.    [x]     Substance Abuse: Discussed cessation/primary prevention of tobacco, alcohol, or other drug use; driving or other dangerous activities under the influence; availability of treatment for abuse.    [x]      Injury prevention: Discussed safety belts, safety helmets, smoke detector, smoking near bedding or upholstery.    [x]      Sexuality: Discussed sexually transmitted diseases, partner selection, use of condoms, avoidance of unintended pregnancy  and contraceptive alternatives.    [x]     Dental health: Discussed importance of regular tooth brushing, flossing, and dental visits.   [x]      Health maintenance and immunizations reviewed. Please refer to Health maintenance section.   Briscoe Deutscher, DO [place]

## 2017-09-19 ENCOUNTER — Ambulatory Visit (INDEPENDENT_AMBULATORY_CARE_PROVIDER_SITE_OTHER): Payer: 59 | Admitting: Family Medicine

## 2017-09-19 ENCOUNTER — Encounter: Payer: Self-pay | Admitting: Family Medicine

## 2017-09-19 VITALS — BP 110/64 | HR 62 | Temp 98.6°F | Ht 65.0 in | Wt 132.4 lb

## 2017-09-19 DIAGNOSIS — Z0000 Encounter for general adult medical examination without abnormal findings: Secondary | ICD-10-CM | POA: Diagnosis not present

## 2017-09-19 DIAGNOSIS — M791 Myalgia, unspecified site: Secondary | ICD-10-CM | POA: Diagnosis not present

## 2017-09-19 DIAGNOSIS — R202 Paresthesia of skin: Secondary | ICD-10-CM | POA: Diagnosis not present

## 2017-09-19 DIAGNOSIS — E559 Vitamin D deficiency, unspecified: Secondary | ICD-10-CM | POA: Diagnosis not present

## 2017-09-19 DIAGNOSIS — E538 Deficiency of other specified B group vitamins: Secondary | ICD-10-CM

## 2017-09-19 LAB — CBC WITH DIFFERENTIAL/PLATELET
Basophils Absolute: 0.1 10*3/uL (ref 0.0–0.1)
Basophils Relative: 1.8 % (ref 0.0–3.0)
Eosinophils Absolute: 0.2 10*3/uL (ref 0.0–0.7)
Eosinophils Relative: 4.9 % (ref 0.0–5.0)
HCT: 40.5 % (ref 36.0–46.0)
Hemoglobin: 13.6 g/dL (ref 12.0–15.0)
Lymphocytes Relative: 33.5 % (ref 12.0–46.0)
Lymphs Abs: 1.2 10*3/uL (ref 0.7–4.0)
MCHC: 33.6 g/dL (ref 30.0–36.0)
MCV: 98.3 fl (ref 78.0–100.0)
Monocytes Absolute: 0.3 10*3/uL (ref 0.1–1.0)
Monocytes Relative: 7.2 % (ref 3.0–12.0)
Neutro Abs: 1.9 10*3/uL (ref 1.4–7.7)
Neutrophils Relative %: 52.6 % (ref 43.0–77.0)
Platelets: 160 10*3/uL (ref 150.0–400.0)
RBC: 4.12 Mil/uL (ref 3.87–5.11)
RDW: 12.9 % (ref 11.5–15.5)
WBC: 3.6 10*3/uL — ABNORMAL LOW (ref 4.0–10.5)

## 2017-09-19 LAB — SEDIMENTATION RATE: Sed Rate: 1 mm/hr (ref 0–20)

## 2017-09-19 LAB — COMPREHENSIVE METABOLIC PANEL
ALT: 12 U/L (ref 0–35)
AST: 13 U/L (ref 0–37)
Albumin: 4.2 g/dL (ref 3.5–5.2)
Alkaline Phosphatase: 36 U/L — ABNORMAL LOW (ref 39–117)
BUN: 10 mg/dL (ref 6–23)
CO2: 27 mEq/L (ref 19–32)
Calcium: 9.2 mg/dL (ref 8.4–10.5)
Chloride: 105 mEq/L (ref 96–112)
Creatinine, Ser: 0.69 mg/dL (ref 0.40–1.20)
GFR: 100.83 mL/min (ref >60.00)
Glucose, Bld: 92 mg/dL (ref 70–99)
Potassium: 4.1 mEq/L (ref 3.5–5.1)
Sodium: 138 mEq/L (ref 135–145)
Total Bilirubin: 1 mg/dL (ref 0.2–1.2)
Total Protein: 6.9 g/dL (ref 6.0–8.3)

## 2017-09-19 LAB — TSH: TSH: 2.77 u[IU]/mL (ref 0.35–4.50)

## 2017-09-19 LAB — C-REACTIVE PROTEIN: CRP: <0.1 mg/dL — ABNORMAL LOW (ref 0.5–20.0)

## 2017-09-19 LAB — VITAMIN B12: Vitamin B-12: >1500 pg/mL — ABNORMAL HIGH (ref 211–911)

## 2017-09-19 LAB — CK: Total CK: 57 U/L (ref 7–177)

## 2017-09-19 LAB — VITAMIN D 25 HYDROXY (VIT D DEFICIENCY, FRACTURES): VITD: 28.7 ng/mL — ABNORMAL LOW (ref 30.00–100.00)

## 2017-09-19 LAB — MAGNESIUM: Magnesium: 1.7 mg/dL (ref 1.5–2.5)

## 2017-09-21 DIAGNOSIS — R51 Headache: Secondary | ICD-10-CM | POA: Diagnosis not present

## 2017-09-21 DIAGNOSIS — R5383 Other fatigue: Secondary | ICD-10-CM | POA: Diagnosis not present

## 2017-09-21 DIAGNOSIS — N943 Premenstrual tension syndrome: Secondary | ICD-10-CM | POA: Diagnosis not present

## 2017-09-21 DIAGNOSIS — K929 Disease of digestive system, unspecified: Secondary | ICD-10-CM | POA: Diagnosis not present

## 2017-09-25 ENCOUNTER — Encounter: Payer: Self-pay | Admitting: Family Medicine

## 2017-09-26 ENCOUNTER — Ambulatory Visit: Payer: 59 | Admitting: Sports Medicine

## 2017-09-28 ENCOUNTER — Encounter: Payer: Self-pay | Admitting: Family Medicine

## 2017-09-28 ENCOUNTER — Encounter: Payer: Self-pay | Admitting: Sports Medicine

## 2017-09-29 NOTE — Telephone Encounter (Signed)
Please advise 

## 2017-10-04 ENCOUNTER — Other Ambulatory Visit: Payer: Self-pay

## 2017-10-04 DIAGNOSIS — Z77120 Contact with and (suspected) exposure to mold (toxic): Secondary | ICD-10-CM

## 2017-10-18 ENCOUNTER — Ambulatory Visit: Payer: 59 | Admitting: Sports Medicine

## 2017-10-18 DIAGNOSIS — N943 Premenstrual tension syndrome: Secondary | ICD-10-CM | POA: Diagnosis not present

## 2017-10-27 DIAGNOSIS — E039 Hypothyroidism, unspecified: Secondary | ICD-10-CM | POA: Diagnosis not present

## 2017-10-27 DIAGNOSIS — R51 Headache: Secondary | ICD-10-CM | POA: Diagnosis not present

## 2017-10-27 DIAGNOSIS — R5383 Other fatigue: Secondary | ICD-10-CM | POA: Diagnosis not present

## 2017-10-27 DIAGNOSIS — K929 Disease of digestive system, unspecified: Secondary | ICD-10-CM | POA: Diagnosis not present

## 2017-10-27 DIAGNOSIS — Z77120 Contact with and (suspected) exposure to mold (toxic): Secondary | ICD-10-CM | POA: Diagnosis not present

## 2017-10-27 DIAGNOSIS — N943 Premenstrual tension syndrome: Secondary | ICD-10-CM | POA: Diagnosis not present

## 2017-10-27 DIAGNOSIS — R52 Pain, unspecified: Secondary | ICD-10-CM | POA: Diagnosis not present

## 2017-11-04 ENCOUNTER — Ambulatory Visit: Payer: 59 | Admitting: Sports Medicine

## 2017-11-07 ENCOUNTER — Ambulatory Visit: Payer: 59 | Admitting: Women's Health

## 2017-11-07 VITALS — BP 104/68 | Ht 66.0 in | Wt 132.8 lb

## 2017-11-07 DIAGNOSIS — B009 Herpesviral infection, unspecified: Secondary | ICD-10-CM

## 2017-11-07 DIAGNOSIS — Z113 Encounter for screening for infections with a predominantly sexual mode of transmission: Secondary | ICD-10-CM | POA: Diagnosis not present

## 2017-11-07 MED ORDER — VALACYCLOVIR HCL 500 MG PO TABS: 500.0000 mg | ORAL_TABLET | Freq: Two times a day (BID) | ORAL | 12 refills | Status: DC

## 2017-11-07 MED FILL — VALACYCLOVIR HCL 500 MG TAB: 500 | 90 days supply | Qty: 180 | Fill #3

## 2017-11-07 NOTE — Progress Notes (Signed)
37 year old SWF G0 presents for STD screen.  In a new relationship but has not been sexually active.  Denies any symptoms of abdominal pain, vaginal discharge, pelvic pain, urinary symptoms or fever.  Monthly cycle/condoms.  HSV 1 history with pelvic pain takes Valtrex daily with good relief of pelvic pain syndrome.  Currently being treated for systemic mold at [facility] LEEP procedure twice 2013, 2018 margins negative has follow-up scheduled in July.  Exam: Appears well.  Abdomen soft nontender, external genitalia within normal limits without visible lesion, discharge, speculum exam no visible discharge, irritation or erythema, GC/chlamydia culture taken.  Bimanual no CMT or adnexal tenderness.  STD screen  Plan: GC/Chlamydia culture pending.  HIV, hep B, C, RPR.  Continue condoms, declines other contraception.

## 2017-11-08 ENCOUNTER — Encounter: Payer: Self-pay | Admitting: Sports Medicine

## 2017-11-08 ENCOUNTER — Ambulatory Visit: Payer: 59 | Admitting: Sports Medicine

## 2017-11-08 VITALS — BP 102/74 | HR 62 | Ht 66.0 in | Wt 133.8 lb

## 2017-11-08 DIAGNOSIS — R202 Paresthesia of skin: Secondary | ICD-10-CM

## 2017-11-08 DIAGNOSIS — R200 Anesthesia of skin: Secondary | ICD-10-CM

## 2017-11-08 LAB — HEPATITIS B SURFACE ANTIGEN: Hepatitis B Surface Ag: NONREACTIVE

## 2017-11-08 LAB — C. TRACHOMATIS/N. GONORRHOEAE RNA
C. trachomatis RNA, TMA: NOT DETECTED
N. gonorrhoeae RNA, TMA: NOT DETECTED

## 2017-11-08 LAB — RPR: RPR Ser Ql: NONREACTIVE

## 2017-11-08 LAB — HEPATITIS C ANTIBODY
Hepatitis C Ab: NONREACTIVE
SIGNAL TO CUT-OFF: 0.01 (ref <1.00)

## 2017-11-08 LAB — HIV ANTIBODY (ROUTINE TESTING W REFLEX): HIV 1&2 Ab, 4th Generation: NONREACTIVE

## 2017-11-08 NOTE — Progress Notes (Signed)
Kristie [name]. Kristie [name], [place] at [place]  Kristie [name] - 37 y.o. female MRN [id number]  Date of birth: 16-Jun-1981  Visit Date: 11/08/2017  PCP: Kristie Deutscher, DO   Referred by: Kristie Deutscher, DO  Scribe for today's visit: Kristie [name], LAT, ATC     SUBJECTIVE:  Kristie [name] is here for Follow-up (L UE Kristie LE [name] Kristie bike fit) .   Notes from Initial Visit on 02/16/18: Kristie [name], Kristie [name], Kristie [name] lt foot while bike riding. She would like bike fitting today.     Her L arm Kristie leg Kristie face [name] Kristie tingling symptoms INITIALLY: Began in mid-January in her hand w/ a numb sensation that went up her arm.  It then transitioned to her L foot Kristie [name] symptoms travelled up her L leg.  Most recently, she started to notice [name] tingling in her L face about a month ago.  She notes that [name] symptoms started more as tingling but reports increased [name] as [name] symptoms continue. Described as varied in intensity w/ an 8/10 at it's worst, radiating to L arm, L leg Kristie L face. Worsened with nothing stated. Improved with nothing stated Additional associated symptoms include: denies change in her symptoms w/ coughing/sneezing    At this time symptoms are worsening compared to onset w/ increased frequency Kristie increased location of symptoms (L UE, LE Kristie face) She has been using a home cervical traction unit which has helped w/ her UT Kristie neck Kristie but is not helping [name] symptoms in her L arm. She was prescribed Naproxen Kristie Valium but states that these did not change her symptoms.  11/08/17: Compared to [name] last office visit on 08/30/17, her previously described L UE Kristie LE symptoms show no change.  She states that her symptoms continue to wax Kristie wane.  She notes that this morning, she didn't have many symptoms in her L UE but now she has [name] Kristie N/T  in her L hand Kristie L foot.  She reports new symptoms in her R UE (tingling).  She states that she has decreased her frequency of biking Kristie doing yoga.  She states that her prior neck Kristie lower back [name] have improved.  She also reports decreased issues w/ TOS. She has been doing her HEP Kristie [name] [employer].  ROS Denies night time disturbances. Denies fevers, chills, or night sweats. Denies unexplained weight loss. Denies personal history of cancer. Denies changes in bowel or bladder habits. Denies recent unreported falls. Reports new or worsening dyspnea or wheezing. Has EIA. Denies headaches or dizziness.  Reports numbness, tingling or weakness  In [name] extremities.  Denies dizziness or presyncopal episodes Denies lower extremity edema    HISTORY & PERTINENT PRIOR DATA:  Prior History reviewed Kristie updated per electronic medical record.  Significant/pertinent history, findings, studies include:  reports that she has quit smoking. She quit smokeless tobacco use about 14 years ago. Recent Labs    04/11/17  HGBA1C 5.0   No specialty comments available. No problems updated.  OBJECTIVE:  VS:  HT:5\' 6"  (167.6 cm)   WT:133 lb 12.8 oz (60.7 kg)  BMI:21.61    BP:102/74  HR:62bpm  TEMP: ( )  RESP:98 %   PHYSICAL EXAM: Constitutional: WDWN, Non-toxic appearing. Psychiatric: Alert & appropriately interactive.  Not depressed or anxious appearing. Respiratory: No increased  work of breathing.  Trachea Midline Eyes: Pupils are equal.  EOM intact without nystagmus.  No scleral icterus  Vascular Exam: warm to touch no edema  upper Kristie lower extremity neuro exam: Slightly hyperreflexic in [name] left upper extremity compared to right minimally.  Strength is 5 out of 5.  No significant dermatomal distribution dysesthesia.  MSK Exam: Dynamic bike fit analysis performed today did show slightly wide stance position of [name] left pedal that was causing a dynamic knee shift that was improved  with repositioning of [name] pedal to a narrower Q angle.   ASSESSMENT & PLAN:   1. Numbness Kristie tingling     PLAN: Given [name] persistent ongoing numbness Kristie [name] bilateral upper Kristie lower extremities as well as [name] previous history of nonspecific neurologic changes Kristie persistent ongoing symptoms in [name] face of treatment for detoxifying from mold through Kristie [name] integrative therapy there are concerns for potential underlying demyelinating disease such as MS.  Prior brain MRI was reassuring however this was several years ago Kristie there has been a progression of her symptoms since that time.  If no focal neurologic symptoms that are suggesting a single level cervical or lumbar disc disease Kristie evaluation of [name] brain Kristie spinal cord is indicated with MRI.  Follow-up after these results are obtained Kristie I will plan to call her soon as they are available.  Follow-up: Return for MRI review.   >50% of this 45 minute visit spent in direct patient counseling Kristie/or coordination of care.  Discussion was focused on education regarding [name] in discussing [name] pathoetiology Kristie anticipated clinical course of [name] above condition.    Please see additional documentation for Objective, Assessment Kristie Plan sections. Pertinent additional documentation may be included in corresponding procedure notes, imaging studies, problem based documentation Kristie patient instructions. Please see these sections of [name] encounter for additional information regarding this visit.  CMA/ATC served as Education administrator during this visit. History, Physical, Kristie Plan performed by medical provider. Documentation Kristie orders reviewed Kristie attested to.      Kristie [name], [facility] Physician

## 2017-11-08 NOTE — Patient Instructions (Signed)

## 2017-11-13 ENCOUNTER — Ambulatory Visit
Admission: RE | Admit: 2017-11-13 | Discharge: 2017-11-13 | Disposition: A | Discharge status: Home / Self Care | Payer: 59 | Source: Ambulatory Visit | Attending: Sports Medicine | Admitting: Sports Medicine

## 2017-11-13 ENCOUNTER — Encounter: Payer: Self-pay | Admitting: Sports Medicine

## 2017-11-13 DIAGNOSIS — R202 Paresthesia of skin: Secondary | ICD-10-CM | POA: Diagnosis not present

## 2017-11-13 DIAGNOSIS — R200 Anesthesia of skin: Secondary | ICD-10-CM

## 2017-11-13 DIAGNOSIS — M4804 Spinal stenosis, thoracic region: Secondary | ICD-10-CM | POA: Diagnosis not present

## 2017-11-13 DIAGNOSIS — M50222 Other cervical disc displacement at C5-C6 level: Secondary | ICD-10-CM | POA: Diagnosis not present

## 2017-11-13 DIAGNOSIS — M48061 Spinal stenosis, lumbar region without neurogenic claudication: Secondary | ICD-10-CM | POA: Diagnosis not present

## 2017-11-13 DIAGNOSIS — M50223 Other cervical disc displacement at C6-C7 level: Secondary | ICD-10-CM | POA: Diagnosis not present

## 2017-11-13 DIAGNOSIS — M4802 Spinal stenosis, cervical region: Secondary | ICD-10-CM | POA: Diagnosis not present

## 2017-11-13 DIAGNOSIS — M5124 Other intervertebral disc displacement, thoracic region: Secondary | ICD-10-CM | POA: Diagnosis not present

## 2017-11-13 MED ORDER — GADOBENATE DIMEGLUMINE 529 MG/ML IV SOLN
12.0000 mL | Freq: Once | INTRAVENOUS | Status: AC | PRN
  Administered 2017-11-13: 12 mL via INTRAVENOUS

## 2017-11-14 ENCOUNTER — Encounter: Payer: Self-pay | Admitting: Sports Medicine

## 2017-11-14 NOTE — Progress Notes (Signed)
I have called and updated her on the results.  No evidence of demyelinating disease/MS.  She does have a very large herniation that is compressing spinal cord remained up requiring a surgical decompression but we will have her come back to review these results in detail and discuss further management options including potential for ESI.  She reports having 0 symptoms over the weekend except for following riding her bike on Sunday.

## 2017-11-14 NOTE — Progress Notes (Signed)
Please call to schedule a follow-up appointment with her at her convenience.

## 2017-11-15 ENCOUNTER — Encounter: Payer: Self-pay | Admitting: Sports Medicine

## 2017-11-18 ENCOUNTER — Other Ambulatory Visit: Payer: 59

## 2017-11-18 ENCOUNTER — Ambulatory Visit: Payer: 59 | Admitting: Sports Medicine

## 2017-11-29 ENCOUNTER — Ambulatory Visit: Payer: 59 | Admitting: Sports Medicine

## 2017-11-29 ENCOUNTER — Encounter: Payer: Self-pay | Admitting: Sports Medicine

## 2017-11-29 VITALS — BP 104/66 | HR 59 | Ht 66.0 in | Wt 135.0 lb

## 2017-11-29 DIAGNOSIS — G540 Brachial plexus disorders: Secondary | ICD-10-CM | POA: Diagnosis not present

## 2017-11-29 DIAGNOSIS — R202 Paresthesia of skin: Secondary | ICD-10-CM | POA: Diagnosis not present

## 2017-11-29 DIAGNOSIS — R200 Anesthesia of skin: Secondary | ICD-10-CM

## 2017-11-29 NOTE — Progress Notes (Signed)
Kristie [name]. [place], [place] at [place]  ABEER DESKINS - 37 y.o. female MRN [id number]  Date of birth: 13-Jun-1981  Visit Date: 11/29/2017  PCP: Briscoe Deutscher, DO   Referred by: Briscoe Deutscher, DO  Scribe for today's visit: Wendy Poet, LAT, ATC     SUBJECTIVE:  Kristie [name] is here for Follow-up (UE and LE pain) .   Notes from Initial Visit on 02/16/18: Kristie [name] is a new patient presenting today with complaint of rt upper back/shoulder pain, tension HA, and tingling in the lt foot while bike riding. She would like bike fitting today.     Her L arm and leg and face pain and tingling symptoms INITIALLY: Began in mid-January in her hand w/ a numb sensation that went up her arm.  It then transitioned to her L foot and the symptoms travelled up her L leg.  Most recently, she started to notice the tingling in her L face about a month ago.  She notes that the symptoms started more as tingling but reports increased pain as the symptoms continue. Described as varied in intensity w/ an 8/10 at it's worst, radiating to L arm, L leg and L face. Worsened with nothing stated. Improved with nothing stated Additional associated symptoms include: denies change in her symptoms w/ coughing/sneezing    At this time symptoms are worsening compared to onset w/ increased frequency and increased location of symptoms (L UE, LE and face) She has been using a home cervical traction unit which has helped w/ her UT and neck tension but is not helping the symptoms in her L arm. She was prescribed Naproxen and Valium but states that these did not change her symptoms.  11/08/17: Compared to the last office visit on 08/30/17, her previously described L UE and LE symptoms show no change.  She states that her symptoms continue to wax and wane.  She notes that this morning, she didn't have many symptoms in her L UE but now she has pain and N/T in her L hand  and L foot.  She reports new symptoms in her R UE (tingling).  She states that she has decreased her frequency of biking and doing yoga.  She states that her prior neck and lower back pain have improved.  She also reports decreased issues w/ TOS. She has been doing her HEP and the [employer].  11/29/2017: Compared to the last office visit on 11/08/17, her previously described L UE and LE symptoms are improving .  She states that her symptoms con't to wax and wane.  She notes that she has no symptoms in her L UE today but is having some issues along the R post upper arm and R neck.  She reports having no issues over the weekend.  She states that she hasn't been having as many issues w/ the tingling in her arms or legs. Current symptoms are moderate & are radiating to R UE. She has not been doing her HEP or doing the [employer] but has continued to use the neck hammock.  ROS Denies night time disturbances. Denies fevers, chills, or night sweats. Denies unexplained weight loss. Denies personal history of cancer. Denies changes in bowel or bladder habits. Denies recent unreported falls. Reports new or worsening dyspnea or wheezing. Denies headaches or dizziness.  Denies numbness, tingling or weakness  In the extremities.  Denies dizziness or presyncopal episodes Denies lower extremity edema  HISTORY & PERTINENT PRIOR DATA:  Prior History reviewed and updated per electronic medical record.  Significant/pertinent history, findings, studies include:  reports that she has quit smoking. She quit smokeless tobacco use about 14 years ago. Recent Labs    04/11/17  HGBA1C 5.0   No specialty comments available. No problems updated.  OBJECTIVE:  VS:  HT:5\' 6"  (167.6 cm)   WT:135 lb (61.2 kg)  BMI:21.8    BP:104/66  HR:(Abnormal) 59bpm  TEMP: ( )  RESP:99 %   PHYSICAL EXAM: Constitutional: WDWN, Non-toxic appearing. Psychiatric: Alert & appropriately interactive.  Not depressed  or anxious appearing. Respiratory: No increased work of breathing.  Trachea Midline Eyes: Pupils are equal.  EOM intact without nystagmus.  No scleral icterus  Vascular Exam: warm to touch no edema  upper and lower extremity neuro exam: unremarkable normal strength normal sensation Overall improved.  Negative Hoffmann's bilaterally.  She has no pain with brachial plexus squeeze or arm squeeze test today.  MSK Exam: Overall she is improved from the standpoint of her neck pain and shoulder pain today.  She has improvement in her distal upper externally pulses with overall well-maintained radial pulse with Lorin [facility] testing.  Upper extremity strength and lower semi-strength is symmetric and she is able to heel toe walk without difficulty.   ASSESSMENT & PLAN:   1. Numbness and tingling   2. Paresthesia   3. Arm paresthesia, left   4. Thoracic outlet syndrome   5. Left leg paresthesias     PLAN: MRIs are reassuring and there is no evidence of demyelinating disease.  She does have a fairly significant disc herniation although no evidence of myelopathy on MRI or exam today.  We did discuss that there is a significant increased risk of significant spinal cord injury and neurosurgical evaluation is recommended.  She will plan to follow-up with me only as needed and am happy to see her at any time especially regarding the thoracic outlet syndrome that she does have but has improved with home exercises.  If any worsening symptoms and always happy to see her but will defer to neurosurgical and neurology recommendations at this time.  Recommending avoiding aggressive manipulation to the cervical spine  Follow-up: No follow-ups on file.      Please see additional documentation for Objective, Assessment and Plan sections. Pertinent additional documentation may be included in corresponding procedure notes, imaging studies, problem based documentation and patient instructions. Please see these sections of  the encounter for additional information regarding this visit.  CMA/ATC served as Education administrator during this visit. History, Physical, and Plan performed by medical provider. Documentation and orders reviewed and attested to.      Gerda Diss, [facility] Physician

## 2017-12-17 ENCOUNTER — Encounter: Payer: Self-pay | Admitting: Sports Medicine

## 2017-12-28 ENCOUNTER — Encounter: Payer: Self-pay | Admitting: Women's Health

## 2017-12-28 ENCOUNTER — Ambulatory Visit (INDEPENDENT_AMBULATORY_CARE_PROVIDER_SITE_OTHER): Payer: 59 | Admitting: Women's Health

## 2017-12-28 VITALS — BP 110/72 | Ht 67.0 in | Wt 131.6 lb

## 2017-12-28 DIAGNOSIS — Z01419 Encounter for gynecological examination (general) (routine) without abnormal findings: Secondary | ICD-10-CM

## 2017-12-28 DIAGNOSIS — B009 Herpesviral infection, unspecified: Secondary | ICD-10-CM

## 2017-12-28 DIAGNOSIS — R87610 Atypical squamous cells of undetermined significance on cytologic smear of cervix (ASC-US): Secondary | ICD-10-CM | POA: Diagnosis not present

## 2017-12-28 MED ORDER — VALACYCLOVIR HCL 500 MG PO TABS: 500.0000 mg | ORAL_TABLET | Freq: Two times a day (BID) | ORAL | 12 refills | Status: DC

## 2017-12-28 NOTE — Patient Instructions (Signed)

## 2017-12-28 NOTE — Progress Notes (Signed)
Kristie [name] 10-Aug-1980 [id number]    History:    Presents for annual exam.  Regular monthly cycles/condoms. Same partner for 3 months with negative STD screen/partner vasectomy. .  12/2016 LEEP [facility] for persistent CIN-1 margins negative.  Anxiety and depression does see a counselor weekly.  Also dealing with fatigue, being treated for mold exposure.  HSV which is associated with urinary symptoms, takes Valtrex daily with fair relief of symptoms.  Never outbreak.  Has cervical bulging disks has follow-up scheduled.  Past medical history, past surgical history, family history and social history were all reviewed and documented in the EPIC chart.  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  Sister lives in [place].  Mother local with numerous health problems.  ROS:  A ROS was performed and pertinent positives and negatives are included.  Exam:  Vitals:   12/28/17 1515  BP: 110/72  Weight: 131 lb 9.6 oz (59.7 kg)  Height: 5\' 7"  (1.702 m)   Body mass index is 20.61 kg/m.   General appearance:  Normal Thyroid:  Symmetrical, normal in size, without palpable masses or nodularity. Respiratory  Auscultation:  Clear without wheezing or rhonchi Cardiovascular  Auscultation:  Regular rate, without rubs, murmurs or gallops  Edema/varicosities:  Not grossly evident Abdominal  Soft,nontender, without masses, guarding or rebound.  Liver/spleen:  No organomegaly noted  Hernia:  None appreciated  Skin  Inspection:  Grossly normal   Breasts: Examined lying and sitting.     Right: Without masses, retractions, discharge or axillary adenopathy.     Left: Without masses, retractions, discharge or axillary adenopathy. Gentitourinary   Inguinal/mons:  Normal without inguinal adenopathy  External genitalia:  Normal  BUS/Urethra/Skene's glands:  Normal  Vagina:  Normal  Cervix:  Normal  Uterus:  normal in size, shape and contour.  Midline and mobile  Adnexa/parametria:     Rt: Without masses or  tenderness.   Lt: Without masses or tenderness.  Anus and perineum: Normal  Digital rectal exam: Normal sphincter tone without palpated masses or tenderness  Assessment/Plan:  37 y.o. S W[facility] G0 for annual exam with no GYN complaints.  Monthly cycle/vasectomy/condoms 12/2016 LEEP for persistent LGSIL  HSV on Valtrex daily Homeopathic mold exposure treatment Anxiety/depression-counseling  Plan: Contraception options reviewed and declines will continue condoms.  SBE's, annual screening mammogram at 40, calcium rich foods, vitamin D 1000 daily encouraged.  Reviewed importance of leisure activities, regular exercise and self-care.  Continue counseling as needed.  Pap with HR HPV typing.    Huel Cote [facility], 4:37 PM 12/28/2017

## 2017-12-30 LAB — PAP, TP IMAGING W/ HPV RNA, RFLX HPV TYPE 16,18/45: HPV DNA High Risk: NOT DETECTED

## 2018-01-02 MED FILL — CHOLESTYRAMINE LIGHT POWDER: 4 | 84 days supply | Qty: 840 | Fill #1

## 2018-01-05 ENCOUNTER — Encounter: Payer: Self-pay | Admitting: Women's Health

## 2018-01-05 ENCOUNTER — Other Ambulatory Visit: Payer: Self-pay | Admitting: Neurosurgery

## 2018-01-05 ENCOUNTER — Encounter: Payer: Self-pay | Admitting: Sports Medicine

## 2018-01-05 DIAGNOSIS — M4722 Other spondylosis with radiculopathy, cervical region: Secondary | ICD-10-CM | POA: Diagnosis not present

## 2018-01-05 DIAGNOSIS — M542 Cervicalgia: Secondary | ICD-10-CM | POA: Diagnosis not present

## 2018-01-05 DIAGNOSIS — M5030 Other cervical disc degeneration, unspecified cervical region: Secondary | ICD-10-CM | POA: Diagnosis not present

## 2018-01-05 DIAGNOSIS — R030 Elevated blood-pressure reading, without diagnosis of hypertension: Secondary | ICD-10-CM | POA: Diagnosis not present

## 2018-01-05 DIAGNOSIS — M5020 Other cervical disc displacement, unspecified cervical region: Secondary | ICD-10-CM | POA: Diagnosis not present

## 2018-01-05 DIAGNOSIS — M5000 Cervical disc disorder with myelopathy, unspecified cervical region: Secondary | ICD-10-CM | POA: Diagnosis not present

## 2018-01-16 ENCOUNTER — Other Ambulatory Visit: Payer: Self-pay | Admitting: Neurosurgery

## 2018-01-23 NOTE — Pre-Procedure Instructions (Signed)
Kristie [name]  01/23/2018      [place], [place] - [address] [address] Phone: [phone] Fax: [phone]    Your procedure is scheduled on Wed., Aug. 7, 2019 from 8:30AM-11:40AM  Report to [facility] Admitting Entrance "A" at 6:30AM  Call this number if you have problems the morning of surgery:  [phone]   Remember:  Do not eat or drink after midnight on Aug. 6th    Take these medicines the morning of surgery with A SIP OF WATER: ValACYclovir (VALTREX)   If needed Diazepam (VALIUM) and Albuterol Inhaler (Bring with you the day of surgery)  7 days before surgery stop taking all Other Aspirin Products, Vitamins, Fish oils, and Herbal medications. Also stop all NSAIDS i.e. Advil, Ibuprofen, Motrin, Aleve, Anaprox, Naproxen, BC, Goody Powders, and all Supplements.   Do not wear jewelry, make-up or nail polish.  Do not wear lotions, powders, or perfumes, or deodorant.  Do not shave 48 hours prior to surgery.    Do not bring valuables to the hospital.  [facility] is not responsible for any belongings or valuables.  Contacts, dentures or bridgework may not be worn into surgery.  Leave your suitcase in the car.  After surgery it may be brought to your room.  For patients admitted to the hospital, discharge time will be determined by your treatment team.  Patients discharged the day of surgery will not be allowed to drive home.   Special instructions:   - Preparing For Surgery  Before surgery, you can play an important role. Because skin is not sterile, your skin needs to be as free of germs as possible. You can reduce the number of germs on your skin by washing with CHG (chlorahexidine gluconate) Soap before surgery.  CHG is an antiseptic cleaner which kills germs and bonds with the skin to continue killing germs even after washing.    Oral Hygiene is also important to  reduce your risk of infection.  Remember - BRUSH YOUR TEETH THE MORNING OF SURGERY WITH YOUR REGULAR TOOTHPASTE  Please do not use if you have an allergy to CHG or antibacterial soaps. If your skin becomes reddened/irritated stop using the CHG.  Do not shave (including legs and underarms) for at least 48 hours prior to first CHG shower. It is OK to shave your face.  Please follow these instructions carefully.   1. Shower the NIGHT BEFORE SURGERY and the MORNING OF SURGERY with CHG.   2. If you chose to wash your hair, wash your hair first as usual with your normal shampoo.  3. After you shampoo, rinse your hair and body thoroughly to remove the shampoo.  4. Use CHG as you would any other liquid soap. You can apply CHG directly to the skin and wash gently with a scrungie or a clean washcloth.   5. Apply the CHG Soap to your body ONLY FROM THE NECK DOWN.  Do not use on open wounds or open sores. Avoid contact with your eyes, ears, mouth and genitals (private parts). Wash Face and genitals (private parts)  with your normal soap.  6. Wash thoroughly, paying special attention to the area where your surgery will be performed.  7. Thoroughly rinse your body with warm water from the neck down.  8. DO NOT shower/wash with your normal soap after using and rinsing off the CHG Soap.  9. Pat yourself  dry with a CLEAN TOWEL.  10. Wear CLEAN PAJAMAS to bed the night before surgery, wear comfortable clothes the morning of surgery  11. Place CLEAN SHEETS on your bed the night of your first shower and DO NOT SLEEP WITH PETS.  Day of Surgery:  Do not apply any deodorants/lotions.  Please wear clean clothes to the hospital/surgery center.   Remember to brush your teeth WITH YOUR REGULAR TOOTHPASTE.  Please read over the following fact sheets that you were given. Pain Booklet, Coughing and Deep Breathing, MRSA Information and Surgical Site Infection Prevention

## 2018-01-24 ENCOUNTER — Encounter (HOSPITAL_COMMUNITY)
Admission: RE | Admit: 2018-01-24 | Discharge: 2018-01-24 | Disposition: A | Discharge status: Home / Self Care | Payer: 59 | Source: Ambulatory Visit | Attending: Neurosurgery | Admitting: Neurosurgery

## 2018-01-24 ENCOUNTER — Other Ambulatory Visit: Payer: Self-pay

## 2018-01-24 ENCOUNTER — Encounter (HOSPITAL_COMMUNITY): Payer: Self-pay

## 2018-01-24 DIAGNOSIS — Z01812 Encounter for preprocedural laboratory examination: Secondary | ICD-10-CM | POA: Diagnosis not present

## 2018-01-24 HISTORY — DX: Adverse effect of unspecified anesthetic, initial encounter: T41.45XA

## 2018-01-24 HISTORY — DX: Cervical disc disorder with myelopathy, unspecified cervical region: M50.00

## 2018-01-24 HISTORY — DX: Other complications of anesthesia, initial encounter: T88.59XA

## 2018-01-24 LAB — TYPE AND SCREEN
ABO/RH(D): O POS
Antibody Screen: NEGATIVE

## 2018-01-24 LAB — CBC
HCT: 43.8 % (ref 36.0–46.0)
Hemoglobin: 14 g/dL (ref 12.0–15.0)
MCH: 31.9 pg (ref 26.0–34.0)
MCHC: 32 g/dL (ref 30.0–36.0)
MCV: 99.8 fL (ref 78.0–100.0)
Platelets: 186 10*3/uL (ref 150–400)
RBC: 4.39 MIL/uL (ref 3.87–5.11)
RDW: 12.1 % (ref 11.5–15.5)
WBC: 4.4 10*3/uL (ref 4.0–10.5)

## 2018-01-24 LAB — BASIC METABOLIC PANEL
Anion gap: 10 (ref 5–15)
BUN: 11 mg/dL (ref 6–20)
CO2: 23 mmol/L (ref 22–32)
Calcium: 9.6 mg/dL (ref 8.9–10.3)
Chloride: 104 mmol/L (ref 98–111)
Creatinine, Ser: 0.72 mg/dL (ref 0.44–1.00)
GFR calc Af Amer: >60 mL/min (ref >60)
GFR calc non Af Amer: >60 mL/min (ref >60)
Glucose, Bld: 92 mg/dL (ref 70–99)
Potassium: 4 mmol/L (ref 3.5–5.1)
Sodium: 137 mmol/L (ref 135–145)

## 2018-01-24 LAB — SURGICAL PCR SCREEN
MRSA, PCR: NEGATIVE
Staphylococcus aureus: NEGATIVE

## 2018-01-24 LAB — ABO/RH: ABO/RH(D): O POS

## 2018-01-24 NOTE — Progress Notes (Signed)
PCP - Dr. Danae Chen Wallace/ PA Flo Shanks  Cardiologist - Denies  Chest x-ray - Denies  EKG - Denies  Stress Test -Denies   ECHO - Denies  Cardiac Cath - Denies  Sleep Study - Denies CPAP - None  LABS- 01/24/18: CBC, BMP, T/S, PCR 02/01/18: POC Upreg  ASA- Denies   Anesthesia- No  Pt denies having chest pain, sob, or fever at this time. All instructions explained to the pt, with a verbal understanding of the material. Pt agrees to go over the instructions while at home for a better understanding. The opportunity to ask questions was provided.

## 2018-01-25 MED FILL — VALACYCLOVIR HCL 500 MG TAB: 500 | 90 days supply | Qty: 180 | Fill #0

## 2018-02-01 ENCOUNTER — Ambulatory Visit (HOSPITAL_COMMUNITY)
Admission: RE | Disposition: A | Discharge status: Home / Self Care | Payer: Self-pay | Source: Ambulatory Visit | Attending: Neurosurgery

## 2018-02-01 ENCOUNTER — Other Ambulatory Visit: Payer: Self-pay

## 2018-02-01 ENCOUNTER — Encounter (HOSPITAL_COMMUNITY): Payer: Self-pay | Admitting: Surgery

## 2018-02-01 ENCOUNTER — Ambulatory Visit (HOSPITAL_COMMUNITY): Payer: 59 | Admitting: Anesthesiology

## 2018-02-01 ENCOUNTER — Observation Stay (HOSPITAL_COMMUNITY)
Admission: RE | Admit: 2018-02-01 | Discharge: 2018-02-01 | Disposition: A | Discharge status: Home / Self Care | Payer: 59 | Source: Ambulatory Visit | Attending: Neurosurgery | Admitting: Neurosurgery

## 2018-02-01 ENCOUNTER — Ambulatory Visit (HOSPITAL_COMMUNITY): Payer: 59

## 2018-02-01 DIAGNOSIS — Z87891 Personal history of nicotine dependence: Secondary | ICD-10-CM | POA: Insufficient documentation

## 2018-02-01 DIAGNOSIS — M4722 Other spondylosis with radiculopathy, cervical region: Secondary | ICD-10-CM | POA: Insufficient documentation

## 2018-02-01 DIAGNOSIS — M4322 Fusion of spine, cervical region: Secondary | ICD-10-CM | POA: Diagnosis not present

## 2018-02-01 DIAGNOSIS — Z79899 Other long term (current) drug therapy: Secondary | ICD-10-CM | POA: Insufficient documentation

## 2018-02-01 DIAGNOSIS — M4712 Other spondylosis with myelopathy, cervical region: Secondary | ICD-10-CM | POA: Diagnosis not present

## 2018-02-01 DIAGNOSIS — J45909 Unspecified asthma, uncomplicated: Secondary | ICD-10-CM | POA: Insufficient documentation

## 2018-02-01 DIAGNOSIS — M50222 Other cervical disc displacement at C5-C6 level: Secondary | ICD-10-CM | POA: Diagnosis not present

## 2018-02-01 DIAGNOSIS — M50123 Cervical disc disorder at C6-C7 level with radiculopathy: Secondary | ICD-10-CM | POA: Diagnosis not present

## 2018-02-01 DIAGNOSIS — Z419 Encounter for procedure for purposes other than remedying health state, unspecified: Secondary | ICD-10-CM

## 2018-02-01 DIAGNOSIS — M50122 Cervical disc disorder at C5-C6 level with radiculopathy: Secondary | ICD-10-CM | POA: Diagnosis not present

## 2018-02-01 DIAGNOSIS — F419 Anxiety disorder, unspecified: Secondary | ICD-10-CM | POA: Diagnosis not present

## 2018-02-01 DIAGNOSIS — M5030 Other cervical disc degeneration, unspecified cervical region: Secondary | ICD-10-CM | POA: Diagnosis not present

## 2018-02-01 DIAGNOSIS — M50223 Other cervical disc displacement at C6-C7 level: Secondary | ICD-10-CM | POA: Diagnosis not present

## 2018-02-01 DIAGNOSIS — M50023 Cervical disc disorder at C6-C7 level with myelopathy: Secondary | ICD-10-CM | POA: Diagnosis not present

## 2018-02-01 DIAGNOSIS — M5020 Other cervical disc displacement, unspecified cervical region: Secondary | ICD-10-CM | POA: Diagnosis present

## 2018-02-01 HISTORY — PX: ANTERIOR CERVICAL DECOMP/DISCECTOMY FUSION: SHX1161

## 2018-02-01 LAB — POCT PREGNANCY, URINE: Preg Test, Ur: NEGATIVE

## 2018-02-01 SURGERY — ANTERIOR CERVICAL DECOMPRESSION/DISCECTOMY FUSION 2 LEVELS
Anesthesia: General | Site: Spine Cervical

## 2018-02-01 MED ORDER — KETOROLAC TROMETHAMINE 30 MG/ML IJ SOLN
INTRAMUSCULAR | Status: AC
  Filled 2018-02-01: qty 1

## 2018-02-01 MED ORDER — CYCLOBENZAPRINE HCL 5 MG PO TABS
5.0000 mg | ORAL_TABLET | Freq: Three times a day (TID) | ORAL | Status: DC | PRN
  Administered 2018-02-01: 5 mg via ORAL
  Filled 2018-02-01: qty 1

## 2018-02-01 MED ORDER — CHLORHEXIDINE GLUCONATE CLOTH 2 % EX PADS: 6.0000 | MEDICATED_PAD | Freq: Once | CUTANEOUS | Status: DC

## 2018-02-01 MED ORDER — KCL IN DEXTROSE-NACL 20-5-0.45 MEQ/L-%-% IV SOLN: INTRAVENOUS | Status: DC

## 2018-02-01 MED ORDER — HYDROCODONE-ACETAMINOPHEN 5-325 MG PO TABS
1.0000 | ORAL_TABLET | ORAL | Status: DC | PRN
  Administered 2018-02-01 (×2): 1 via ORAL
  Filled 2018-02-01 (×2): qty 1

## 2018-02-01 MED ORDER — FLEET ENEMA 7-19 GM/118ML RE ENEM: 1.0000 | ENEMA | Freq: Once | RECTAL | Status: DC | PRN

## 2018-02-01 MED ORDER — VALACYCLOVIR HCL 500 MG PO TABS
500.0000 mg | ORAL_TABLET | Freq: Two times a day (BID) | ORAL | Status: DC
  Filled 2018-02-01: qty 1

## 2018-02-01 MED ORDER — BUPIVACAINE HCL (PF) 0.5 % IJ SOLN
INTRAMUSCULAR | Status: AC
  Filled 2018-02-01: qty 30

## 2018-02-01 MED ORDER — EPHEDRINE 5 MG/ML INJ
INTRAVENOUS | Status: AC
  Filled 2018-02-01: qty 10

## 2018-02-01 MED ORDER — HYDROXYZINE HCL 50 MG/ML IM SOLN: 50.0000 mg | INTRAMUSCULAR | Status: DC | PRN

## 2018-02-01 MED ORDER — ACETAMINOPHEN 325 MG PO TABS: 650.0000 mg | ORAL_TABLET | ORAL | Status: DC | PRN

## 2018-02-01 MED ORDER — THROMBIN 20000 UNITS EX SOLR
CUTANEOUS | Status: DC | PRN
  Administered 2018-02-01: 20 mL via TOPICAL

## 2018-02-01 MED ORDER — MAGNESIUM HYDROXIDE 400 MG/5ML PO SUSP: 30.0000 mL | Freq: Every day | ORAL | Status: DC | PRN

## 2018-02-01 MED ORDER — ACETAMINOPHEN 10 MG/ML IV SOLN
INTRAVENOUS | Status: AC
  Filled 2018-02-01: qty 100

## 2018-02-01 MED ORDER — SODIUM CHLORIDE 0.9 % IV SOLN: 250.0000 mL | INTRAVENOUS | Status: DC

## 2018-02-01 MED ORDER — LACTATED RINGERS IV SOLN
INTRAVENOUS | Status: DC | PRN
Start: 2018-02-01 — End: 2018-02-01
  Administered 2018-02-01: 08:00:00 via INTRAVENOUS

## 2018-02-01 MED ORDER — SUGAMMADEX SODIUM 200 MG/2ML IV SOLN
INTRAVENOUS | Status: DC | PRN
  Administered 2018-02-01: 117 mg via INTRAVENOUS

## 2018-02-01 MED ORDER — 0.9 % SODIUM CHLORIDE (POUR BTL) OPTIME
TOPICAL | Status: DC | PRN
  Administered 2018-02-01: 1000 mL

## 2018-02-01 MED ORDER — BUPIVACAINE HCL (PF) 0.5 % IJ SOLN
INTRAMUSCULAR | Status: DC | PRN
  Administered 2018-02-01: 6 mL

## 2018-02-01 MED ORDER — SODIUM CHLORIDE 0.9% FLUSH
3.0000 mL | Freq: Two times a day (BID) | INTRAVENOUS | Status: DC
  Administered 2018-02-01: 3 mL via INTRAVENOUS

## 2018-02-01 MED ORDER — KETOROLAC TROMETHAMINE 30 MG/ML IJ SOLN
30.0000 mg | Freq: Once | INTRAMUSCULAR | Status: AC
  Administered 2018-02-01: 30 mg via INTRAVENOUS

## 2018-02-01 MED ORDER — MIDAZOLAM HCL 2 MG/2ML IJ SOLN
INTRAMUSCULAR | Status: AC
  Filled 2018-02-01: qty 2

## 2018-02-01 MED ORDER — ACETAMINOPHEN 10 MG/ML IV SOLN
INTRAVENOUS | Status: DC | PRN
  Administered 2018-02-01: 1000 mg via INTRAVENOUS

## 2018-02-01 MED ORDER — PHENOL 1.4 % MT LIQD: 1.0000 | OROMUCOSAL | Status: DC | PRN

## 2018-02-01 MED ORDER — CEFAZOLIN SODIUM-DEXTROSE 2-4 GM/100ML-% IV SOLN
2.0000 g | INTRAVENOUS | Status: AC
  Administered 2018-02-01: 2 g via INTRAVENOUS
  Filled 2018-02-01: qty 100

## 2018-02-01 MED ORDER — DIAZEPAM 5 MG/ML IJ SOLN
INTRAMUSCULAR | Status: AC
  Filled 2018-02-01: qty 2

## 2018-02-01 MED ORDER — FENTANYL CITRATE (PF) 100 MCG/2ML IJ SOLN
INTRAMUSCULAR | Status: AC
  Filled 2018-02-01: qty 2

## 2018-02-01 MED ORDER — SODIUM CHLORIDE 0.9 % IV SOLN
INTRAVENOUS | Status: DC | PRN
  Administered 2018-02-01: 500 mL

## 2018-02-01 MED ORDER — HYDROCODONE-ACETAMINOPHEN 5-325 MG PO TABS: 1.0000 | ORAL_TABLET | Freq: Four times a day (QID) | ORAL | 0 refills | Status: DC | PRN

## 2018-02-01 MED ORDER — ONDANSETRON HCL 4 MG/2ML IJ SOLN
INTRAMUSCULAR | Status: DC | PRN
  Administered 2018-02-01: 4 mg via INTRAVENOUS

## 2018-02-01 MED ORDER — FENTANYL CITRATE (PF) 100 MCG/2ML IJ SOLN
25.0000 ug | INTRAMUSCULAR | Status: DC | PRN
Start: 2018-02-01 — End: 2018-02-01
  Administered 2018-02-01 (×3): 50 ug via INTRAVENOUS

## 2018-02-01 MED ORDER — THROMBIN (RECOMBINANT) 20000 UNITS EX SOLR
CUTANEOUS | Status: AC
  Filled 2018-02-01: qty 20000

## 2018-02-01 MED ORDER — OXYCODONE HCL 5 MG PO TABS
5.0000 mg | ORAL_TABLET | Freq: Once | ORAL | Status: DC | PRN
  Administered 2018-02-01: 5 mg via ORAL

## 2018-02-01 MED ORDER — LIDOCAINE 2% (20 MG/ML) 5 ML SYRINGE
INTRAMUSCULAR | Status: DC | PRN
  Administered 2018-02-01: 40 mg via INTRAVENOUS

## 2018-02-01 MED ORDER — BISACODYL 10 MG RE SUPP: 10.0000 mg | Freq: Every day | RECTAL | Status: DC | PRN

## 2018-02-01 MED ORDER — LIDOCAINE-EPINEPHRINE 1 %-1:100000 IJ SOLN
INTRAMUSCULAR | Status: DC | PRN
  Administered 2018-02-01: 6 mL

## 2018-02-01 MED ORDER — EPHEDRINE SULFATE-NACL 50-0.9 MG/10ML-% IV SOSY
PREFILLED_SYRINGE | INTRAVENOUS | Status: DC | PRN
  Administered 2018-02-01: 10 mg via INTRAVENOUS

## 2018-02-01 MED ORDER — OXYCODONE HCL 5 MG/5ML PO SOLN: 5.0000 mg | Freq: Once | ORAL | Status: DC | PRN

## 2018-02-01 MED ORDER — DEXAMETHASONE SODIUM PHOSPHATE 10 MG/ML IJ SOLN
INTRAMUSCULAR | Status: DC | PRN
  Administered 2018-02-01: 10 mg via INTRAVENOUS

## 2018-02-01 MED ORDER — MENTHOL 3 MG MT LOZG: 1.0000 | LOZENGE | OROMUCOSAL | Status: DC | PRN

## 2018-02-01 MED ORDER — KETOROLAC TROMETHAMINE 30 MG/ML IJ SOLN
30.0000 mg | Freq: Four times a day (QID) | INTRAMUSCULAR | Status: DC
  Administered 2018-02-01: 30 mg via INTRAVENOUS
  Filled 2018-02-01: qty 1

## 2018-02-01 MED ORDER — DIAZEPAM 5 MG/ML IJ SOLN
2.5000 mg | Freq: Once | INTRAMUSCULAR | Status: AC
  Administered 2018-02-01: 2.5 mg via INTRAVENOUS

## 2018-02-01 MED ORDER — HYDROXYZINE HCL 50 MG PO TABS
50.0000 mg | ORAL_TABLET | ORAL | Status: DC | PRN
  Filled 2018-02-01: qty 1

## 2018-02-01 MED ORDER — ROCURONIUM BROMIDE 10 MG/ML (PF) SYRINGE
PREFILLED_SYRINGE | INTRAVENOUS | Status: DC | PRN
  Administered 2018-02-01: 20 mg via INTRAVENOUS
  Administered 2018-02-01: 10 mg via INTRAVENOUS
  Administered 2018-02-01: 40 mg via INTRAVENOUS

## 2018-02-01 MED ORDER — OXYCODONE HCL 5 MG PO TABS
ORAL_TABLET | ORAL | Status: AC
  Filled 2018-02-01: qty 1

## 2018-02-01 MED ORDER — ALUM & MAG HYDROXIDE-SIMETH 200-200-20 MG/5ML PO SUSP: 30.0000 mL | Freq: Four times a day (QID) | ORAL | Status: DC | PRN

## 2018-02-01 MED ORDER — PROPOFOL 10 MG/ML IV BOLUS
INTRAVENOUS | Status: DC | PRN
  Administered 2018-02-01: 110 mg via INTRAVENOUS

## 2018-02-01 MED ORDER — FENTANYL CITRATE (PF) 250 MCG/5ML IJ SOLN
INTRAMUSCULAR | Status: AC
  Filled 2018-02-01: qty 5

## 2018-02-01 MED ORDER — FENTANYL CITRATE (PF) 250 MCG/5ML IJ SOLN
INTRAMUSCULAR | Status: DC | PRN
  Administered 2018-02-01 (×2): 50 ug via INTRAVENOUS
  Administered 2018-02-01: 100 ug via INTRAVENOUS

## 2018-02-01 MED ORDER — ACETAMINOPHEN 650 MG RE SUPP: 650.0000 mg | RECTAL | Status: DC | PRN

## 2018-02-01 MED ORDER — SODIUM CHLORIDE 0.9% FLUSH: 3.0000 mL | INTRAVENOUS | Status: DC | PRN

## 2018-02-01 MED ORDER — HEMOSTATIC AGENTS (NO CHARGE) OPTIME
TOPICAL | Status: DC | PRN
  Administered 2018-02-01 (×2): 1 via TOPICAL

## 2018-02-01 MED ORDER — MIDAZOLAM HCL 5 MG/5ML IJ SOLN
INTRAMUSCULAR | Status: DC | PRN
  Administered 2018-02-01: 2 mg via INTRAVENOUS

## 2018-02-01 MED ORDER — MORPHINE SULFATE (PF) 4 MG/ML IV SOLN: 4.0000 mg | INTRAVENOUS | Status: DC | PRN

## 2018-02-01 MED ORDER — LIDOCAINE-EPINEPHRINE 1 %-1:100000 IJ SOLN
INTRAMUSCULAR | Status: AC
  Filled 2018-02-01: qty 1

## 2018-02-01 MED ORDER — PROPOFOL 10 MG/ML IV BOLUS
INTRAVENOUS | Status: AC
Start: 2018-02-01 — End: ?
  Filled 2018-02-01: qty 40

## 2018-02-01 SURGICAL SUPPLY — 65 items
ADH SKN CLS APL DERMABOND .7 (GAUZE/BANDAGES/DRESSINGS) ×1
ALLOGRAFT 7X14X11 (Bone Implant) ×1 IMPLANT
BAG DECANTER FOR FLEXI CONT (MISCELLANEOUS) ×2 IMPLANT
BIT DRILL 14X2.5XNS TI ANT (BIT) IMPLANT
BIT DRILL AVIATOR 14 (BIT) ×2
BIT DRILL NEURO 2X3.1 SFT TUCH (MISCELLANEOUS) ×1 IMPLANT
BIT DRL 14X2.5XNS TI ANT (BIT) ×1
BLADE ULTRA TIP 2M (BLADE) ×1 IMPLANT
CANISTER SUCT 3000ML PPV (MISCELLANEOUS) ×2 IMPLANT
CARTRIDGE OIL MAESTRO DRILL (MISCELLANEOUS) ×1 IMPLANT
COVER MAYO STAND STRL (DRAPES) ×2 IMPLANT
DECANTER SPIKE VIAL GLASS SM (MISCELLANEOUS) ×2 IMPLANT
DERMABOND ADVANCED (GAUZE/BANDAGES/DRESSINGS) ×1
DERMABOND ADVANCED .7 DNX12 (GAUZE/BANDAGES/DRESSINGS) ×1 IMPLANT
DIFFUSER DRILL AIR PNEUMATIC (MISCELLANEOUS) ×2 IMPLANT
DRAPE HALF SHEET 40X57 (DRAPES) ×1 IMPLANT
DRAPE LAPAROTOMY 100X72 PEDS (DRAPES) ×2 IMPLANT
DRAPE MICROSCOPE LEICA (MISCELLANEOUS) ×2 IMPLANT
DRAPE POUCH INSTRU U-SHP 10X18 (DRAPES) ×2 IMPLANT
DRILL NEURO 2X3.1 SOFT TOUCH (MISCELLANEOUS) ×2
ELECT COATED BLADE 2.86 ST (ELECTRODE) ×2 IMPLANT
ELECT REM PT RETURN 9FT ADLT (ELECTROSURGICAL) ×2
ELECTRODE REM PT RTRN 9FT ADLT (ELECTROSURGICAL) ×1 IMPLANT
FLOSEAL 5ML (HEMOSTASIS) ×2 IMPLANT
GLOVE BIOGEL PI IND STRL 6.5 (GLOVE) IMPLANT
GLOVE BIOGEL PI IND STRL 7.5 (GLOVE) IMPLANT
GLOVE BIOGEL PI IND STRL 8 (GLOVE) ×1 IMPLANT
GLOVE BIOGEL PI INDICATOR 6.5 (GLOVE) ×2
GLOVE BIOGEL PI INDICATOR 7.5 (GLOVE) ×3
GLOVE BIOGEL PI INDICATOR 8 (GLOVE) ×1
GLOVE ECLIPSE 7.5 STRL STRAW (GLOVE) ×2 IMPLANT
GLOVE EXAM NITRILE LRG STRL (GLOVE) IMPLANT
GLOVE EXAM NITRILE XL STR (GLOVE) IMPLANT
GLOVE EXAM NITRILE XS STR PU (GLOVE) IMPLANT
GLOVE SURG SS PI 7.5 STRL IVOR (GLOVE) ×3 IMPLANT
GOWN STRL REUS W/ TWL LRG LVL3 (GOWN DISPOSABLE) IMPLANT
GOWN STRL REUS W/ TWL XL LVL3 (GOWN DISPOSABLE) ×1 IMPLANT
GOWN STRL REUS W/TWL 2XL LVL3 (GOWN DISPOSABLE) IMPLANT
GOWN STRL REUS W/TWL LRG LVL3 (GOWN DISPOSABLE) ×2
GOWN STRL REUS W/TWL XL LVL3 (GOWN DISPOSABLE) ×4
GRAFT CORT CANC 14X8.25X11 5D (Bone Implant) ×1 IMPLANT
HALTER HD/CHIN CERV TRACTION D (MISCELLANEOUS) ×2 IMPLANT
HEMOSTAT POWDER KIT SURGIFOAM (HEMOSTASIS) ×1 IMPLANT
KIT BASIN OR (CUSTOM PROCEDURE TRAY) ×2 IMPLANT
KIT TURNOVER KIT B (KITS) ×2 IMPLANT
NDL HYPO 25X1 1.5 SAFETY (NEEDLE) ×1 IMPLANT
NDL SPNL 22GX3.5 QUINCKE BK (NEEDLE) ×1 IMPLANT
NEEDLE HYPO 25X1 1.5 SAFETY (NEEDLE) ×2 IMPLANT
NEEDLE SPNL 22GX3.5 QUINCKE BK (NEEDLE) ×2 IMPLANT
NS IRRIG 1000ML POUR BTL (IV SOLUTION) ×2 IMPLANT
OIL CARTRIDGE MAESTRO DRILL (MISCELLANEOUS) ×2
PACK LAMINECTOMY NEURO (CUSTOM PROCEDURE TRAY) ×2 IMPLANT
PAD ARMBOARD 7.5X6 YLW CONV (MISCELLANEOUS) ×6 IMPLANT
PLATE AVIATOR ASSY 2LVL SZ 30 (Plate) ×1 IMPLANT
RUBBERBAND STERILE (MISCELLANEOUS) ×4 IMPLANT
SCREW AVIAT VAR SLFTAP 4.35X14 (Screw) ×1 IMPLANT
SCREW AVIATOR VAR SELFTAP 4X14 (Screw) ×6 IMPLANT
SPONGE INTESTINAL PEANUT (DISPOSABLE) ×2 IMPLANT
SPONGE SURGIFOAM ABS GEL 100 (HEMOSTASIS) ×2 IMPLANT
STAPLER SKIN PROX WIDE 3.9 (STAPLE) IMPLANT
SUT VIC AB 2-0 CP2 18 (SUTURE) ×2 IMPLANT
SUT VIC AB 3-0 SH 8-18 (SUTURE) ×2 IMPLANT
TOWEL GREEN STERILE (TOWEL DISPOSABLE) ×2 IMPLANT
TOWEL GREEN STERILE FF (TOWEL DISPOSABLE) ×2 IMPLANT
WATER STERILE IRR 1000ML POUR (IV SOLUTION) ×2 IMPLANT

## 2018-02-01 NOTE — Discharge Summary (Signed)
Physician Discharge Summary  Patient ID: Kristie [name] MRN: [id number] DOB/AGE: 1980/09/12 37 y.o.  Admit date: 02/01/2018 Discharge date: 02/01/2018  Admission Diagnoses: C5-6 and C6-7 cervical disc herniation, cervical degenerative disease, cervical spondylosis, cervical myeloradiculopathy  Discharge Diagnoses:  C5-6 and C6-7 cervical disc herniation, cervical degenerative disease, cervical spondylosis, cervical myeloradiculopathy Active Problems:   HNP (herniated nucleus pulposus), cervical   Discharged Condition: good  Hospital Course: Patient was admitted, underwent a two-level C5-6 and C6-7 anterior cervical decompression arthrodesis with structural allograft and cervical plating.  She is done well following surgery.  She is up and ambulating actively.  She is comfortable.  Her incision is healing nicely.  She has been given instructions regarding wound care and activities following discharge.  She is scheduled for follow-up with me in the office in 3 weeks with an x-ray.  Discharge Exam: Blood pressure 106/70, pulse (!) 49, temperature 97.7 F (36.5 C), temperature source Oral, resp. rate 16, height 5' 6"  (1.676 m), weight 58.5 kg (129 lb), last menstrual period 01/06/2018, SpO2 100 %.  Disposition: Discharge disposition: 01-Home or Self Care       Discharge Instructions    Discharge wound care:   Complete by:  As directed    Leave the wound open to air. Shower daily with the wound uncovered. Water and soapy water should run over the incision area. Do not wash directly on the incision for 2 weeks. Remove the glue after 2 weeks.   Driving Restrictions   Complete by:  As directed    No driving for 2 weeks. May ride in the car locally now. May begin to drive locally in 2 weeks.   Other Restrictions   Complete by:  As directed    Walk gradually increasing distances out in the fresh air at least twice a day. Walking additional 6 times inside the house, gradually increasing  distances, daily. No bending, lifting, or twisting. Perform activities between shoulder and waist height (that is at counter height when standing or table height when sitting).     Allergies as of 02/01/2018      Reactions   Acyclovir And Related Nausea Only, Other (See Comments)   Abdominal pain dizziness.   Tindamax [tinidazole]    UNSPECIFIED REACTION    Sertraline Hcl Other (See Comments)   Paranoia.       Medication List    TAKE these medications   albuterol 108 (90 Base) MCG/ACT inhaler Commonly known as:  PROVENTIL HFA;VENTOLIN HFA Inhale 2 puffs every 6 (six) hours as needed into the lungs for wheezing or shortness of breath.   ASHWAGANDHA PO Take 450 mg by mouth daily.   Bentonite Powd Take 5 mLs by mouth daily.   Black Currant Seed Oil 500 MG Caps Take 500 mg by mouth 2 (two) times daily.   BOSWELLIA PO Take 1 capsule by mouth daily.   CHARCOAL PO Take 1,120 mg by mouth 2 (two) times daily.   cholestyramine 4 GM/DOSE powder Commonly known as:  QUESTRAN Take 1 g by mouth 2 (two) times daily with a meal.   Creatine Powd Take 1 Scoop by mouth daily.   diazepam 5 MG tablet Commonly known as:  VALIUM Take 1 tablet (5 mg total) by mouth every 12 (twelve) hours as needed for anxiety.   HYDROcodone-acetaminophen 5-325 MG tablet Commonly known as:  NORCO/VICODIN Take 1 tablet by mouth every 6 (six) hours as needed (pain).   MAGNESIUM OXIDE PO Take 5 mLs by mouth  2 (two) times daily.   Melatonin 10 MG Tabs Take 10 mg by mouth at bedtime as needed (sleep).   NAC 600 600 MG Caps Generic drug:  Acetylcysteine Take 600 mg by mouth daily.   OVER THE COUNTER MEDICATION Take 30 drops by mouth 2 (two) times daily. Teakana Kit   OVER THE COUNTER MEDICATION Take 1 drop by mouth 2 (two) times daily. BBVII Oil   OVER THE COUNTER MEDICATION Take 1 capsule by mouth daily. Curgard   OVER THE COUNTER MEDICATION Take 1 capsule by mouth daily. Stephania Root    valACYclovir 500 MG tablet Commonly known as:  VALTREX Take 1 tablet (500 mg total) by mouth 2 (two) times daily.   VITAMIN C PO Take 560 mg by mouth 2 (two) times daily.   VITAMIN D PO Take 1 capsule by mouth daily.   VITAMIN K PO Take 1 capsule by mouth daily.   ZINC ACETATE PO Take 1 capsule by mouth daily.            Discharge Care Instructions  (From admission, onward)        Start     Ordered   02/01/18 0000  Discharge wound care:    Comments:  Leave the wound open to air. Shower daily with the wound uncovered. Water and soapy water should run over the incision area. Do not wash directly on the incision for 2 weeks. Remove the glue after 2 weeks.   02/01/18 1842       Signed: Hosie Spangle 02/01/2018, 6:42 PM

## 2018-02-01 NOTE — Anesthesia Procedure Notes (Signed)
Procedure Name: Intubation Date/Time: 02/01/2018 8:34 AM Performed by: Renato Shin, CRNA Pre-anesthesia Checklist: Patient identified, Emergency Drugs available, Suction available and Patient being monitored Patient Re-evaluated:Patient Re-evaluated prior to induction Oxygen Delivery Method: Circle system utilized Preoxygenation: Pre-oxygenation with 100% oxygen Induction Type: IV induction Ventilation: Mask ventilation without difficulty Laryngoscope Size: Miller and 3 Grade View: Grade I Tube type: Oral Tube size: 7.0 mm Number of attempts: 1 Airway Equipment and Method: Stylet Placement Confirmation: ETT inserted through vocal cords under direct vision,  positive ETCO2,  CO2 detector and breath sounds checked- equal and bilateral Secured at: 22 cm Tube secured with: Tape Dental Injury: Teeth and Oropharynx as per pre-operative assessment

## 2018-02-01 NOTE — Op Note (Signed)
02/01/2018  10:56 AM  PATIENT:  Kristie [name]  37 y.o. female  PRE-OPERATIVE DIAGNOSIS: C5-6 and C6-7 cervical disc herniation, cervical degenerative disease, cervical spondylosis, cervical myeloradiculopathy  POST-OPERATIVE DIAGNOSIS:  C5-6 and C6-7 cervical disc herniation, cervical degenerative disease, cervical spondylosis, cervical myeloradiculopathy  PROCEDURE:  Procedure(s): C5-6 and C6-7 anterior cervical decompression and arthrodesis with structural allograft and aviator cervical plating  SURGEON: Jovita Gamma, MD  ASSISTANTS: Sherley Bounds, MD  ANESTHESIA:   general  EBL:  Total I/O In: 900 [I.V.:900] Out: 150 [Blood:150]  BLOOD ADMINISTERED:none  COUNT:  Correct per nursing staff  DICTATION: Patient was brought to the operating room placed under general endotracheal anesthesia. Patient was placed in 10 pounds of halter traction. The neck was prepped with Betadine soap and solution and draped in a sterile fashion. A horizontal incision was made on the left side of the neck. The line of the incision was infiltrated with local anesthetic with epinephrine. Dissection was carried down thru the subcutaneous tissue and platysma, bipolar cautery was used to maintain hemostasis. Dissection was then carried out thru an avascular plane leaving the sternocleidomastoid carotid artery and jugular vein laterally and the trachea and esophagus medially. The ventral aspect of the vertebral column was identified and a localizing x-ray was taken. The C5-6 and C6-7 levels were identified. The annulus at each level was incised and the disc space entered. Discectomy was performed with micro-curettes and pituitary rongeurs. The operating microscope was draped and brought into the field provided additional magnification illumination and visualization. Discectomy was continued posteriorly thru the disc space and then the cartilaginous endplate was removed using micro-curettes along with the high-speed  drill. Posterior osteophytic overgrowth was removed each level using the high-speed drill along with a 2 mm thin footplated Kerrison punch. Posterior longitudinal ligament along with disc herniation was carefully removed, decompressing the spinal canal and thecal sac. We then continued to remove osteophytic overgrowth and disc material decompressing the neural foramina and exiting nerve roots bilaterally. Once the decompression was completed hemostasis was established at each level with the use of Gelfoam with thrombin and bipolar cautery. The Gelfoam was removed, a thin layer Surgifoam applied, the wound irrigated and hemostasis confirmed. We then measured the height of the intravertebral disc space level and selected a 7 millimeter in height structural allograft for the C5-6 level and a 8 millimeter in height structural allograft for the C6-7 level . Each was hydrated and saline solution and then gently positioned in the intravertebral disc space and countersunk. We then selected a 30 millimeter in height Aviator cervical plate. It was positioned over the fusion construct and secured to the vertebra with a pair of 4 x 14 mm self-tapping screws at the C5 level, a pair of 4 x 14 mm self-tapping screws at the C6 level, and a 4.35 x 14 mm self-tapping screw on the left and a 4 x 14 mm self-tapping screw on the right at the C7 level. Each screw hole was started with the high-speed drill and then the screws placed, once all the screws were placed, the locking system was secured. The wound was irrigated with bacitracin solution checked for hemostasis which was established and confirmed. An x-ray was taken which showed the graft in good position, the plate and screws in good position, and the overall construct appeared good. We then proceeded with closure. The platysma was closed with interrupted inverted 2-0 undyed Vicryl suture, the subcutaneous and subcuticular closed with interrupted inverted 3-0 undyed Vicryl suture.  The skin edges were approximated with Dermabond. Following surgery the patient was taken out of cervical traction. To be reversed and the anesthetic and taken to the recovery room for further care.   PLAN OF CARE: Admit for overnight observation  PATIENT DISPOSITION:  PACU - hemodynamically stable.   Delay start of Pharmacological VTE agent (>24hrs) due to surgical blood loss or risk of bleeding:  yes

## 2018-02-01 NOTE — H&P (Signed)
Subjective: Patient is a 37 y.o. right-handed white female who is admitted for treatment of cervical disc herniations, C5-6 and C6-7.  Patient has been having increasing difficulties with neck pain and bilateral cervical radiculopathy, left worse than right.  MRI scan shows large disc herniation with spinal cord compression at the C5-6 level and a small disc herniation at C6-7.  She is admitted now for a two-level C5-6 and C6-7 anterior cervical decompression and arthrodesis with structural allograft and cervical plating.  Symptomatically her radiculopathy is included pain, numbness, and tingling radiating through the upper extremities.   Patient Active Problem List   Diagnosis Date Noted  . Thoracic outlet syndrome 09/07/2017  . Dysesthesia of multiple sites 09/07/2017  . Thoracic back pain 02/18/2017  . Congenital cavus foot 02/18/2017  . History of cervical dysplasia 01/20/2017  . BV (bacterial vaginosis) 12/21/2016  . HSV-1 (herpes simplex virus 1) infection 07/08/2015  . Migraines 08/05/2011  . Allergic rhinitis 06/12/2010  . Asthma 06/05/2010   Past Medical History:  Diagnosis Date  . Anxiety   . Asthma, exercise induced   . Complication of anesthesia    Very sensitive to medicine  . Dysplasia of cervix, high grade CIN 2   . HNP (herniated nucleus pulposus) with myelopathy, cervical   . HPV in female   . Migraines   . Seasonal allergies     Past Surgical History:  Procedure Laterality Date  . LASER ABLATION OF THE CERVIX  2013   CIN-2  . VAGINAL CYST EXCISED  2001    Medications Prior to Admission  Medication Sig Dispense Refill Last Dose  . Acetylcysteine (NAC 600) 600 MG CAPS Take 600 mg by mouth daily.   Past Week at Unknown time  . Ascorbic Acid (VITAMIN C PO) Take 560 mg by mouth 2 (two) times daily.   Past Week at Unknown time  . ASHWAGANDHA PO Take 450 mg by mouth daily.   Past Week at Unknown time  . Bentonite POWD Take 5 mLs by mouth daily.   Past Week at  Unknown time  . Black Currant Seed Oil 500 MG CAPS Take 500 mg by mouth 2 (two) times daily.   Past Week at Unknown time  . Boswellia Serrata (BOSWELLIA PO) Take 1 capsule by mouth daily.   Past Week at Unknown time  . Charcoal Activated (CHARCOAL PO) Take 1,120 mg by mouth 2 (two) times daily.   Past Week at Unknown time  . Cholecalciferol (VITAMIN D PO) Take 1 capsule by mouth daily.   Past Week at Unknown time  . cholestyramine (QUESTRAN) 4 GM/DOSE powder Take 1 g by mouth 2 (two) times daily with a meal.    Past Week at Unknown time  . Creatine POWD Take 1 Scoop by mouth daily.   Past Week at Unknown time  . diazepam (VALIUM) 5 MG tablet Take 1 tablet (5 mg total) by mouth every 12 (twelve) hours as needed for anxiety. 20 tablet 1 01/31/2018 at Unknown time  . MAGNESIUM OXIDE PO Take 5 mLs by mouth 2 (two) times daily.   Past Week at Unknown time  . Melatonin 10 MG TABS Take 10 mg by mouth at bedtime as needed (sleep).   Past Week at Unknown time  . OVER THE COUNTER MEDICATION Take 30 drops by mouth 2 (two) times daily. Teakana Kit   Past Week at Unknown time  . OVER THE COUNTER MEDICATION Take 1 drop by mouth 2 (two) times daily. BBVII Oil  Past Week at Unknown time  . OVER THE COUNTER MEDICATION Take 1 capsule by mouth daily. Curgard   Past Week at Unknown time  . OVER THE COUNTER MEDICATION Take 1 capsule by mouth daily. Stephania Root   Past Week at Unknown time  . valACYclovir (VALTREX) 500 MG tablet Take 1 tablet (500 mg total) by mouth 2 (two) times daily. 60 tablet 12 02/01/2018 at 0600  . VITAMIN K PO Take 1 capsule by mouth daily.   Past Week at Unknown time  . Zinc Acetate, Oral, (ZINC ACETATE PO) Take 1 capsule by mouth daily.   Past Week at Unknown time  . albuterol (PROVENTIL HFA;VENTOLIN HFA) 108 (90 Base) MCG/ACT inhaler Inhale 2 puffs every 6 (six) hours as needed into the lungs for wheezing or shortness of breath. 1 Inhaler 0 More than a month at Unknown time   Allergies   Allergen Reactions  . Acyclovir And Related Nausea Only and Other (See Comments)    Abdominal pain dizziness.  [name] [Tinidazole]     UNSPECIFIED REACTION   . Sertraline Hcl Other (See Comments)    Paranoia.     Social History   Tobacco Use  . Smoking status: Former Research scientist (life sciences)  . Smokeless tobacco: Former Systems developer    Quit date: 09/17/2003  Substance Use Topics  . Alcohol use: No    Family History  Problem Relation Age of Onset  . Hypertension Mother   . Breast cancer Maternal Aunt   . Prostate cancer Maternal Uncle   . Hypertension Maternal Grandmother   . Heart disease Maternal Grandmother   . Lung cancer Maternal Grandfather      Review of Systems Pertinent items noted in HPI and remainder of comprehensive ROS otherwise negative.  Objective: Vital signs in last 24 hours: Temp:  [97.8 F (36.6 C)] 97.8 F (36.6 C) (08/07 0713) Pulse Rate:  [54] 54 (08/07 0713) Resp:  [20] 20 (08/07 0713) BP: (113)/(62) 113/62 (08/07 0713) SpO2:  [100 %] 100 % (08/07 0713) Weight:  [58.5 kg (129 lb)] 58.5 kg (129 lb) (08/07 0713)  EXAM: Patient well-developed well-nourished white female in no acute distress.   Lungs are clear to auscultation , the patient has symmetrical respiratory excursion. Heart has a regular rate and rhythm normal S1 and S2 no murmur.   Abdomen is soft nontender nondistended bowel sounds are present. Extremity examination shows no clubbing cyanosis or edema. Motor examination shows 5 over 5 strength in the upper extremities including the deltoid biceps triceps and intrinsics and grip. Sensation is intact to pinprick throughout the digits of the upper extremities. Reflexes are symmetrical and without evidence of pathologic reflexes. Patient has a normal gait and stance.   Data Review:CBC    Component Value Date/Time   WBC 4.4 01/24/2018 1037   RBC 4.39 01/24/2018 1037   HGB 14.0 01/24/2018 1037   HCT 43.8 01/24/2018 1037   PLT 186 01/24/2018 1037   MCV 99.8  01/24/2018 1037   MCH 31.9 01/24/2018 1037   MCHC 32.0 01/24/2018 1037   RDW 12.1 01/24/2018 1037   LYMPHSABS 1.2 09/19/2017 0840   MONOABS 0.3 09/19/2017 0840   EOSABS 0.2 09/19/2017 0840   BASOSABS 0.1 09/19/2017 0840                          BMET    Component Value Date/Time   NA 137 01/24/2018 1037   NA 140 08/11/2016   K 4.0  01/24/2018 1037   CL 104 01/24/2018 1037   CO2 23 01/24/2018 1037   GLUCOSE 92 01/24/2018 1037   BUN 11 01/24/2018 1037   BUN 6 08/11/2016   CREATININE 0.72 01/24/2018 1037   CALCIUM 9.6 01/24/2018 1037   GFRNONAA >60 01/24/2018 1037   GFRAA >60 01/24/2018 1037     Assessment/Plan: Patient with bilateral cervical radiculopathy with cervical disc herniations at C5-6 and C6-7 who is admitted for two-level C5-6 and C6-7 ACDF.  I've discussed with the patient the nature of his condition, the nature the surgical procedure, the typical length of surgery, hospital stay, and overall recuperation. We discussed limitations postoperatively. I discussed risks of surgery including risks of infection, bleeding, possibly need for transfusion, the risk of nerve root dysfunction with pain, weakness, numbness, or paresthesias, the risk of spinal cord dysfunction with paralysis of all 4 limbs and quadriplegia, and the risk of dural tear and CSF leakage and possible need for further surgery, the risk of esophageal dysfunction causing dysphagia and the risk of laryngeal dysfunction causing hoarseness of the voice, the risk of failure of the arthrodesis and the possible need for further surgery, and the risk of anesthetic complications including myocardial infarction, stroke, pneumonia, and death. We also discussed the need for postoperative immobilization in a cervical collar. Understanding all this the patient does wish to proceed with surgery and is admitted for such.    Hosie Spangle, MD 02/01/2018 8:19 AM

## 2018-02-01 NOTE — Discharge Instructions (Signed)

## 2018-02-01 NOTE — Transfer of Care (Signed)
Immediate Anesthesia Transfer of Care Note  Patient: Kristie [name]  Procedure(s) Performed: Anterior cervical decompression/discectomy/fusion Cervical five-six Cervical six-seven (N/A Spine Cervical)  Patient Location: PACU  Anesthesia Type:General  Level of Consciousness: awake, alert , oriented and patient cooperative  Airway & Oxygen Therapy: Patient Spontanous Breathing and Patient connected to nasal cannula oxygen  Post-op Assessment: Report given to RN and Post -op Vital signs reviewed and stable  Post vital signs: Reviewed and stable  Last Vitals:  Vitals Value Taken Time  BP 100/60 02/01/2018 11:15 AM  Temp    Pulse 60 02/01/2018 11:15 AM  Resp 12 02/01/2018 11:15 AM  SpO2 100 % 02/01/2018 11:15 AM  Vitals shown include unvalidated device data.  Last Pain:  Vitals:   02/01/18 0721  TempSrc:   PainSc: 4       Patients Stated Pain Goal: 3 (29/24/46 2863)  Complications: No apparent anesthesia complications

## 2018-02-01 NOTE — Anesthesia Preprocedure Evaluation (Addendum)
Anesthesia Evaluation  Patient identified by MRN, date of birth, ID band Patient awake    Reviewed: Allergy & Precautions, NPO status , Patient's Chart, lab work & pertinent test results  History of Anesthesia Complications Negative for: history of anesthetic complications  Airway Mallampati: I  TM Distance: >3 FB Neck ROM: Full    Dental  (+) Dental Advisory Given   Pulmonary asthma , former smoker,    breath sounds clear to auscultation       Cardiovascular negative cardio ROS   Rhythm:Regular     Neuro/Psych  Headaches, PSYCHIATRIC DISORDERS Anxiety    GI/Hepatic negative GI ROS, Neg liver ROS,   Endo/Other  negative endocrine ROS  Renal/GU negative Renal ROS     Musculoskeletal   Abdominal   Peds  Hematology negative hematology ROS (+)   Anesthesia Other Findings   Reproductive/Obstetrics                            Anesthesia Physical Anesthesia Plan  ASA: II  Anesthesia Plan: General   Post-op Pain Management:    Induction: Intravenous  PONV Risk Score and Plan: 3 and Ondansetron, Dexamethasone and Midazolam  Airway Management Planned: Oral ETT  Additional Equipment: None  Intra-op Plan:   Post-operative Plan: Extubation in OR  Informed Consent: I have reviewed the patients History and Physical, chart, labs and discussed the procedure including the risks, benefits and alternatives for the proposed anesthesia with the patient or authorized representative who has indicated his/her understanding and acceptance.   Dental advisory given  Plan Discussed with: CRNA and Surgeon  Anesthesia Plan Comments:         Anesthesia Quick Evaluation

## 2018-02-02 ENCOUNTER — Encounter (HOSPITAL_COMMUNITY): Payer: Self-pay | Admitting: Neurosurgery

## 2018-02-02 MED FILL — Thrombin (Recombinant) For Soln 20000 Unit: CUTANEOUS | Qty: 1 | Status: AC

## 2018-02-03 NOTE — Anesthesia Postprocedure Evaluation (Signed)
Anesthesia Post Note  Patient: [name]  Procedure(s) Performed: Anterior cervical decompression/discectomy/fusion Cervical five-six Cervical six-seven (N/A Spine Cervical)     Patient location during evaluation: PACU Anesthesia Type: General Level of consciousness: awake and alert Pain management: pain level controlled Vital Signs Assessment: post-procedure vital signs reviewed and stable Respiratory status: spontaneous breathing, nonlabored ventilation, respiratory function stable and patient connected to nasal cannula oxygen Cardiovascular status: blood pressure returned to baseline and stable Postop Assessment: no apparent nausea or vomiting Anesthetic complications: no    Last Vitals:  Vitals:   02/01/18 1250 02/01/18 1632  BP: (!) 106/59 106/70  Pulse: (!) 52 (!) 49  Resp: 18 16  Temp: 36.6 C 36.5 C  SpO2: 100% 100%    Last Pain:  Vitals:   02/01/18 1859  TempSrc:   PainSc: 3                  Starnisha Batrez

## 2018-02-06 DIAGNOSIS — M4722 Other spondylosis with radiculopathy, cervical region: Secondary | ICD-10-CM | POA: Diagnosis not present

## 2018-02-06 DIAGNOSIS — M542 Cervicalgia: Secondary | ICD-10-CM | POA: Diagnosis not present

## 2018-02-06 DIAGNOSIS — Z981 Arthrodesis status: Secondary | ICD-10-CM | POA: Diagnosis not present

## 2018-02-06 DIAGNOSIS — M5030 Other cervical disc degeneration, unspecified cervical region: Secondary | ICD-10-CM | POA: Diagnosis not present

## 2018-02-17 DIAGNOSIS — M542 Cervicalgia: Secondary | ICD-10-CM | POA: Diagnosis not present

## 2018-02-17 DIAGNOSIS — M5412 Radiculopathy, cervical region: Secondary | ICD-10-CM | POA: Diagnosis not present

## 2018-02-17 DIAGNOSIS — R030 Elevated blood-pressure reading, without diagnosis of hypertension: Secondary | ICD-10-CM | POA: Diagnosis not present

## 2018-02-17 DIAGNOSIS — Z981 Arthrodesis status: Secondary | ICD-10-CM | POA: Diagnosis not present

## 2018-02-17 MED FILL — GABAPENTIN 300 MG CAPSULE: 300 | 30 days supply | Qty: 90 | Fill #0

## 2018-03-02 MED FILL — FLUCONAZOLE 150 MG TABS: 150 | 3 days supply | Qty: 2 | Fill #0

## 2018-03-26 NOTE — Progress Notes (Signed)
 Kristie [name] is a 37 y.o. female is here for follow up.  History of Present Illness:   Kristie [name] CMA acting as scribe for Dr. Wallace.  HPI: Patient comes in today for her 6 month follow up. S/p two-level C5-6 and C6-7 anterior cervical decompression arthrodesis with structural allograft and cervical plating. Pain previously radiating to thumb and pointer finger. Now, pain and paresthesias to pinky fingers. She is worried that there have been changes to lower cervical level. Have to wait until month three to get another MRI. [place] following.  She is frustrated that she is dealing with medical issues when she has been so healthy for most of her life. Seeing therapist.  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  There are no preventive care reminders to display for this patient.   Depression screen PHQ 2/9 02/10/2016  Decreased Interest 0  Down, Depressed, Hopeless 0  PHQ - 2 Score 0   PMHx, SurgHx, SocialHx, FamHx, Medications, and Allergies were reviewed in the Visit Navigator and updated as appropriate.   Patient Active Problem List   Diagnosis Date Noted  . HNP (herniated nucleus pulposus), cervical 02/01/2018  . Thoracic outlet syndrome 09/07/2017  . Dysesthesia of multiple sites 09/07/2017  . Thoracic back pain 02/18/2017  . Congenital cavus foot 02/18/2017  . History of cervical dysplasia 01/20/2017  . BV (bacterial vaginosis) 12/21/2016  . HSV-1 (herpes simplex virus 1) infection 07/08/2015  . Migraines 08/05/2011  . Allergic rhinitis 06/12/2010  . Asthma 06/05/2010   Social History   Tobacco Use  . Smoking status: Former Smoker  . Smokeless tobacco: Former User    Quit date: 09/17/2003  Substance Use Topics  . Alcohol use: No  . Drug use: No   Current Medications and Allergies:   Current Outpatient Medications:  .  Acetylcysteine (NAC 600) 600 MG CAPS, Take 600 mg by mouth daily., Disp: ,  Rfl:  .  albuterol (PROVENTIL HFA;VENTOLIN HFA) 108 (90 Base) MCG/ACT inhaler, Inhale 2 puffs every 6 (six) hours as needed into the lungs for wheezing or shortness of breath., Disp: 1 Inhaler, Rfl: 0 .  Ascorbic Acid (VITAMIN C PO), Take 560 mg by mouth 2 (two) times daily., Disp: , Rfl:  .  ASHWAGANDHA PO, Take 450 mg by mouth daily., Disp: , Rfl:  .  [place] POWD, Take 5 mLs by mouth daily., Disp: , Rfl:  .  Black Currant Seed Oil 500 MG CAPS, Take 500 mg by mouth 2 (two) times daily., Disp: , Rfl:  .  Boswellia Serrata (BOSWELLIA PO), Take 1 capsule by mouth daily., Disp: , Rfl:  .  Charcoal Activated (CHARCOAL PO), Take 1,120 mg by mouth 2 (two) times daily., Disp: , Rfl:  .  Cholecalciferol (VITAMIN D PO), Take 1 capsule by mouth daily., Disp: , Rfl:  .  cholestyramine (QUESTRAN) 4 GM/DOSE powder, Take 1 g by mouth 2 (two) times daily with a meal. , Disp: , Rfl:  .  Creatine POWD, Take 1 Scoop by mouth daily., Disp: , Rfl:  .  diazepam (VALIUM) 5 MG tablet, Take 1 tablet (5 mg total) by mouth every 12 (twelve) hours as needed for anxiety., Disp: 20 tablet, Rfl: 1 .  MAGNESIUM OXIDE PO, Take 5 mLs by mouth 2 (two) times daily., Disp: , Rfl:  .  Melatonin 10 MG TABS, Take 10 mg by mouth at bedtime as needed (sleep)., Disp: , Rfl:  .    OVER THE COUNTER MEDICATION, Take 30 drops by mouth 2 (two) times daily. [place]ana Kit, Disp: , Rfl:  .  OVER THE COUNTER MEDICATION, Take 1 drop by mouth 2 (two) times daily. BBVII Oil, Disp: , Rfl:  .  OVER THE COUNTER MEDICATION, Take 1 capsule by mouth daily. Curgard, Disp: , Rfl:  .  OVER THE COUNTER MEDICATION, Take 1 capsule by mouth daily. Stephania Root, Disp: , Rfl:  .  valACYclovir (VALTREX) 500 MG tablet, Take 1 tablet (500 mg total) by mouth 2 (two) times daily., Disp: 60 tablet, Rfl: 12 .  VITAMIN K PO, Take 1 capsule by mouth daily., Disp: , Rfl:  .  Zinc Acetate, Oral, (ZINC ACETATE PO), Take 1 capsule by mouth daily., Disp: , Rfl:  .  baclofen  (LIORESAL) 10 MG tablet, Take 1 tablet (10 mg total) by mouth 3 (three) times daily., Disp: 30 each, Rfl: 0 .  ketorolac (TORADOL) 10 MG tablet, Take 1 tablet (10 mg total) by mouth every 6 (six) hours as needed., Disp: 20 tablet, Rfl: 0   Allergies  Allergen Reactions  . Acyclovir And Related Nausea Only and Other (See Comments)    Abdominal pain dizziness.  [name] [Tinidazole]     UNSPECIFIED REACTION   . Sertraline Hcl Other (See Comments)    Paranoia.    Review of Systems   Pertinent items are noted in the HPI. Otherwise, ROS is negative.  Vitals:   Vitals:   03/27/18 0819  BP: 108/64  Pulse: (!) 51  Temp: 98.1 F (36.7 C)  TempSrc: Oral  SpO2: 98%  Weight: 128 lb 9.6 oz (58.3 kg)  Height: 5' 6" (1.676 m)     Body mass index is 20.76 kg/m.  Physical Exam:   Physical Exam  Constitutional: She appears well-developed and well-nourished. No distress.  HENT:  Head: Normocephalic and atraumatic.  Eyes: Pupils are equal, round, and reactive to light. EOM are normal.  Neck: Normal range of motion. Neck supple.  Cardiovascular: Normal rate, regular rhythm, normal heart sounds and intact distal pulses.  Pulmonary/Chest: Effort normal.  Abdominal: Soft.  Skin: Skin is warm.  Psychiatric: She has a normal mood and affect. Her behavior is normal.  Nursing note and vitals reviewed.  Assessment and Plan:   Kristie [name] was seen today for follow-up.  Diagnoses and all orders for this visit:  Radiculitis of right cervical region -     Ambulatory referral to Physical Therapy -     ketorolac (TORADOL) 10 MG tablet; Take 1 tablet (10 mg total) by mouth every 6 (six) hours as needed. -     baclofen (LIORESAL) 10 MG tablet; Take 1 tablet (10 mg total) by mouth 3 (three) times daily.    . Reviewed expectations re: course of current medical issues. . Discussed self-management of symptoms. . Outlined signs and symptoms indicating need for more acute intervention. . Patient  verbalized understanding and all questions were answered. Marland Kitchen Health Maintenance issues including appropriate healthy diet, exercise, and smoking avoidance were discussed with patient. . See orders for this visit as documented in the electronic medical record. . Patient received an After Visit Summary.  CMA served as Education administrator during this visit. History, Physical, and Plan performed by medical provider. The above documentation has been reviewed and is accurate and complete. Briscoe Deutscher, D.O.  Briscoe Deutscher, DO [place], [facility] 03/27/2018

## 2018-03-27 ENCOUNTER — Ambulatory Visit (INDEPENDENT_AMBULATORY_CARE_PROVIDER_SITE_OTHER): Payer: 59 | Admitting: Family Medicine

## 2018-03-27 ENCOUNTER — Encounter: Payer: Self-pay | Admitting: Family Medicine

## 2018-03-27 VITALS — BP 108/64 | HR 51 | Temp 98.1°F | Ht 66.0 in | Wt 128.6 lb

## 2018-03-27 DIAGNOSIS — M5412 Radiculopathy, cervical region: Secondary | ICD-10-CM

## 2018-03-27 MED ORDER — BACLOFEN 10 MG PO TABS: 10.0000 mg | ORAL_TABLET | Freq: Three times a day (TID) | ORAL | 0 refills | Status: DC

## 2018-03-27 MED ORDER — KETOROLAC TROMETHAMINE 10 MG PO TABS: 10.0000 mg | ORAL_TABLET | Freq: Four times a day (QID) | ORAL | 0 refills | Status: DC | PRN

## 2018-03-27 MED FILL — BACLOFEN 10 MG TABLET: 10 | 10 days supply | Qty: 30 | Fill #0

## 2018-03-27 MED FILL — KETOROLAC 10 MG TABLET: 10 | 5 days supply | Qty: 20 | Fill #0

## 2018-03-28 ENCOUNTER — Encounter: Payer: Self-pay | Admitting: Family Medicine

## 2018-03-29 ENCOUNTER — Ambulatory Visit (INDEPENDENT_AMBULATORY_CARE_PROVIDER_SITE_OTHER): Payer: 59 | Admitting: Physical Therapy

## 2018-03-29 ENCOUNTER — Encounter: Payer: Self-pay | Admitting: Physical Therapy

## 2018-03-29 DIAGNOSIS — M62830 Muscle spasm of back: Secondary | ICD-10-CM

## 2018-03-29 DIAGNOSIS — M542 Cervicalgia: Secondary | ICD-10-CM | POA: Diagnosis not present

## 2018-03-29 DIAGNOSIS — M5412 Radiculopathy, cervical region: Secondary | ICD-10-CM | POA: Diagnosis not present

## 2018-03-29 NOTE — Patient Instructions (Signed)
Access Code: [email or web address]  URL: [email or web address]  Date: 03/29/2018  Prepared by: [name]   Exercises  Seated Cervical Retraction - 10 reps - 2 sets - 2x daily  Seated Cervical Rotation AROM - 10 reps - 2 sets - 2x daily  Seated Shoulder Rolls - 10 reps - 2 sets - 2x daily  Standing Scapular Retraction - 10 reps - 2 sets - 2x daily  Seated Cervical Sidebending Stretch - 3 sets - 30 hold - 2x daily

## 2018-03-29 NOTE — Therapy (Signed)
[facility] [address] [place], [place], [address] Phone: [phone]   Fax:  [phone]  Physical Therapy Evaluation  Patient Details  Name: Kristie [name] MRN: [id number] Date of Birth: March 17, 1981 Referring Provider (PT): Briscoe Deutscher   Encounter Date: 03/29/2018  PT End of Session - 03/29/18 1005    Visit Number  1    Number of Visits  12    Date for PT Re-Evaluation  05/10/18    Authorization Type  [facility]    PT Start Time  0850    PT Stop Time  0935    PT Time Calculation (min)  45 min    Activity Tolerance  Patient tolerated treatment well;Patient limited by pain    Behavior During Therapy  [facility] for tasks assessed/performed       Past Medical History:  Diagnosis Date  . Anxiety   . Asthma, exercise induced   . Complication of anesthesia    Very sensitive to medicine  . Dysplasia of cervix, high grade CIN 2   . HNP (herniated nucleus pulposus) with myelopathy, cervical   . HPV in female   . Migraines   . Seasonal allergies     Past Surgical History:  Procedure Laterality Date  . ANTERIOR CERVICAL DECOMP/DISCECTOMY FUSION N/A 02/01/2018   Procedure: Anterior cervical decompression/discectomy/fusion Cervical five-six Cervical six-seven;  Surgeon: Jovita Gamma, MD;  Location: [place];  Service: Neurosurgery;  Laterality: N/A;  . LASER ABLATION OF THE CERVIX  2013   CIN-2  . VAGINAL CYST EXCISED  2001    There were no vitals filed for this visit.   Subjective Assessment - 03/29/18 0851    Subjective  Pt had ACDF C5-7 on 02/01/18 by Dr. Sherwood Gambler.  She states initial pain relief, but a week later, had increased pain into L and R pinky fingers. Now has had progressively worsening symptoms on R vs L, tingling and burning pain into UEs. She also states significant tightness in neck and upper back muscles. Pt also notes increased pain in mid back as well yesterday. She previously was very active, cycling, currently is  able to work at desk job, Marine scientist, but is very frustrated with fact that she is having significant pain and symptoms that have continued. Pt eager to have pain relief and return to activity.      Limitations  Sitting;Reading;Lifting;Standing;Walking;Writing;House hold activities    Patient Stated Goals  Decreased pain in neck and UEs     Currently in Pain?  Yes    Pain Score  8     Pain Location  Arm    Pain Orientation  Right    Pain Descriptors / Indicators  Tingling;Burning;Aching    Pain Type  Acute pain    Pain Onset  More than a month ago    Pain Frequency  Intermittent    Aggravating Factors   none stated    Pain Relieving Factors  heat/ice    Multiple Pain Sites  Yes    Pain Score  2    Pain Location  Arm    Pain Orientation  Left    Pain Descriptors / Indicators  Aching;Burning;Tingling    Pain Type  Acute pain    Pain Onset  More than a month ago    Pain Frequency  Intermittent    Aggravating Factors   none stated    Pain Relieving Factors  heat/ice         [facility] PT Assessment - 03/29/18  0001      Assessment   Medical Diagnosis  Cervical radiculopathy    Referring Provider (PT)  Briscoe Deutscher    Onset Date/Surgical Date  02/01/18    Hand Dominance  Right    Next MD Visit  2 weeks    Prior Therapy  Not after surgery      Precautions   Precautions  Cervical      Balance Screen   Has the patient fallen in the past 6 months  No      Prior Function   Level of Independence  Independent      Cognition   Overall Cognitive Status  Within Functional Limits for tasks assessed      Posture/Postural Control   Posture Comments  Stiff upper body/cervical-thoracic region;       ROM / Strength   AROM / PROM / Strength  AROM;Strength      AROM   Overall AROM Comments  Cervical flexion: moderate/significant limitation/pain;  Extension: moderate limitation;  L rot: mild limitation:  R rotation: WNL;   Shoulders: WNL;       Strength   Overall Strength Comments  Shoulder:  4+/5,  Scapular: 4/5;  Grip strength: L; 58;  R: 60;         Palpation   Palpation comment  Increased tightness and tenderness in Bil cervical paraspinals, UT, rhomboids, Painful trigger points in R levator;  Well healed incision at anterior neck;  Hypomobile cervical flexion, extension, mild limtation for L rotation       Special Tests   Other special tests  Distraction not tested, due to post op ACDF;                 Objective measurements completed on examination: See above findings.      [facility] Adult PT Treatment/Exercise - 03/29/18 0001      Exercises   Exercises  Neck      Neck Exercises: Seated   Neck Retraction  20 reps    Shoulder Rolls  10 reps    Other Seated Exercise  Scap retract x10;       Neck Exercises: Supine   Cervical Rotation  5 reps      Neck Exercises: Stretches   Upper Trapezius Stretch  2 reps;30 seconds             PT Education - 03/29/18 1003    Education Details  HEP, PT POC    Person(s) Educated  Patient    Methods  Explanation;Demonstration;Handout;Verbal cues    Comprehension  Verbalized understanding;Verbal cues required;Returned demonstration;Tactile cues required;Need further instruction       PT Short Term Goals - 03/29/18 1208      PT SHORT TERM GOAL #1   Title  Pt to report decreased pain in R cervical region and UE, to 5/10     Time  2    Period  Weeks    Status  New    Target Date  04/12/18      PT SHORT TERM GOAL #2   Title  Pt to be independent with initial HEP     Time  2    Period  Weeks    Status  New    Target Date  04/12/18        PT Long Term Goals - 03/29/18 1209      PT LONG TERM GOAL #1   Title  Pt to report decreased pain in bil cervical and UE  region to 2/10 with activity     Time  6    Period  Weeks    Status  New    Target Date  05/10/18      PT LONG TERM GOAL #2   Title  Pt to demo improved cervical flexion ROM, to have only mild to no limitation, and no pain    Time  6    Period   Weeks    Status  New    Target Date  05/10/18      PT LONG TERM GOAL #3   Title  Pt to be independent with final HEP for shoulder and cervical strengthening.     Time  6    Period  Weeks    Status  New    Target Date  05/10/18      PT LONG TERM GOAL #4   Title  Pt to demo improved muscle tension at cervical region, to be WNL, no pain to palpate cervical and upper back musculature    Time  6    Period  Weeks    Status  New    Target Date  05/10/18             Plan - 03/29/18 1212    Clinical Impression Statement  Pt presents with primary complaint of increased pain in cervical region, and into bil UE R>L. Pt also is s/p ACDF C5-7 on 02/01/18. She reports increased symptoms on R since surgery, and has some concern for further cervical involvement, but is required to wait until she is able to have another MRI. Pt referred for management of her current symptoms. She has significant pain, tightness and tenderness in cervcial and upper back musculature. She does have ROM limitations for c-spine, mostly for flexion. She has bothersome tingling into bil UE, R>L. Pt with stiff upper body posture, likley due to post surgical status. She has good ROM and strength in shoulders, and good preservation of grip strength bilaterally. Pt currently with decreased ability for full functional activities, due to pain and defiicts. Pt to benefit from skilled PT , to improve pain, muscle tension, and ROM of cervical region.     Clinical Presentation  Evolving    Clinical Decision Making  Low    Rehab Potential  Fair    PT Frequency  2x / week    PT Duration  6 weeks    PT Treatment/Interventions  ADLs/Self Care Home Management;Cryotherapy;Electrical Stimulation;Iontophoresis 4mg /ml Dexamethasone;Functional mobility training;Ultrasound;Moist Heat;Therapeutic activities;Therapeutic exercise;Neuromuscular re-education;Patient/family education;Passive range of motion;Manual techniques;Dry needling;Taping     Consulted and Agree with Plan of Care  Patient       Patient will benefit from skilled therapeutic intervention in order to improve the following deficits and impairments:  Decreased range of motion, Increased muscle spasms, Decreased activity tolerance, Pain, Improper body mechanics, Impaired flexibility, Hypomobility, Decreased mobility, Decreased strength  Visit Diagnosis: Cervicalgia  Right cervical radiculopathy  Left cervical radiculopathy  Muscle spasm of back     Problem List Patient Active Problem List   Diagnosis Date Noted  . HNP (herniated nucleus pulposus), cervical 02/01/2018  . Thoracic outlet syndrome 09/07/2017  . Dysesthesia of multiple sites 09/07/2017  . Thoracic back pain 02/18/2017  . Congenital cavus foot 02/18/2017  . History of cervical dysplasia 01/20/2017  . BV (bacterial vaginosis) 12/21/2016  . HSV-1 (herpes simplex virus 1) infection 07/08/2015  . Migraines 08/05/2011  . Allergic rhinitis 06/12/2010  . Asthma 06/05/2010    [name],  PT, DPT 12:31 PM  03/29/18    [place], [place], [address] Phone: [phone]   Fax:  [phone]  Name: CARMEN TOLLIVER MRN: [id number] Date of Birth: [birth year]

## 2018-04-04 ENCOUNTER — Ambulatory Visit (INDEPENDENT_AMBULATORY_CARE_PROVIDER_SITE_OTHER): Payer: 59 | Admitting: Physical Therapy

## 2018-04-04 DIAGNOSIS — M62830 Muscle spasm of back: Secondary | ICD-10-CM

## 2018-04-04 DIAGNOSIS — M5412 Radiculopathy, cervical region: Secondary | ICD-10-CM

## 2018-04-04 DIAGNOSIS — M542 Cervicalgia: Secondary | ICD-10-CM | POA: Diagnosis not present

## 2018-04-05 ENCOUNTER — Encounter: Payer: Self-pay | Admitting: Physical Therapy

## 2018-04-05 ENCOUNTER — Ambulatory Visit (INDEPENDENT_AMBULATORY_CARE_PROVIDER_SITE_OTHER): Payer: 59 | Admitting: Physical Therapy

## 2018-04-05 DIAGNOSIS — M62830 Muscle spasm of back: Secondary | ICD-10-CM

## 2018-04-05 DIAGNOSIS — M542 Cervicalgia: Secondary | ICD-10-CM | POA: Diagnosis not present

## 2018-04-05 DIAGNOSIS — M5412 Radiculopathy, cervical region: Secondary | ICD-10-CM | POA: Diagnosis not present

## 2018-04-05 NOTE — Therapy (Signed)
[facility] [address] [place], [place], [address] Phone: [phone]   Fax:  [phone]  Physical Therapy Treatment  Patient Details  Name: Kristie [name] MRN: [id number] Date of Birth: 07/16/1980 Referring Provider (PT): Briscoe Deutscher   Encounter Date: 04/04/2018  PT End of Session - 04/05/18 0841    Visit Number  2    Number of Visits  12    Date for PT Re-Evaluation  05/10/18    Authorization Type  [employer]    PT Start Time  1602    PT Stop Time  1647    PT Time Calculation (min)  45 min    Activity Tolerance  Patient tolerated treatment well;Patient limited by pain    Behavior During Therapy  [facility] for tasks assessed/performed       Past Medical History:  Diagnosis Date  . Anxiety   . Asthma, exercise induced   . Complication of anesthesia    Very sensitive to medicine  . Dysplasia of cervix, high grade CIN 2   . HNP (herniated nucleus pulposus) with myelopathy, cervical   . HPV in female   . Migraines   . Seasonal allergies     Past Surgical History:  Procedure Laterality Date  . ANTERIOR CERVICAL DECOMP/DISCECTOMY FUSION N/A 02/01/2018   Procedure: Anterior cervical decompression/discectomy/fusion Cervical five-six Cervical six-seven;  Surgeon: Jovita Gamma, MD;  Location: [place];  Service: Neurosurgery;  Laterality: N/A;  . LASER ABLATION OF THE CERVIX  2013   CIN-2  . VAGINAL CYST EXCISED  2001    There were no vitals filed for this visit.  Subjective Assessment - 04/05/18 0838    Subjective  Pt called MD office, discussed and okayed dry needling. Pt states decreased symptoms in UE today vs last week. She states significant "tightness" in neck.     Limitations  Sitting;Reading;Lifting;Standing;Walking;House hold activities;Writing    Patient Stated Goals  Decreased pain in neck and UEs     Currently in Pain?  Yes    Pain Score  4     Pain Location  Arm    Pain Orientation  Right    Pain Descriptors /  Indicators  Burning;Tingling;Aching    Pain Type  Acute pain    Pain Onset  More than a month ago    Pain Frequency  Intermittent    Pain Score  3    Pain Location  Neck    Pain Orientation  Left;Right    Pain Descriptors / Indicators  Aching;Tightness    Pain Type  Acute pain    Pain Onset  More than a month ago    Pain Frequency  Intermittent                       [facility] Adult PT Treatment/Exercise - 04/05/18 0001      Exercises   Exercises  Neck      Neck Exercises: Seated   Neck Retraction  20 reps      Manual Therapy   Manual Therapy  Soft tissue mobilization;Passive ROM    Manual therapy comments  skilled palpation and monitoring of soft tissue during dry needling    Soft tissue mobilization  STM/DTM to Bil UT and cervical paraspinals, R levator, sub occipitals bil;     Passive ROM  All motions; Manual UT stretching, R shoulder PROM, all motions, R UE distraction;       Neck Exercises: Stretches   Upper Trapezius  Stretch  3 reps;30 seconds    Other Neck Stretches  Supine pec stretch with nerve glides x20;  Seated Ulnar nerve glide x20;        Trigger Point Dry Needling - 04/05/18 0837    Consent Given?  Yes    Education Handout Provided  Yes    Muscles Treated Upper Body  Upper trapezius;Levator scapulae    Upper Trapezius Response  Twitch reponse elicited;Palpable increased muscle length   bilateral   Levator Scapulae Response  Twitch response elicited;Palpable increased muscle length   Right            PT Short Term Goals - 03/29/18 1208      PT SHORT TERM GOAL #1   Title  Pt to report decreased pain in R cervical region and UE, to 5/10     Time  2    Period  Weeks    Status  New    Target Date  04/12/18      PT SHORT TERM GOAL #2   Title  Pt to be independent with initial HEP     Time  2    Period  Weeks    Status  New    Target Date  04/12/18        PT Long Term Goals - 03/29/18 1209      PT LONG TERM GOAL #1   Title  Pt  to report decreased pain in bil cervical and UE region to 2/10 with activity     Time  6    Period  Weeks    Status  New    Target Date  05/10/18      PT LONG TERM GOAL #2   Title  Pt to demo improved cervical flexion ROM, to have only mild to no limitation, and no pain    Time  6    Period  Weeks    Status  New    Target Date  05/10/18      PT LONG TERM GOAL #3   Title  Pt to be independent with final HEP for shoulder and cervical strengthening.     Time  6    Period  Weeks    Status  New    Target Date  05/10/18      PT LONG TERM GOAL #4   Title  Pt to demo improved muscle tension at cervical region, to be WNL, no pain to palpate cervical and upper back musculature    Time  6    Period  Weeks    Status  New    Target Date  05/10/18            Plan - 04/05/18 0842    Clinical Impression Statement  Pt with increased tightness and tenderness in most cervical musculature, addressed with dry needling and manual therapy today. Pt educated on UE nerve glides as well. Pt with good response to dry needling, may require additional sessions for pain/tightness. Patient Consent: After explanation of Trigger Point Dry Needling (TDN)  rationale, procedures, outcomes, and potential side effects, patient verbalized consent to TDN treatment. Post treatment instructions: Patient instructed to expect mild to moderate muscle soreness this evening and tomorrow. Pt instructed to continue prescribed home exercise program. If dry needling over lung field, patient instructed of signs and symptoms of pneumothorax. Although unlikely, patient educated on seeking immediate medical attention, should symptoms occur. Pt verbalized understanding of these instructions.     Rehab Potential  Fair    PT Frequency  2x / week    PT Duration  6 weeks    PT Treatment/Interventions  ADLs/Self Care Home Management;Cryotherapy;Electrical Stimulation;Iontophoresis 4mg /ml Dexamethasone;Functional mobility  training;Ultrasound;Moist Heat;Therapeutic activities;Therapeutic exercise;Neuromuscular re-education;Patient/family education;Passive range of motion;Manual techniques;Dry needling;Taping    Consulted and Agree with Plan of Care  Patient       Patient will benefit from skilled therapeutic intervention in order to improve the following deficits and impairments:  Decreased range of motion, Increased muscle spasms, Decreased activity tolerance, Pain, Improper body mechanics, Impaired flexibility, Hypomobility, Decreased mobility, Decreased strength  Visit Diagnosis: Cervicalgia  Right cervical radiculopathy  Left cervical radiculopathy  Muscle spasm of back     Problem List Patient Active Problem List   Diagnosis Date Noted  . HNP (herniated nucleus pulposus), cervical 02/01/2018  . Thoracic outlet syndrome 09/07/2017  . Dysesthesia of multiple sites 09/07/2017  . Thoracic back pain 02/18/2017  . Congenital cavus foot 02/18/2017  . History of cervical dysplasia 01/20/2017  . BV (bacterial vaginosis) 12/21/2016  . HSV-1 (herpes simplex virus 1) infection 07/08/2015  . Migraines 08/05/2011  . Allergic rhinitis 06/12/2010  . Asthma 06/05/2010    [name], PT, DPT 8:44 AM  04/05/18    [facility] [place], [place], [address] Phone: [phone]   Fax:  [phone]  Name: Kristie [name] MRN: [id number] Date of Birth: 11-Jul-1980

## 2018-04-05 NOTE — Therapy (Signed)
[facility] [address] [place], [place], [address] Phone: [phone]   Fax:  [phone]  Physical Therapy Treatment  Patient Details  Name: Kristie [name] MRN: [id number] Date of Birth: 1980-12-19 Referring Provider (PT): Briscoe Deutscher   Encounter Date: 04/05/2018  PT End of Session - 04/05/18 1610    Visit Number  3    Number of Visits  12    Date for PT Re-Evaluation  05/10/18    Authorization Type  [employer]    PT Start Time  1430    PT Stop Time  1516    PT Time Calculation (min)  46 min    Activity Tolerance  Patient tolerated treatment well;Patient limited by pain    Behavior During Therapy  [facility] for tasks assessed/performed       Past Medical History:  Diagnosis Date  . Anxiety   . Asthma, exercise induced   . Complication of anesthesia    Very sensitive to medicine  . Dysplasia of cervix, high grade CIN 2   . HNP (herniated nucleus pulposus) with myelopathy, cervical   . HPV in female   . Migraines   . Seasonal allergies     Past Surgical History:  Procedure Laterality Date  . ANTERIOR CERVICAL DECOMP/DISCECTOMY FUSION N/A 02/01/2018   Procedure: Anterior cervical decompression/discectomy/fusion Cervical five-six Cervical six-seven;  Surgeon: Jovita Gamma, MD;  Location: [facility];  Service: Neurosurgery;  Laterality: N/A;  . LASER ABLATION OF THE CERVIX  2013   CIN-2  . VAGINAL CYST EXCISED  2001    There were no vitals filed for this visit.  Subjective Assessment - 04/05/18 1608    Subjective  Pt with no increased pain or soreness from session yesterday. Still feels tightness is very bothersome on R.     Currently in Pain?  Yes    Pain Score  3     Pain Location  Arm    Pain Orientation  Right    Pain Descriptors / Indicators  Burning;Tingling;Aching    Pain Type  Acute pain    Pain Onset  More than a month ago    Pain Frequency  Intermittent    Multiple Pain Sites  Yes    Pain Score  4    Pain  Orientation  Right    Pain Descriptors / Indicators  Aching;Tightness    Pain Type  Acute pain    Pain Onset  More than a month ago    Pain Frequency  Intermittent                       [facility] Adult PT Treatment/Exercise - 04/05/18 1436      Exercises   Exercises  Neck      Neck Exercises: Theraband   Rows  20 reps;Red      Neck Exercises: Standing   Other Standing Exercises  --      Neck Exercises: Seated   Neck Retraction  20 reps    Cervical Rotation  10 reps    Other Seated Exercise  Ulnar nerve glides x15 bil;       Neck Exercises: Supine   Other Supine Exercise  Horizontal Abd 2 lb x15;  Supine flexion 2 lb with cane x15;       Neck Exercises: Sidelying   Other Sidelying Exercise  shoulder ER 2lb x15 bil;       Manual Therapy   Manual Therapy  Soft tissue mobilization;Passive  ROM    Manual therapy comments  skilled palpation and monitoring of soft tissue during dry needling    Soft tissue mobilization  STM/DTM to Bil UT and cervical paraspinals, R levator, sub occipitals bil;     Passive ROM  All motions; Manual UT stretching, R shoulder PROM, all motions, R UE distraction;       Neck Exercises: Stretches   Upper Trapezius Stretch  3 reps;30 seconds    Other Neck Stretches  --       Trigger Point Dry Needling - 04/05/18 1605    Consent Given?  Yes    Muscles Treated Upper Body  Upper trapezius;Levator scapulae    Upper Trapezius Response  Twitch reponse elicited;Palpable increased muscle length    Levator Scapulae Response  Palpable increased muscle length             PT Short Term Goals - 03/29/18 1208      PT SHORT TERM GOAL #1   Title  Pt to report decreased pain in R cervical region and UE, to 5/10     Time  2    Period  Weeks    Status  New    Target Date  04/12/18      PT SHORT TERM GOAL #2   Title  Pt to be independent with initial HEP     Time  2    Period  Weeks    Status  New    Target Date  04/12/18        PT  Long Term Goals - 03/29/18 1209      PT LONG TERM GOAL #1   Title  Pt to report decreased pain in bil cervical and UE region to 2/10 with activity     Time  6    Period  Weeks    Status  New    Target Date  05/10/18      PT LONG TERM GOAL #2   Title  Pt to demo improved cervical flexion ROM, to have only mild to no limitation, and no pain    Time  6    Period  Weeks    Status  New    Target Date  05/10/18      PT LONG TERM GOAL #3   Title  Pt to be independent with final HEP for shoulder and cervical strengthening.     Time  6    Period  Weeks    Status  New    Target Date  05/10/18      PT LONG TERM GOAL #4   Title  Pt to demo improved muscle tension at cervical region, to be WNL, no pain to palpate cervical and upper back musculature    Time  6    Period  Weeks    Status  New    Target Date  05/10/18            Plan - 04/05/18 1611    Clinical Impression Statement  Pt with increased tightness in R cervical region vs L. Address with manual and dry needling today. Pt with slight increase in cervical Rotation and flexion after session today. Pt started on light strengthening for postural muscles.     Rehab Potential  Fair    PT Frequency  2x / week    PT Duration  6 weeks    PT Treatment/Interventions  ADLs/Self Care Home Management;Cryotherapy;Electrical Stimulation;Iontophoresis 4mg /ml Dexamethasone;Functional mobility training;Ultrasound;Moist Heat;Therapeutic activities;Therapeutic exercise;Neuromuscular re-education;Patient/family education;Passive range  of motion;Manual techniques;Dry needling;Taping    Consulted and Agree with Plan of Care  Patient       Patient will benefit from skilled therapeutic intervention in order to improve the following deficits and impairments:  Decreased range of motion, Increased muscle spasms, Decreased activity tolerance, Pain, Improper body mechanics, Impaired flexibility, Hypomobility, Decreased mobility, Decreased  strength  Visit Diagnosis: Cervicalgia  Right cervical radiculopathy  Left cervical radiculopathy  Muscle spasm of back     Problem List Patient Active Problem List   Diagnosis Date Noted  . HNP (herniated nucleus pulposus), cervical 02/01/2018  . Thoracic outlet syndrome 09/07/2017  . Dysesthesia of multiple sites 09/07/2017  . Thoracic back pain 02/18/2017  . Congenital cavus foot 02/18/2017  . History of cervical dysplasia 01/20/2017  . BV (bacterial vaginosis) 12/21/2016  . HSV-1 (herpes simplex virus 1) infection 07/08/2015  . Migraines 08/05/2011  . Allergic rhinitis 06/12/2010  . Asthma 06/05/2010   [name], PT, DPT 4:12 PM  04/05/18   [name] [place], [place], [address] Phone: [phone]   Fax:  [phone]  Name: KINZI FREDIANI MRN: [id number] Date of Birth: 1980/10/06

## 2018-04-10 ENCOUNTER — Ambulatory Visit (INDEPENDENT_AMBULATORY_CARE_PROVIDER_SITE_OTHER): Payer: 59 | Admitting: Physical Therapy

## 2018-04-10 DIAGNOSIS — M5412 Radiculopathy, cervical region: Secondary | ICD-10-CM

## 2018-04-10 DIAGNOSIS — M542 Cervicalgia: Secondary | ICD-10-CM | POA: Diagnosis not present

## 2018-04-10 DIAGNOSIS — M62830 Muscle spasm of back: Secondary | ICD-10-CM | POA: Diagnosis not present

## 2018-04-11 ENCOUNTER — Ambulatory Visit (INDEPENDENT_AMBULATORY_CARE_PROVIDER_SITE_OTHER): Payer: 59 | Admitting: Physical Therapy

## 2018-04-11 ENCOUNTER — Encounter: Payer: Self-pay | Admitting: Physical Therapy

## 2018-04-11 DIAGNOSIS — M9902 Segmental and somatic dysfunction of thoracic region: Secondary | ICD-10-CM | POA: Diagnosis not present

## 2018-04-11 DIAGNOSIS — M62830 Muscle spasm of back: Secondary | ICD-10-CM | POA: Diagnosis not present

## 2018-04-11 DIAGNOSIS — M542 Cervicalgia: Secondary | ICD-10-CM

## 2018-04-11 NOTE — Therapy (Deleted)
[name] [address] [place], [place], [address] Phone: [phone]   Fax:  [phone]  Physical Therapy Treatment  Patient Details  Name: Kristie [name] MRN: [id number] Date of Birth: Jun 11, 1981 Referring Provider (PT): Briscoe Deutscher   Encounter Date: 04/10/2018    Past Medical History:  Diagnosis Date  . Anxiety   . Asthma, exercise induced   . Complication of anesthesia    Very sensitive to medicine  . Dysplasia of cervix, high grade CIN 2   . HNP (herniated nucleus pulposus) with myelopathy, cervical   . HPV in female   . Migraines   . Seasonal allergies     Past Surgical History:  Procedure Laterality Date  . ANTERIOR CERVICAL DECOMP/DISCECTOMY FUSION N/A 02/01/2018   Procedure: Anterior cervical decompression/discectomy/fusion Cervical five-six Cervical six-seven;  Surgeon: Jovita Gamma, MD;  Location: [facility];  Service: Neurosurgery;  Laterality: N/A;  . LASER ABLATION OF THE CERVIX  2013   CIN-2  . VAGINAL CYST EXCISED  2001    There were no vitals filed for this visit.                    [facility] Adult PT Treatment/Exercise - 04/10/18 1517      Exercises   Exercises  Neck      Neck Exercises: Theraband   Rows  20 reps;Red    Other Theraband Exercises  Bil ER YTB x20;       Neck Exercises: Standing   Other Standing Exercises  Standing at wall with noodle for posture: Wall angles 2x5 ;       Neck Exercises: Seated   Neck Retraction  20 reps    Cervical Rotation  10 reps    Other Seated Exercise  --      Neck Exercises: Supine   Other Supine Exercise  Horizontal Abd 2 lb x15;  Supine flexion 2 lb       Neck Exercises: Sidelying   Other Sidelying Exercise  --      Manual Therapy   Manual Therapy  Soft tissue mobilization;Passive ROM    Manual therapy comments  skilled palpation and monitoring of soft tissue during dry needling    Soft tissue mobilization  STM/DTM to Bil UT , sub  occipitals, and cervical paraspinals,     Passive ROM  All motions; Manual UT stretching, R UE distraction;       Neck Exercises: Stretches   Upper Trapezius Stretch  3 reps;30 seconds       Trigger Point Dry Needling - 04/11/18 1236    Consent Given?  Yes    Muscles Treated Upper Body  Upper trapezius    Upper Trapezius Response  Twitch reponse elicited;Palpable increased muscle length   Right            PT Short Term Goals - 03/29/18 1208      PT SHORT TERM GOAL #1   Title  Pt to report decreased pain in R cervical region and UE, to 5/10     Time  2    Period  Weeks    Status  New    Target Date  04/12/18      PT SHORT TERM GOAL #2   Title  Pt to be independent with initial HEP     Time  2    Period  Weeks    Status  New    Target Date  04/12/18  PT Long Term Goals - 03/29/18 1209      PT LONG TERM GOAL #1   Title  Pt to report decreased pain in bil cervical and UE region to 2/10 with activity     Time  6    Period  Weeks    Status  New    Target Date  05/10/18      PT LONG TERM GOAL #2   Title  Pt to demo improved cervical flexion ROM, to have only mild to no limitation, and no pain    Time  6    Period  Weeks    Status  New    Target Date  05/10/18      PT LONG TERM GOAL #3   Title  Pt to be independent with final HEP for shoulder and cervical strengthening.     Time  6    Period  Weeks    Status  New    Target Date  05/10/18      PT LONG TERM GOAL #4   Title  Pt to demo improved muscle tension at cervical region, to be WNL, no pain to palpate cervical and upper back musculature    Time  6    Period  Weeks    Status  New    Target Date  05/10/18              Patient will benefit from skilled therapeutic intervention in order to improve the following deficits and impairments:     Visit Diagnosis: No diagnosis found.     Problem List Patient Active Problem List   Diagnosis Date Noted  . HNP (herniated nucleus  pulposus), cervical 02/01/2018  . Thoracic outlet syndrome 09/07/2017  . Dysesthesia of multiple sites 09/07/2017  . Thoracic back pain 02/18/2017  . Congenital cavus foot 02/18/2017  . History of cervical dysplasia 01/20/2017  . BV (bacterial vaginosis) 12/21/2016  . HSV-1 (herpes simplex virus 1) infection 07/08/2015  . Migraines 08/05/2011  . Allergic rhinitis 06/12/2010  . Asthma 06/05/2010    [name] 04/11/2018, 1:01 PM  [facility] [address] [place], [place], [address] Phone: [phone]   Fax:  [phone]  Name: Kristie [name] MRN: [id number] Date of Birth: 1980/08/12

## 2018-04-11 NOTE — Therapy (Signed)
[facility] [address] [place], [place], [address] Phone: [phone]   Fax:  [phone]  Physical Therapy Treatment  Patient Details  Name: Kristie [name] MRN: [id number] Date of Birth: Jan 07, 1981 Referring Provider (PT): Briscoe Deutscher   Encounter Date: 04/11/2018  PT End of Session - 04/11/18 1523    Visit Number  5    Number of Visits  12    Date for PT Re-Evaluation  05/10/18    Authorization Type  [employer]    PT Start Time  1432    PT Stop Time  1525    PT Time Calculation (min)  53 min    Activity Tolerance  Patient tolerated treatment well;Patient limited by pain    Behavior During Therapy  [facility] for tasks assessed/performed       Past Medical History:  Diagnosis Date  . Anxiety   . Asthma, exercise induced   . Complication of anesthesia    Very sensitive to medicine  . Dysplasia of cervix, high grade CIN 2   . HNP (herniated nucleus pulposus) with myelopathy, cervical   . HPV in female   . Migraines   . Seasonal allergies     Past Surgical History:  Procedure Laterality Date  . ANTERIOR CERVICAL DECOMP/DISCECTOMY FUSION N/A 02/01/2018   Procedure: Anterior cervical decompression/discectomy/fusion Cervical five-six Cervical six-seven;  Surgeon: Jovita Gamma, MD;  Location: [place];  Service: Neurosurgery;  Laterality: N/A;  . LASER ABLATION OF THE CERVIX  2013   CIN-2  . VAGINAL CYST EXCISED  2001    There were no vitals filed for this visit.  Subjective Assessment - 04/11/18 1522    Subjective  Pt states mild soreness in neck, and continued tightness, but overall less pain, and reports less tingling sensation into R UE.     Pain Score  2    Pain Location  Neck    Pain Orientation  Right    Pain Descriptors / Indicators  Aching;Tightness    Pain Type  Acute pain    Pain Onset  More than a month ago    Pain Frequency  Intermittent                       [facility] Adult PT Treatment/Exercise  - 04/11/18 1527      Exercises   Exercises  Neck      Neck Exercises: Theraband   Rows  20 reps;Red    Other Theraband Exercises  Bil ER RTB x20;     Other Theraband Exercises  Scap pull outs RTB x15;       Neck Exercises: Standing   Other Standing Exercises  Standing at wall with noodle for posture: Wall angles x10: UE scaption x10 ;       Neck Exercises: Seated   Neck Retraction  --    Cervical Rotation  --      Neck Exercises: Supine   Other Supine Exercise  --      Modalities   Modalities  Moist Heat      Moist Heat Therapy   Number Minutes Moist Heat  10 Minutes    Moist Heat Location  Cervical   cervical and thoracic     Manual Therapy   Manual Therapy  Soft tissue mobilization;Passive ROM    Manual therapy comments  skilled palpation and monitoring of soft tissue during dry needling    Soft tissue mobilization  STM/DTM and IASTM to R UT,  levator, and rhomboid;     Passive ROM  --      Neck Exercises: Stretches   Upper Trapezius Stretch  3 reps;30 seconds       Trigger Point Dry Needling - 04/11/18 1529    Consent Given?  Yes    Muscles Treated Upper Body  Rhomboids;Levator scapulae    Levator Scapulae Response  Palpable increased muscle length   right   Rhomboids Response  Palpable increased muscle length   right            PT Short Term Goals - 03/29/18 1208      PT SHORT TERM GOAL #1   Title  Pt to report decreased pain in R cervical region and UE, to 5/10     Time  2    Period  Weeks    Status  New    Target Date  04/12/18      PT SHORT TERM GOAL #2   Title  Pt to be independent with initial HEP     Time  2    Period  Weeks    Status  New    Target Date  04/12/18        PT Long Term Goals - 03/29/18 1209      PT LONG TERM GOAL #1   Title  Pt to report decreased pain in bil cervical and UE region to 2/10 with activity     Time  6    Period  Weeks    Status  New    Target Date  05/10/18      PT LONG TERM GOAL #2   Title  Pt  to demo improved cervical flexion ROM, to have only mild to no limitation, and no pain    Time  6    Period  Weeks    Status  New    Target Date  05/10/18      PT LONG TERM GOAL #3   Title  Pt to be independent with final HEP for shoulder and cervical strengthening.     Time  6    Period  Weeks    Status  New    Target Date  05/10/18      PT LONG TERM GOAL #4   Title  Pt to demo improved muscle tension at cervical region, to be WNL, no pain to palpate cervical and upper back musculature    Time  6    Period  Weeks    Status  New    Target Date  05/10/18            Plan - 04/11/18 1526    Clinical Impression Statement  Pt with soreness and tightness at R levator and rhomboid region today, addressed with DTM/IASTM and dry needling. Pt progressed with UE ROM and light strengthening. Plan to progress strength as tolerated and as pain symptoms subside.     Rehab Potential  Fair    PT Frequency  2x / week    PT Duration  6 weeks    PT Treatment/Interventions  ADLs/Self Care Home Management;Cryotherapy;Electrical Stimulation;Iontophoresis 4mg /ml Dexamethasone;Functional mobility training;Ultrasound;Moist Heat;Therapeutic activities;Therapeutic exercise;Neuromuscular re-education;Patient/family education;Passive range of motion;Manual techniques;Dry needling;Taping    Consulted and Agree with Plan of Care  Patient       Patient will benefit from skilled therapeutic intervention in order to improve the following deficits and impairments:  Decreased range of motion, Increased muscle spasms, Decreased activity tolerance, Pain, Improper body mechanics, Impaired flexibility,  Hypomobility, Decreased mobility, Decreased strength  Visit Diagnosis: Cervicalgia  Muscle spasm of back  Somatic dysfunction of thoracic region     Problem List Patient Active Problem List   Diagnosis Date Noted  . HNP (herniated nucleus pulposus), cervical 02/01/2018  . Thoracic outlet syndrome  09/07/2017  . Dysesthesia of multiple sites 09/07/2017  . Thoracic back pain 02/18/2017  . Congenital cavus foot 02/18/2017  . History of cervical dysplasia 01/20/2017  . BV (bacterial vaginosis) 12/21/2016  . HSV-1 (herpes simplex virus 1) infection 07/08/2015  . Migraines 08/05/2011  . Allergic rhinitis 06/12/2010  . Asthma 06/05/2010    [name], PT, DPT 3:30 PM  04/11/18    [facility] [place], [place], [address] Phone: [phone]   Fax:  [phone]  Name: Kristie [name] MRN: [id number] Date of Birth: 1980-07-12

## 2018-04-11 NOTE — Progress Notes (Signed)
Patient is an established patient of mine who is responded well to osteopathic manipulation in the past.  She has been making good progress in physical therapy following anterior cervical decompression but is having mid thoracic back pain that she relates to sitting in a 45 degree angle.  Doyce Para, DPT requested thoracic mobilization and this is supported by physical exam findings as below.   PROCEDURE NOTE : OSTEOPATHIC MANIPULATION The decision today to treat with Osteopathic Manipulative Therapy (OMT) was based on physical exam findings. Verbal consent was obtained following a discussion with the patient regarding the of risks, benefits and potential side effects, including an acute pain flare,post manipulation soreness and need for repeat treatments.     Contraindications to OMT: NONE and recent ACDF - cervical motion minimized  Manipulation was performed as below: Regions Treated OMT Techniques Used  Thoracic spine HVLA muscle energy myofascial release   The patient tolerated the treatment well and reported Improved symptoms following treatment today. Patient was given medications, exercises, stretches and lifestyle modifications per AVS and verbally.   OSTEOPATHIC/STRUCTURAL EXAM:   T3 -10 Neutral, Rotated LEFT, Sidebent RIGHT

## 2018-04-11 NOTE — Therapy (Signed)
[facility] [address] [place], [place], [address] Phone: [phone]   Fax:  [phone]  Physical Therapy Treatment  Patient Details  Name: Kristie [name] MRN: [id number] Date of Birth: 03-15-1981 Referring Provider (PT): Briscoe Deutscher   Encounter Date: 04/10/2018  PT End of Session - 04/11/18 1355    Visit Number  4    Number of Visits  12    Date for PT Re-Evaluation  05/10/18    Authorization Type  [employer]    PT Start Time  1518    PT Stop Time  1602    PT Time Calculation (min)  44 min    Activity Tolerance  Patient tolerated treatment well;Patient limited by pain    Behavior During Therapy  [facility] for tasks assessed/performed       Past Medical History:  Diagnosis Date  . Anxiety   . Asthma, exercise induced   . Complication of anesthesia    Very sensitive to medicine  . Dysplasia of cervix, high grade CIN 2   . HNP (herniated nucleus pulposus) with myelopathy, cervical   . HPV in female   . Migraines   . Seasonal allergies     Past Surgical History:  Procedure Laterality Date  . ANTERIOR CERVICAL DECOMP/DISCECTOMY FUSION N/A 02/01/2018   Procedure: Anterior cervical decompression/discectomy/fusion Cervical five-six Cervical six-seven;  Surgeon: Jovita Gamma, MD;  Location: [place] [facility];  Service: Neurosurgery;  Laterality: N/A;  . LASER ABLATION OF THE CERVIX  2013   CIN-2  . VAGINAL CYST EXCISED  2001    There were no vitals filed for this visit.  Subjective Assessment - 04/11/18 1341    Subjective  Pt states decreased pain. She was able to do 30 min of yoga on Saturday.     Patient Stated Goals  Decreased pain in neck and UEs     Currently in Pain?  Yes    Pain Score  2     Pain Location  Arm    Pain Orientation  Right    Pain Descriptors / Indicators  Burning    Pain Type  Acute pain    Pain Onset  More than a month ago    Pain Frequency  Intermittent    Pain Score  2    Pain Location  Neck    Pain  Orientation  Right    Pain Descriptors / Indicators  Aching;Tightness    Pain Type  Acute pain    Pain Onset  More than a month ago    Pain Frequency  Intermittent                       [facility] Adult PT Treatment/Exercise - 04/10/18 1517      Exercises   Exercises  Neck      Neck Exercises: Theraband   Rows  20 reps;Red    Other Theraband Exercises  Bil ER YTB x20;       Neck Exercises: Standing   Other Standing Exercises  Standing at wall with noodle for posture: Wall angles 2x5 ;       Neck Exercises: Seated   Neck Retraction  20 reps    Cervical Rotation  10 reps    Other Seated Exercise  --      Neck Exercises: Supine   Other Supine Exercise  Horizontal Abd 2 lb x15;  Supine flexion 2 lb       Neck Exercises: Sidelying  Other Sidelying Exercise  --      Manual Therapy   Manual Therapy  Soft tissue mobilization;Passive ROM    Manual therapy comments  skilled palpation and monitoring of soft tissue during dry needling    Soft tissue mobilization  STM/DTM to Bil UT , sub occipitals, and cervical paraspinals,     Passive ROM  All motions; Manual UT stretching, R UE distraction;       Neck Exercises: Stretches   Upper Trapezius Stretch  3 reps;30 seconds       Trigger Point Dry Needling - 04/11/18 1236    Consent Given?  Yes    Muscles Treated Upper Body  Upper trapezius    Upper Trapezius Response  Twitch reponse elicited;Palpable increased muscle length   Right            PT Short Term Goals - 03/29/18 1208      PT SHORT TERM GOAL #1   Title  Pt to report decreased pain in R cervical region and UE, to 5/10     Time  2    Period  Weeks    Status  New    Target Date  04/12/18      PT SHORT TERM GOAL #2   Title  Pt to be independent with initial HEP     Time  2    Period  Weeks    Status  New    Target Date  04/12/18        PT Long Term Goals - 03/29/18 1209      PT LONG TERM GOAL #1   Title  Pt to report decreased pain in bil  cervical and UE region to 2/10 with activity     Time  6    Period  Weeks    Status  New    Target Date  05/10/18      PT LONG TERM GOAL #2   Title  Pt to demo improved cervical flexion ROM, to have only mild to no limitation, and no pain    Time  6    Period  Weeks    Status  New    Target Date  05/10/18      PT LONG TERM GOAL #3   Title  Pt to be independent with final HEP for shoulder and cervical strengthening.     Time  6    Period  Weeks    Status  New    Target Date  05/10/18      PT LONG TERM GOAL #4   Title  Pt to demo improved muscle tension at cervical region, to be WNL, no pain to palpate cervical and upper back musculature    Time  6    Period  Weeks    Status  New    Target Date  05/10/18            Plan - 04/11/18 1358    Clinical Impression Statement  Pt with improving ROM, especially for flexion. She has no increased pain with ther ex today. Pt progressing well.     Rehab Potential  Fair    PT Frequency  2x / week    PT Duration  6 weeks    PT Treatment/Interventions  ADLs/Self Care Home Management;Cryotherapy;Electrical Stimulation;Iontophoresis 4mg /ml Dexamethasone;Functional mobility training;Ultrasound;Moist Heat;Therapeutic activities;Therapeutic exercise;Neuromuscular re-education;Patient/family education;Passive range of motion;Manual techniques;Dry needling;Taping    Consulted and Agree with Plan of Care  Patient       Patient  will benefit from skilled therapeutic intervention in order to improve the following deficits and impairments:  Decreased range of motion, Increased muscle spasms, Decreased activity tolerance, Pain, Improper body mechanics, Impaired flexibility, Hypomobility, Decreased mobility, Decreased strength  Visit Diagnosis: Cervicalgia  Right cervical radiculopathy  Left cervical radiculopathy  Muscle spasm of back     Problem List Patient Active Problem List   Diagnosis Date Noted  . HNP (herniated nucleus  pulposus), cervical 02/01/2018  . Thoracic outlet syndrome 09/07/2017  . Dysesthesia of multiple sites 09/07/2017  . Thoracic back pain 02/18/2017  . Congenital cavus foot 02/18/2017  . History of cervical dysplasia 01/20/2017  . BV (bacterial vaginosis) 12/21/2016  . HSV-1 (herpes simplex virus 1) infection 07/08/2015  . Migraines 08/05/2011  . Allergic rhinitis 06/12/2010  . Asthma 06/05/2010    [name], PT, DPT 3:22 PM  04/11/18    [facility] [place], [place], [address] Phone: [phone]   Fax:  [phone]  Name: Kristie [name] MRN: [id number] Date of Birth: [birth year]

## 2018-04-17 ENCOUNTER — Encounter: Payer: Self-pay | Admitting: Physical Therapy

## 2018-04-17 ENCOUNTER — Ambulatory Visit (INDEPENDENT_AMBULATORY_CARE_PROVIDER_SITE_OTHER): Payer: 59 | Admitting: Physical Therapy

## 2018-04-17 DIAGNOSIS — M62830 Muscle spasm of back: Secondary | ICD-10-CM | POA: Diagnosis not present

## 2018-04-17 DIAGNOSIS — M542 Cervicalgia: Secondary | ICD-10-CM

## 2018-04-18 NOTE — Therapy (Signed)
[facility] [address] [place], [place], [address] Phone: [phone]   Fax:  [phone]  Physical Therapy Treatment  Patient Details  Name: Kristie [name] MRN: [id number] Date of Birth: [birth year] Referring Provider (PT): Briscoe Deutscher   Encounter Date: 04/17/2018  PT End of Session - 04/17/18 1527    Visit Number  6    Number of Visits  12    Date for PT Re-Evaluation  05/10/18    Authorization Type  [employer]-cone    PT Start Time  1515    PT Stop Time  1559    PT Time Calculation (min)  44 min    Activity Tolerance  Patient tolerated treatment well;Patient limited by pain    Behavior During Therapy  [facility] for tasks assessed/performed       Past Medical History:  Diagnosis Date  . Anxiety   . Asthma, exercise induced   . Complication of anesthesia    Very sensitive to medicine  . Dysplasia of cervix, high grade CIN 2   . HNP (herniated nucleus pulposus) with myelopathy, cervical   . HPV in female   . Migraines   . Seasonal allergies     Past Surgical History:  Procedure Laterality Date  . ANTERIOR CERVICAL DECOMP/DISCECTOMY FUSION N/A 02/01/2018   Procedure: Anterior cervical decompression/discectomy/fusion Cervical five-six Cervical six-seven;  Surgeon: Jovita Gamma, MD;  Location: [facility];  Service: Neurosurgery;  Laterality: N/A;  . LASER ABLATION OF THE CERVIX  2013   CIN-2  . VAGINAL CYST EXCISED  2001    There were no vitals filed for this visit.  Subjective Assessment - 04/17/18 1521    Subjective  Pt states that pain continues to improve. She has had less pain down R UE, and has been able to increase activity/yoga.     Currently in Pain?  Yes    Pain Score  3     Pain Location  Arm    Pain Orientation  Right    Pain Descriptors / Indicators  Burning    Pain Type  Acute pain    Pain Onset  More than a month ago    Pain Frequency  Intermittent    Pain Score  3    Pain Location  Neck    Pain Orientation   Right    Pain Descriptors / Indicators  Aching;Tightness    Pain Type  Acute pain    Pain Onset  More than a month ago    Pain Frequency  Intermittent                       [facility] Adult PT Treatment/Exercise - 04/18/18 0001      Exercises   Exercises  Neck      Neck Exercises: Theraband   Rows  20 reps;Green    Other Theraband Exercises  Bil ER RTB x20;     Other Theraband Exercises  Scap pull outs RTB x15;       Neck Exercises: Standing   Other Standing Exercises  Standing at wall with noodle for posture: Wall angles x10: UE scaption x10 ;       Neck Exercises: Seated   Neck Retraction  20 reps      Modalities   Modalities  Moist Heat      Moist Heat Therapy   Moist Heat Location  Cervical   cervical and thoracic     Manual Therapy   Manual Therapy  Soft tissue mobilization;Passive ROM    Manual therapy comments  skilled palpation and monitoring of soft tissue during dry needling    Soft tissue mobilization  STM/DTM and IASTM to R UT, levator, and rhomboid;     Passive ROM  Pec stretch bilateral; cervcial PROM all motions;       Neck Exercises: Stretches   Corner Stretch  3 reps;30 seconds    Other Neck Stretches  Supine pec stretch with nerve glides x20;  Seated Ulnar nerve glide x20;        Trigger Point Dry Needling - 04/18/18 0858    Consent Given?  Yes    Muscles Treated Upper Body  Levator scapulae;Upper trapezius    Upper Trapezius Response  Twitch reponse elicited;Palpable increased muscle length   right   Levator Scapulae Response  Palpable increased muscle length   right          PT Education - 04/18/18 0902    Education Details  HEP reviewed    Person(s) Educated  Patient    Methods  Explanation    Comprehension  Verbalized understanding       PT Short Term Goals - 04/18/18 0902      PT SHORT TERM GOAL #1   Title  Pt to report decreased pain in R cervical region and UE, to 5/10     Time  2    Period  Weeks    Status   Achieved      PT SHORT TERM GOAL #2   Title  Pt to be independent with initial HEP     Time  2    Period  Weeks    Status  Achieved        PT Long Term Goals - 03/29/18 1209      PT LONG TERM GOAL #1   Title  Pt to report decreased pain in bil cervical and UE region to 2/10 with activity     Time  6    Period  Weeks    Status  New    Target Date  05/10/18      PT LONG TERM GOAL #2   Title  Pt to demo improved cervical flexion ROM, to have only mild to no limitation, and no pain    Time  6    Period  Weeks    Status  New    Target Date  05/10/18      PT LONG TERM GOAL #3   Title  Pt to be independent with final HEP for shoulder and cervical strengthening.     Time  6    Period  Weeks    Status  New    Target Date  05/10/18      PT LONG TERM GOAL #4   Title  Pt to demo improved muscle tension at cervical region, to be WNL, no pain to palpate cervical and upper back musculature    Time  6    Period  Weeks    Status  New    Target Date  05/10/18            Plan - 04/18/18 7829    Clinical Impression Statement  Pt with improving ability for AROM in all directions. She also reports decreased symptoms into L and R UE. She continues to have painful trigger points in R levator and upper trap region, addressed with DTM and dry needling today. Pt with follow up with MD later this week. Pt  to benefit from continued care, progressing well.     Rehab Potential  Fair    PT Frequency  2x / week    PT Duration  6 weeks    PT Treatment/Interventions  ADLs/Self Care Home Management;Cryotherapy;Electrical Stimulation;Iontophoresis 4mg /ml Dexamethasone;Functional mobility training;Ultrasound;Moist Heat;Therapeutic activities;Therapeutic exercise;Neuromuscular re-education;Patient/family education;Passive range of motion;Manual techniques;Dry needling;Taping    Consulted and Agree with Plan of Care  Patient       Patient will benefit from skilled therapeutic intervention in order  to improve the following deficits and impairments:  Decreased range of motion, Increased muscle spasms, Decreased activity tolerance, Pain, Improper body mechanics, Impaired flexibility, Hypomobility, Decreased mobility, Decreased strength  Visit Diagnosis: Cervicalgia  Muscle spasm of back     Problem List Patient Active Problem List   Diagnosis Date Noted  . HNP (herniated nucleus pulposus), cervical 02/01/2018  . Thoracic outlet syndrome 09/07/2017  . Dysesthesia of multiple sites 09/07/2017  . Thoracic back pain 02/18/2017  . Congenital cavus foot 02/18/2017  . History of cervical dysplasia 01/20/2017  . BV (bacterial vaginosis) 12/21/2016  . HSV-1 (herpes simplex virus 1) infection 07/08/2015  . Migraines 08/05/2011  . Allergic rhinitis 06/12/2010  . Asthma 06/05/2010     [name], PT, DPT 9:05 AM  04/18/18    [facility] [place], [place], [address] Phone: [phone]   Fax:  [phone]  Name: LAIZA VEENSTRA MRN: [id number] Date of Birth: 05-29-1981

## 2018-04-19 ENCOUNTER — Ambulatory Visit (INDEPENDENT_AMBULATORY_CARE_PROVIDER_SITE_OTHER): Payer: 59 | Admitting: Physical Therapy

## 2018-04-19 DIAGNOSIS — M62830 Muscle spasm of back: Secondary | ICD-10-CM

## 2018-04-19 DIAGNOSIS — M542 Cervicalgia: Secondary | ICD-10-CM

## 2018-04-21 DIAGNOSIS — M542 Cervicalgia: Secondary | ICD-10-CM | POA: Diagnosis not present

## 2018-04-21 DIAGNOSIS — M5030 Other cervical disc degeneration, unspecified cervical region: Secondary | ICD-10-CM | POA: Diagnosis not present

## 2018-04-21 DIAGNOSIS — M47812 Spondylosis without myelopathy or radiculopathy, cervical region: Secondary | ICD-10-CM | POA: Diagnosis not present

## 2018-04-21 DIAGNOSIS — Z981 Arthrodesis status: Secondary | ICD-10-CM | POA: Diagnosis not present

## 2018-04-23 ENCOUNTER — Encounter: Payer: Self-pay | Admitting: Physical Therapy

## 2018-04-23 NOTE — Therapy (Signed)
[facility] [address] [place], [place], [address] Phone: [phone]   Fax:  [phone]  Physical Therapy Treatment  Patient Details  Name: Kristie [name] MRN: [id number] Date of Birth: 03/18/1981 Referring Provider (PT): Briscoe Deutscher   Encounter Date: 04/19/2018  PT End of Session - 04/23/18 1408    Visit Number  7    Number of Visits  12    Date for PT Re-Evaluation  05/10/18    Authorization Type  [employer]    PT Start Time  1602    PT Stop Time  1652    PT Time Calculation (min)  50 min    Activity Tolerance  Patient tolerated treatment well;Patient limited by pain    Behavior During Therapy  [facility] for tasks assessed/performed       Past Medical History:  Diagnosis Date  . Anxiety   . Asthma, exercise induced   . Complication of anesthesia    Very sensitive to medicine  . Dysplasia of cervix, high grade CIN 2   . HNP (herniated nucleus pulposus) with myelopathy, cervical   . HPV in female   . Migraines   . Seasonal allergies     Past Surgical History:  Procedure Laterality Date  . ANTERIOR CERVICAL DECOMP/DISCECTOMY FUSION N/A 02/01/2018   Procedure: Anterior cervical decompression/discectomy/fusion Cervical five-six Cervical six-seven;  Surgeon: Jovita Gamma, MD;  Location: [place];  Service: Neurosurgery;  Laterality: N/A;  . LASER ABLATION OF THE CERVIX  2013   CIN-2  . VAGINAL CYST EXCISED  2001    There were no vitals filed for this visit.  Subjective Assessment - 04/23/18 1407    Subjective  Pt states increased soreness , did cycling class yesterday.     Currently in Pain?  Yes    Pain Score  2     Pain Location  Arm    Pain Orientation  Right    Pain Descriptors / Indicators  Burning    Pain Type  Acute pain    Pain Onset  More than a month ago    Pain Frequency  Intermittent    Pain Score  2    Pain Location  Neck    Pain Orientation  Right    Pain Descriptors / Indicators  Tightness    Pain  Type  Acute pain    Pain Onset  More than a month ago    Pain Frequency  Intermittent                       [facility] Adult PT Treatment/Exercise - 04/23/18 0001      Exercises   Exercises  Neck      Neck Exercises: Theraband   Rows  20 reps;Green    Other Theraband Exercises  Bil ER RTB x20;     Other Theraband Exercises  Scap pull outs RTB x20;       Neck Exercises: Standing   Other Standing Exercises  Standing at wall with noodle for posture: Wall angles x10: UE scaption x10 ;  2 lb bil;       Neck Exercises: Seated   Neck Retraction  20 reps      Neck Exercises: Supine   Other Supine Exercise  Horizontal Abd 2 lb x15;        Neck Exercises: Prone   Other Prone Exercise  Prone T's x15 bil;       Neck Exercises: Stabilization   Stabilization  quadruped: weight shifts x10 L/R and A/P;  UE lifts x10: LE lifts x10; Protraction x10;       Modalities   Modalities  Moist Heat      Manual Therapy   Manual Therapy  Soft tissue mobilization;Passive ROM    Manual therapy comments  skilled palpation and monitoring of soft tissue during dry needling       Trigger Point Dry Needling - 04/23/18 1406    Consent Given?  Yes    Muscles Treated Upper Body  Levator scapulae;Upper trapezius    Upper Trapezius Response  Twitch reponse elicited;Palpable increased muscle length    Levator Scapulae Response  Twitch response elicited;Palpable increased muscle length             PT Short Term Goals - 04/18/18 0902      PT SHORT TERM GOAL #1   Title  Pt to report decreased pain in R cervical region and UE, to 5/10     Time  2    Period  Weeks    Status  Achieved      PT SHORT TERM GOAL #2   Title  Pt to be independent with initial HEP     Time  2    Period  Weeks    Status  Achieved        PT Long Term Goals - 03/29/18 1209      PT LONG TERM GOAL #1   Title  Pt to report decreased pain in bil cervical and UE region to 2/10 with activity     Time  6     Period  Weeks    Status  New    Target Date  05/10/18      PT LONG TERM GOAL #2   Title  Pt to demo improved cervical flexion ROM, to have only mild to no limitation, and no pain    Time  6    Period  Weeks    Status  New    Target Date  05/10/18      PT LONG TERM GOAL #3   Title  Pt to be independent with final HEP for shoulder and cervical strengthening.     Time  6    Period  Weeks    Status  New    Target Date  05/10/18      PT LONG TERM GOAL #4   Title  Pt to demo improved muscle tension at cervical region, to be WNL, no pain to palpate cervical and upper back musculature    Time  6    Period  Weeks    Status  New    Target Date  05/10/18            Plan - 04/23/18 1408    Clinical Impression Statement  Pt with trigger points in R levator, addressed with dry needling today. Pt with improving ability for neck ROM and UE moblity. She has noted weakness in upper back, scapular muscles , seen with quadruped activity today, will benefit from continued strengthening.     Rehab Potential  Fair    PT Frequency  2x / week    PT Duration  6 weeks    PT Treatment/Interventions  ADLs/Self Care Home Management;Cryotherapy;Electrical Stimulation;Iontophoresis 4mg /ml Dexamethasone;Functional mobility training;Ultrasound;Moist Heat;Therapeutic activities;Therapeutic exercise;Neuromuscular re-education;Patient/family education;Passive range of motion;Manual techniques;Dry needling;Taping    Consulted and Agree with Plan of Care  Patient       Patient will benefit from skilled therapeutic  intervention in order to improve the following deficits and impairments:  Decreased range of motion, Increased muscle spasms, Decreased activity tolerance, Pain, Improper body mechanics, Impaired flexibility, Hypomobility, Decreased mobility, Decreased strength  Visit Diagnosis: Cervicalgia  Muscle spasm of back     Problem List Patient Active Problem List   Diagnosis Date Noted  . HNP  (herniated nucleus pulposus), cervical 02/01/2018  . Thoracic outlet syndrome 09/07/2017  . Dysesthesia of multiple sites 09/07/2017  . Thoracic back pain 02/18/2017  . Congenital cavus foot 02/18/2017  . History of cervical dysplasia 01/20/2017  . BV (bacterial vaginosis) 12/21/2016  . HSV-1 (herpes simplex virus 1) infection 07/08/2015  . Migraines 08/05/2011  . Allergic rhinitis 06/12/2010  . Asthma 06/05/2010    [name], PT, DPT 2:10 PM  04/23/18    [facility] [place], [place], [address] Phone: [phone]   Fax:  [phone]  Name: Kristie [name] MRN: [id number] Date of Birth: [birth year]

## 2018-04-24 ENCOUNTER — Encounter: Payer: Self-pay | Admitting: Physical Therapy

## 2018-04-24 ENCOUNTER — Ambulatory Visit (INDEPENDENT_AMBULATORY_CARE_PROVIDER_SITE_OTHER): Payer: 59 | Admitting: Physical Therapy

## 2018-04-24 DIAGNOSIS — N943 Premenstrual tension syndrome: Secondary | ICD-10-CM | POA: Diagnosis not present

## 2018-04-24 DIAGNOSIS — Z77120 Contact with and (suspected) exposure to mold (toxic): Secondary | ICD-10-CM | POA: Diagnosis not present

## 2018-04-24 DIAGNOSIS — R51 Headache: Secondary | ICD-10-CM | POA: Diagnosis not present

## 2018-04-24 DIAGNOSIS — M542 Cervicalgia: Secondary | ICD-10-CM

## 2018-04-24 DIAGNOSIS — R52 Pain, unspecified: Secondary | ICD-10-CM | POA: Diagnosis not present

## 2018-04-24 DIAGNOSIS — K929 Disease of digestive system, unspecified: Secondary | ICD-10-CM | POA: Diagnosis not present

## 2018-04-24 DIAGNOSIS — E039 Hypothyroidism, unspecified: Secondary | ICD-10-CM | POA: Diagnosis not present

## 2018-04-24 DIAGNOSIS — M62830 Muscle spasm of back: Secondary | ICD-10-CM | POA: Diagnosis not present

## 2018-04-24 DIAGNOSIS — R5383 Other fatigue: Secondary | ICD-10-CM | POA: Diagnosis not present

## 2018-04-24 DIAGNOSIS — E612 Magnesium deficiency: Secondary | ICD-10-CM | POA: Diagnosis not present

## 2018-04-24 DIAGNOSIS — E559 Vitamin D deficiency, unspecified: Secondary | ICD-10-CM | POA: Diagnosis not present

## 2018-04-24 NOTE — Therapy (Signed)
[facility] [address] [place], [place], [address] Phone: [phone]   Fax:  [phone]  Physical Therapy Treatment  Patient Details  Name: Kristie [name] MRN: [id number] Date of Birth: Sep 16, 1980 Referring Provider (PT): Briscoe Deutscher   Encounter Date: 04/24/2018  PT End of Session - 04/24/18 1644    Visit Number  8    Number of Visits  12    Date for PT Re-Evaluation  05/10/18    Authorization Type  [employer]R-cone    PT Start Time  1604    PT Stop Time  1650    PT Time Calculation (min)  46 min    Activity Tolerance  Patient tolerated treatment well;Patient limited by pain    Behavior During Therapy  [facility] for tasks assessed/performed       Past Medical History:  Diagnosis Date  . Anxiety   . Asthma, exercise induced   . Complication of anesthesia    Very sensitive to medicine  . Dysplasia of cervix, high grade CIN 2   . HNP (herniated nucleus pulposus) with myelopathy, cervical   . HPV in female   . Migraines   . Seasonal allergies     Past Surgical History:  Procedure Laterality Date  . ANTERIOR CERVICAL DECOMP/DISCECTOMY FUSION N/A 02/01/2018   Procedure: Anterior cervical decompression/discectomy/fusion Cervical five-six Cervical six-seven;  Surgeon: Jovita Gamma, MD;  Location: [place];  Service: Neurosurgery;  Laterality: N/A;  . LASER ABLATION OF THE CERVIX  2013   CIN-2  . VAGINAL CYST EXCISED  2001    There were no vitals filed for this visit.  Subjective Assessment - 04/24/18 1642    Subjective  Pt states increased symptoms on R, feels like it did initially, with increased tingling into arm and fingers. She has been very uncomfortable over the weekend. She did have follow up with Neuro on Friday, at which time she was not having increased symptoms .    Limitations  Sitting;Reading;Lifting;Standing;Walking;House hold activities;Writing    Patient Stated Goals  Decreased pain in neck and UEs     Currently in  Pain?  Yes    Pain Score  5     Pain Location  Arm    Pain Orientation  Right    Pain Descriptors / Indicators  Burning;Tingling    Pain Type  Acute pain    Pain Onset  More than a month ago    Pain Frequency  Intermittent                       [facility] Adult PT Treatment/Exercise - 04/24/18 1619      Exercises   Exercises  Neck      Neck Exercises: Theraband   Rows  20 reps;Red    Other Theraband Exercises  Bil ER RTB x20;     Other Theraband Exercises  --      Neck Exercises: Standing   Other Standing Exercises  --      Neck Exercises: Seated   Neck Retraction  20 reps    Other Seated Exercise  Nerve glides x10 each: median and ulnar;       Neck Exercises: Supine   Other Supine Exercise  --      Neck Exercises: Prone   Other Prone Exercise  --      Neck Exercises: Stabilization   Stabilization  --      Modalities   Modalities  Moist Heat  Moist Heat Therapy   Number Minutes Moist Heat  10 Minutes    Moist Heat Location  Cervical      Manual Therapy   Manual Therapy  Soft tissue mobilization;Passive ROM    Manual therapy comments  --    Soft tissue mobilization  STM to bil cervical region and UT    Passive ROM  Cervical, all motions      Neck Exercises: Stretches   Corner Stretch  3 reps;30 seconds               PT Short Term Goals - 04/18/18 0902      PT SHORT TERM GOAL #1   Title  Pt to report decreased pain in R cervical region and UE, to 5/10     Time  2    Period  Weeks    Status  Achieved      PT SHORT TERM GOAL #2   Title  Pt to be independent with initial HEP     Time  2    Period  Weeks    Status  Achieved        PT Long Term Goals - 03/29/18 1209      PT LONG TERM GOAL #1   Title  Pt to report decreased pain in bil cervical and UE region to 2/10 with activity     Time  6    Period  Weeks    Status  New    Target Date  05/10/18      PT LONG TERM GOAL #2   Title  Pt to demo improved cervical flexion  ROM, to have only mild to no limitation, and no pain    Time  6    Period  Weeks    Status  New    Target Date  05/10/18      PT LONG TERM GOAL #3   Title  Pt to be independent with final HEP for shoulder and cervical strengthening.     Time  6    Period  Weeks    Status  New    Target Date  05/10/18      PT LONG TERM GOAL #4   Title  Pt to demo improved muscle tension at cervical region, to be WNL, no pain to palpate cervical and upper back musculature    Time  6    Period  Weeks    Status  New    Target Date  05/10/18            Plan - 04/24/18 1645    Clinical Impression Statement  Pt with increased nerve pain in R UE today. Increased pain with R rotation and SB as well. Discussed notifying Neurologist of this pain onset. Discussed backing off too much activity, and doing stretching, light ROM. Pt to benefit from continued care.     Rehab Potential  Fair    PT Frequency  2x / week    PT Duration  6 weeks    PT Treatment/Interventions  ADLs/Self Care Home Management;Cryotherapy;Electrical Stimulation;Iontophoresis 4mg /ml Dexamethasone;Functional mobility training;Ultrasound;Moist Heat;Therapeutic activities;Therapeutic exercise;Neuromuscular re-education;Patient/family education;Passive range of motion;Manual techniques;Dry needling;Taping    Consulted and Agree with Plan of Care  Patient       Patient will benefit from skilled therapeutic intervention in order to improve the following deficits and impairments:  Decreased range of motion, Increased muscle spasms, Decreased activity tolerance, Pain, Improper body mechanics, Impaired flexibility, Hypomobility, Decreased mobility, Decreased strength  Visit  Diagnosis: Cervicalgia  Muscle spasm of back     Problem List Patient Active Problem List   Diagnosis Date Noted  . HNP (herniated nucleus pulposus), cervical 02/01/2018  . Thoracic outlet syndrome 09/07/2017  . Dysesthesia of multiple sites 09/07/2017  .  Thoracic back pain 02/18/2017  . Congenital cavus foot 02/18/2017  . History of cervical dysplasia 01/20/2017  . BV (bacterial vaginosis) 12/21/2016  . HSV-1 (herpes simplex virus 1) infection 07/08/2015  . Migraines 08/05/2011  . Allergic rhinitis 06/12/2010  . Asthma 06/05/2010    [name], PT, DPT 4:47 PM  04/24/18    [facility] [place], [place], [address] Phone: [phone]   Fax:  [phone]  Name: Kristie [name] MRN: [id number] Date of Birth: [birth year]

## 2018-04-26 ENCOUNTER — Ambulatory Visit (INDEPENDENT_AMBULATORY_CARE_PROVIDER_SITE_OTHER): Payer: 59 | Admitting: Physical Therapy

## 2018-04-26 ENCOUNTER — Encounter: Payer: Self-pay | Admitting: Physical Therapy

## 2018-04-26 DIAGNOSIS — M542 Cervicalgia: Secondary | ICD-10-CM | POA: Diagnosis not present

## 2018-04-26 DIAGNOSIS — M62830 Muscle spasm of back: Secondary | ICD-10-CM | POA: Diagnosis not present

## 2018-04-26 NOTE — Therapy (Signed)
[facility] [address] [place], [place], [address] Phone: [phone]   Fax:  [phone]  Physical Therapy Treatment  Patient Details  Name: Kristie [name] MRN: [id number] Date of Birth: 07/23/1980 Referring Provider (PT): Briscoe Deutscher   Encounter Date: 04/26/2018  PT End of Session - 04/26/18 1655    Visit Number  9    Number of Visits  12    Date for PT Re-Evaluation  05/10/18    Authorization Type  [employer]R-cone    PT Start Time  1603    PT Stop Time  1643    PT Time Calculation (min)  40 min    Activity Tolerance  Patient tolerated treatment well;Patient limited by pain    Behavior During Therapy  [facility] for tasks assessed/performed       Past Medical History:  Diagnosis Date  . Anxiety   . Asthma, exercise induced   . Complication of anesthesia    Very sensitive to medicine  . Dysplasia of cervix, high grade CIN 2   . HNP (herniated nucleus pulposus) with myelopathy, cervical   . HPV in female   . Migraines   . Seasonal allergies     Past Surgical History:  Procedure Laterality Date  . ANTERIOR CERVICAL DECOMP/DISCECTOMY FUSION N/A 02/01/2018   Procedure: Anterior cervical decompression/discectomy/fusion Cervical five-six Cervical six-seven;  Surgeon: Jovita Gamma, MD;  Location: [facility];  Service: Neurosurgery;  Laterality: N/A;  . LASER ABLATION OF THE CERVIX  2013   CIN-2  . VAGINAL CYST EXCISED  2001    There were no vitals filed for this visit.  Subjective Assessment - 04/26/18 1654    Subjective  Pt states increasing symptoms on R, with tingling into UE. She has been very frustrated with this return of pain.     Currently in Pain?  Yes    Pain Score  6     Pain Location  Arm    Pain Orientation  Right    Pain Descriptors / Indicators  Burning;Tingling    Pain Type  Acute pain    Pain Onset  More than a month ago    Pain Frequency  Intermittent                       [facility] Adult PT  Treatment/Exercise - 04/26/18 0001      Exercises   Exercises  Neck      Neck Exercises: Theraband   Rows  20 reps;Red    Other Theraband Exercises  Bil ER RTB x20;       Neck Exercises: Standing   Other Standing Exercises  cervical extension x15;       Neck Exercises: Seated   Neck Retraction  --    Other Seated Exercise  --      Neck Exercises: Prone   Other Prone Exercise  Prone T's x20 bil;       Neck Exercises: Stabilization   Stabilization  quadruped: serratus punch x20;       Modalities   Modalities  Moist Heat      Moist Heat Therapy   Moist Heat Location  --      Manual Therapy   Manual Therapy  Soft tissue mobilization;Passive ROM    Soft tissue mobilization  STM to bil cervical region and UT    Passive ROM  Cervical, all motions      Neck Exercises: Stretches   Corner Stretch  3 reps;30  seconds    Other Neck Stretches  Supine pec stretch with nerve glides x20;  Seated Ulnar nerve glide x20;                PT Short Term Goals - 04/18/18 0902      PT SHORT TERM GOAL #1   Title  Pt to report decreased pain in R cervical region and UE, to 5/10     Time  2    Period  Weeks    Status  Achieved      PT SHORT TERM GOAL #2   Title  Pt to be independent with initial HEP     Time  2    Period  Weeks    Status  Achieved        PT Long Term Goals - 03/29/18 1209      PT LONG TERM GOAL #1   Title  Pt to report decreased pain in bil cervical and UE region to 2/10 with activity     Time  6    Period  Weeks    Status  New    Target Date  05/10/18      PT LONG TERM GOAL #2   Title  Pt to demo improved cervical flexion ROM, to have only mild to no limitation, and no pain    Time  6    Period  Weeks    Status  New    Target Date  05/10/18      PT LONG TERM GOAL #3   Title  Pt to be independent with final HEP for shoulder and cervical strengthening.     Time  6    Period  Weeks    Status  New    Target Date  05/10/18      PT LONG TERM GOAL  #4   Title  Pt to demo improved muscle tension at cervical region, to be WNL, no pain to palpate cervical and upper back musculature    Time  6    Period  Weeks    Status  New    Target Date  05/10/18            Plan - 04/26/18 1655    Clinical Impression Statement  Pt with increased tingling sensation in R UE, increased with light compression(spurling). Pt unable to find relief of this pain with movements and stretching, other than laying supine and sleeping. Recommended that pt follow up with Neurologist this or next week, to discuss sympotms. Will hold PT at this time, until pt sees MD.     Rehab Potential  Fair    PT Frequency  2x / week    PT Duration  6 weeks    PT Treatment/Interventions  ADLs/Self Care Home Management;Cryotherapy;Electrical Stimulation;Iontophoresis 4mg /ml Dexamethasone;Functional mobility training;Ultrasound;Moist Heat;Therapeutic activities;Therapeutic exercise;Neuromuscular re-education;Patient/family education;Passive range of motion;Manual techniques;Dry needling;Taping    Consulted and Agree with Plan of Care  Patient       Patient will benefit from skilled therapeutic intervention in order to improve the following deficits and impairments:  Decreased range of motion, Increased muscle spasms, Decreased activity tolerance, Pain, Improper body mechanics, Impaired flexibility, Hypomobility, Decreased mobility, Decreased strength  Visit Diagnosis: Cervicalgia  Muscle spasm of back     Problem List Patient Active Problem List   Diagnosis Date Noted  . HNP (herniated nucleus pulposus), cervical 02/01/2018  . Thoracic outlet syndrome 09/07/2017  . Dysesthesia of multiple sites 09/07/2017  . Thoracic back pain 02/18/2017  .  Congenital cavus foot 02/18/2017  . History of cervical dysplasia 01/20/2017  . BV (bacterial vaginosis) 12/21/2016  . HSV-1 (herpes simplex virus 1) infection 07/08/2015  . Migraines 08/05/2011  . Allergic rhinitis 06/12/2010   . Asthma 06/05/2010    [name], PT, DPT 4:57 PM  04/26/18    [facility] [place], [place], [address] Phone: [phone]   Fax:  [phone]  Name: SHENEE WIGNALL MRN: [id number] Date of Birth: 05-25-1981

## 2018-05-01 ENCOUNTER — Encounter: Payer: 59 | Admitting: Physical Therapy

## 2018-05-03 ENCOUNTER — Encounter: Payer: 59 | Admitting: Physical Therapy

## 2018-05-08 ENCOUNTER — Encounter: Payer: 59 | Admitting: Physical Therapy

## 2018-05-10 ENCOUNTER — Encounter: Payer: 59 | Admitting: Physical Therapy

## 2018-05-10 ENCOUNTER — Encounter: Payer: Self-pay | Admitting: Sports Medicine

## 2018-05-15 ENCOUNTER — Encounter: Payer: 59 | Admitting: Physical Therapy

## 2018-05-15 MED FILL — VALACYCLOVIR HCL 500 MG TAB: 500 | 90 days supply | Qty: 180 | Fill #1

## 2018-05-16 DIAGNOSIS — M542 Cervicalgia: Secondary | ICD-10-CM | POA: Diagnosis not present

## 2018-05-16 DIAGNOSIS — S129XXA Fracture of neck, unspecified, initial encounter: Secondary | ICD-10-CM | POA: Diagnosis not present

## 2018-05-16 DIAGNOSIS — M4722 Other spondylosis with radiculopathy, cervical region: Secondary | ICD-10-CM | POA: Diagnosis not present

## 2018-05-16 DIAGNOSIS — M5412 Radiculopathy, cervical region: Secondary | ICD-10-CM | POA: Diagnosis not present

## 2018-05-16 DIAGNOSIS — M5030 Other cervical disc degeneration, unspecified cervical region: Secondary | ICD-10-CM | POA: Diagnosis not present

## 2018-05-16 DIAGNOSIS — Z981 Arthrodesis status: Secondary | ICD-10-CM | POA: Diagnosis not present

## 2018-05-16 MED FILL — DICLOFENAC SODIUM 75 MG TAB: 75 | 30 days supply | Qty: 60 | Fill #0

## 2018-05-18 ENCOUNTER — Other Ambulatory Visit: Payer: Self-pay | Admitting: Neurosurgery

## 2018-05-18 DIAGNOSIS — M5412 Radiculopathy, cervical region: Secondary | ICD-10-CM

## 2018-05-28 ENCOUNTER — Other Ambulatory Visit: Payer: 59

## 2018-05-30 ENCOUNTER — Ambulatory Visit (INDEPENDENT_AMBULATORY_CARE_PROVIDER_SITE_OTHER): Payer: 59 | Admitting: Sports Medicine

## 2018-05-30 ENCOUNTER — Encounter: Payer: Self-pay | Admitting: Family Medicine

## 2018-05-30 ENCOUNTER — Ambulatory Visit (INDEPENDENT_AMBULATORY_CARE_PROVIDER_SITE_OTHER): Payer: 59 | Admitting: Family Medicine

## 2018-05-30 ENCOUNTER — Encounter: Payer: Self-pay | Admitting: Sports Medicine

## 2018-05-30 VITALS — BP 126/68 | HR 86 | Temp 98.2°F | Ht 66.0 in | Wt 133.8 lb

## 2018-05-30 VITALS — BP 126/68 | HR 86 | Ht 66.0 in | Wt 133.8 lb

## 2018-05-30 DIAGNOSIS — M9901 Segmental and somatic dysfunction of cervical region: Secondary | ICD-10-CM | POA: Diagnosis not present

## 2018-05-30 DIAGNOSIS — M9903 Segmental and somatic dysfunction of lumbar region: Secondary | ICD-10-CM

## 2018-05-30 DIAGNOSIS — M9902 Segmental and somatic dysfunction of thoracic region: Secondary | ICD-10-CM

## 2018-05-30 DIAGNOSIS — M546 Pain in thoracic spine: Secondary | ICD-10-CM | POA: Diagnosis not present

## 2018-05-30 DIAGNOSIS — M62830 Muscle spasm of back: Secondary | ICD-10-CM | POA: Diagnosis not present

## 2018-05-30 DIAGNOSIS — G540 Brachial plexus disorders: Secondary | ICD-10-CM

## 2018-05-30 DIAGNOSIS — G8929 Other chronic pain: Secondary | ICD-10-CM | POA: Diagnosis not present

## 2018-05-30 DIAGNOSIS — M9908 Segmental and somatic dysfunction of rib cage: Secondary | ICD-10-CM

## 2018-05-30 DIAGNOSIS — R200 Anesthesia of skin: Secondary | ICD-10-CM

## 2018-05-30 DIAGNOSIS — R202 Paresthesia of skin: Secondary | ICD-10-CM

## 2018-05-30 DIAGNOSIS — M5020 Other cervical disc displacement, unspecified cervical region: Secondary | ICD-10-CM

## 2018-05-30 NOTE — Progress Notes (Signed)
ANNE BOLTZ is a 37 y.o. female here for an acute visit.  History of Present Illness:   HPI: S/p two-level C5-6 and C6-7 anterior cervical decompression arthrodesis with structural allograft and cervical plating. She is worried that there have been changes to lower cervical level. [place] following. New MRI on Friday. Previous thoracic spine MRI with reported bulge. Lumbar spine reported normal.Diclofenac and PT were somewhat helpful. Stopped due to PT discomfort with continuing due to symptoms. OMT helpful before.   PMHx, SurgHx, SocialHx, Medications, and Allergies were reviewed in the Visit Navigator and updated as appropriate.  Current Medications:   .  Acetylcysteine (NAC 600) 600 MG CAPS, Take 600 mg by mouth daily., Disp: , Rfl:  .  albuterol (PROVENTIL HFA;VENTOLIN HFA) 108 (90 Base) MCG/ACT inhaler, Inhale 2 puffs every 6 (six) hours as needed into the lungs for wheezing or shortness of breath., Disp: 1 Inhaler, Rfl: 0 .  Ascorbic Acid (VITAMIN C PO), Take 560 mg by mouth 2 (two) times daily., Disp: , Rfl:  .  ASHWAGANDHA PO, Take 450 mg by mouth daily., Disp: , Rfl:  .  baclofen (LIORESAL) 10 MG tablet, Take 1 tablet (10 mg total) by mouth 3 (three) times daily., Disp: 30 each, Rfl: 0 .  [place] POWD, Take 5 mLs by mouth daily., Disp: , Rfl:  .  Black Currant Seed Oil 500 MG CAPS, Take 500 mg by mouth 2 (two) times daily., Disp: , Rfl:  .  Boswellia Serrata (BOSWELLIA PO), Take 1 capsule by mouth daily., Disp: , Rfl:  .  Charcoal Activated (CHARCOAL PO), Take 1,120 mg by mouth 2 (two) times daily., Disp: , Rfl:  .  Cholecalciferol (VITAMIN D PO), Take 1 capsule by mouth daily., Disp: , Rfl:  .  cholestyramine (QUESTRAN) 4 GM/DOSE powder, Take 1 g by mouth 2 (two) times daily with a meal. , Disp: , Rfl:  .  Creatine POWD, Take 1 Scoop by mouth daily., Disp: , Rfl:  .  diazepam (VALIUM) 5 MG tablet, Take 1 tablet (5 mg total) by mouth every 12 (twelve) hours as needed for  anxiety., Disp: 20 tablet, Rfl: 1 .  ketorolac (TORADOL) 10 MG tablet, Take 1 tablet (10 mg total) by mouth every 6 (six) hours as needed., Disp: 20 tablet, Rfl: 0 .  MAGNESIUM OXIDE PO, Take 5 mLs by mouth 2 (two) times daily., Disp: , Rfl:  .  Melatonin 10 MG TABS, Take 10 mg by mouth at bedtime as needed (sleep)., Disp: , Rfl:  .  OVER THE COUNTER MEDICATION, Take 30 drops by mouth 2 (two) times daily. [place] Kit, Disp: , Rfl:  .  OVER THE COUNTER MEDICATION, Take 1 drop by mouth 2 (two) times daily. BBVII Oil, Disp: , Rfl:  .  OVER THE COUNTER MEDICATION, Take 1 capsule by mouth daily. Curgard, Disp: , Rfl:  .  OVER THE COUNTER MEDICATION, Take 1 capsule by mouth daily. Stephania Root, Disp: , Rfl:  .  valACYclovir (VALTREX) 500 MG tablet, Take 1 tablet (500 mg total) by mouth 2 (two) times daily., Disp: 60 tablet, Rfl: 12 .  VITAMIN K PO, Take 1 capsule by mouth daily., Disp: , Rfl:  .  Zinc Acetate, Oral, (ZINC ACETATE PO), Take 1 capsule by mouth daily., Disp: , Rfl:    Allergies  Allergen Reactions  . Acyclovir And Related Nausea Only and Other (See Comments)    Abdominal pain dizziness.  . Gabapentin Other (See Comments)  Caused symptoms of depression  . Tindamax [Tinidazole]     UNSPECIFIED REACTION   . Sertraline Hcl Other (See Comments)    Paranoia.    Review of Systems:   Pertinent items are noted in the HPI. Otherwise, ROS is negative.  Vitals:   Vitals:   05/30/18 1324  BP: 126/68  Pulse: 86  Temp: 98.2 F (36.8 C)  TempSrc: Oral  SpO2: 100%  Weight: 133 lb 12.8 oz (60.7 kg)  Height: 5' 6"  (1.676 m)     Body mass index is 21.6 kg/m.  Physical Exam:   Physical Exam  Constitutional: She appears well-nourished.  HENT:  Head: Normocephalic and atraumatic.  Eyes: Pupils are equal, round, and reactive to light. EOM are normal.  Neck: Normal range of motion. Neck supple.  Cardiovascular: Normal rate, regular rhythm, normal heart sounds and intact distal  pulses.  Pulmonary/Chest: Effort normal.  Abdominal: Soft.  Musculoskeletal:  Notable for thoracic and lumbar paraspinal spasms. Decreased ROM.  Skin: Skin is warm.  Psychiatric: She has a normal mood and affect. Her behavior is normal.  Nursing note and vitals reviewed.   Assessment and Plan:   Citlalli was seen today for r rib and flank pain.  Diagnoses and all orders for this visit:  Muscle spasm of back  Somatic dysfunction of thoracic region  Numbness and tingling  Because spasms and pain previously improved with OMT, was able to obtain appointment with Dr. Paulla Fore today.  . Reviewed expectations re: course of current medical issues. . Discussed self-management of symptoms. . Outlined signs and symptoms indicating need for more acute intervention. . Patient verbalized understanding and all questions were answered. [facility] Health Maintenance issues including appropriate healthy diet, exercise, and smoking avoidance were discussed with patient. . See orders for this visit as documented in the electronic medical record. . Patient received an After Visit Summary.  Briscoe Deutscher, DO [facility] 06/04/2018

## 2018-06-02 ENCOUNTER — Ambulatory Visit
Admission: RE | Admit: 2018-06-02 | Discharge: 2018-06-02 | Disposition: A | Discharge status: Home / Self Care | Payer: 59 | Source: Ambulatory Visit | Attending: Neurosurgery | Admitting: Neurosurgery

## 2018-06-02 DIAGNOSIS — M5412 Radiculopathy, cervical region: Secondary | ICD-10-CM

## 2018-06-02 DIAGNOSIS — M50221 Other cervical disc displacement at C4-C5 level: Secondary | ICD-10-CM | POA: Diagnosis not present

## 2018-06-04 ENCOUNTER — Encounter: Payer: Self-pay | Admitting: Family Medicine

## 2018-06-05 ENCOUNTER — Encounter: Payer: Self-pay | Admitting: Sports Medicine

## 2018-06-05 ENCOUNTER — Ambulatory Visit (INDEPENDENT_AMBULATORY_CARE_PROVIDER_SITE_OTHER): Payer: 59 | Admitting: Sports Medicine

## 2018-06-05 VITALS — BP 108/72 | HR 55 | Ht 66.0 in | Wt 134.8 lb

## 2018-06-05 DIAGNOSIS — G540 Brachial plexus disorders: Secondary | ICD-10-CM | POA: Diagnosis not present

## 2018-06-05 DIAGNOSIS — M5020 Other cervical disc displacement, unspecified cervical region: Secondary | ICD-10-CM

## 2018-06-05 DIAGNOSIS — M9902 Segmental and somatic dysfunction of thoracic region: Secondary | ICD-10-CM | POA: Diagnosis not present

## 2018-06-05 DIAGNOSIS — M546 Pain in thoracic spine: Secondary | ICD-10-CM

## 2018-06-05 DIAGNOSIS — M9901 Segmental and somatic dysfunction of cervical region: Secondary | ICD-10-CM

## 2018-06-05 DIAGNOSIS — G8929 Other chronic pain: Secondary | ICD-10-CM

## 2018-06-05 DIAGNOSIS — M9908 Segmental and somatic dysfunction of rib cage: Secondary | ICD-10-CM

## 2018-06-05 NOTE — Patient Instructions (Addendum)
Please look into [email or web address]

## 2018-06-12 ENCOUNTER — Other Ambulatory Visit: Payer: Self-pay

## 2018-06-12 ENCOUNTER — Other Ambulatory Visit: Payer: Self-pay | Admitting: Family Medicine

## 2018-06-12 ENCOUNTER — Encounter: Payer: Self-pay | Admitting: Family Medicine

## 2018-06-12 MED FILL — VENTOLIN HFA 90 MCG INHALER: 108 (90 BAS | 25 days supply | Qty: 18 | Fill #0

## 2018-06-14 ENCOUNTER — Ambulatory Visit (INDEPENDENT_AMBULATORY_CARE_PROVIDER_SITE_OTHER): Payer: 59 | Admitting: Sports Medicine

## 2018-06-14 ENCOUNTER — Encounter: Payer: Self-pay | Admitting: Sports Medicine

## 2018-06-14 VITALS — BP 108/70 | HR 60 | Ht 66.0 in | Wt 134.8 lb

## 2018-06-14 DIAGNOSIS — G8929 Other chronic pain: Secondary | ICD-10-CM

## 2018-06-14 DIAGNOSIS — M9904 Segmental and somatic dysfunction of sacral region: Secondary | ICD-10-CM

## 2018-06-14 DIAGNOSIS — R200 Anesthesia of skin: Secondary | ICD-10-CM

## 2018-06-14 DIAGNOSIS — M9905 Segmental and somatic dysfunction of pelvic region: Secondary | ICD-10-CM

## 2018-06-14 DIAGNOSIS — Z981 Arthrodesis status: Secondary | ICD-10-CM | POA: Diagnosis not present

## 2018-06-14 DIAGNOSIS — M546 Pain in thoracic spine: Secondary | ICD-10-CM | POA: Diagnosis not present

## 2018-06-14 DIAGNOSIS — M9908 Segmental and somatic dysfunction of rib cage: Secondary | ICD-10-CM

## 2018-06-14 DIAGNOSIS — M4722 Other spondylosis with radiculopathy, cervical region: Secondary | ICD-10-CM | POA: Diagnosis not present

## 2018-06-14 DIAGNOSIS — M542 Cervicalgia: Secondary | ICD-10-CM | POA: Diagnosis not present

## 2018-06-14 DIAGNOSIS — M62830 Muscle spasm of back: Secondary | ICD-10-CM

## 2018-06-14 DIAGNOSIS — M9903 Segmental and somatic dysfunction of lumbar region: Secondary | ICD-10-CM

## 2018-06-14 DIAGNOSIS — M9902 Segmental and somatic dysfunction of thoracic region: Secondary | ICD-10-CM | POA: Diagnosis not present

## 2018-06-14 DIAGNOSIS — R202 Paresthesia of skin: Secondary | ICD-10-CM

## 2018-06-14 DIAGNOSIS — M47812 Spondylosis without myelopathy or radiculopathy, cervical region: Secondary | ICD-10-CM | POA: Diagnosis not present

## 2018-06-14 DIAGNOSIS — M5030 Other cervical disc degeneration, unspecified cervical region: Secondary | ICD-10-CM | POA: Diagnosis not present

## 2018-06-14 MED FILL — DICLOFENAC SODIUM 75 MG TAB: 75 | 30 days supply | Qty: 60 | Fill #1

## 2018-06-14 NOTE — Assessment & Plan Note (Signed)
Overall she does continue to improve.  Good response to manipulation at last visit as well as today.  Continue with home therapeutic exercises including the previously discussed foundations training including the subscription service recommended to explore.  She is continuing to have good improvements in her day-to-day function

## 2018-06-14 NOTE — Progress Notes (Signed)
Kristie [name]. Kristie [name], [facility] [place]  Kristie [name] - 37 y.o. female MRN [id number]  Date of birth: 1980-08-25  Visit Date: 06/14/2018  PCP: Briscoe Deutscher, DO   Referred by: Briscoe Deutscher, DO   SUBJECTIVE:   Chief Complaint  Patient presents with  . f/u T-spine pain    Responded well to OMT in the past. XR L-spine 11/13/17, XR C-spine 08/30/17.     HPI: Patient is here to follow-up her mid and low back pain as well as follow-up of generalized neck stiffness.  She reports today the pain is only rated as mild and she feels that she continues to improve.  She has been seen by neurosurgery and reassured that the MRI findings are within normal limits.  She is getting some occasional tingling into the right leg but this is minimal and she denies any true numbness.  Continuing to take diclofenac twice a day using topical homeopathic treatments.  Performing daily therapeutic exercises and has been [email or web address] videos suggested at last visit.  REVIEW OF SYSTEMS: She denies any significant nighttime disturbances.  She is not having any significant changes in bowel or bladder.  She is not having any focal weakness.  Overall neck arm and leg symptoms are significantly improved.  HISTORY:  Prior history reviewed and updated per electronic medical record.  Social History   Occupational History  . Occupation: Programmer, multimedia: [employer]    Comment: [employer]  Tobacco Use  . Smoking status: Former Research scientist (life sciences)  . Smokeless tobacco: Former Systems developer    Quit date: 09/17/2003  Substance and Sexual Activity  . Alcohol use: No  . Drug use: No  . 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       Prefers Naturopathic Medicine.       DATA OBTAINED & REVIEWED:   Recent Labs    09/19/17 0840  01/24/18 1037  CALCIUM 9.2 9.6  AST 13  --   ALT 12  --   TSH 2.77  --    Problem  Thoracic Back Pain   No specialty comments available.  OBJECTIVE:  VS:  HT:5\' 6"  (167.6 cm)   WT:134 lb 12.8 oz (61.1 kg)  BMI:21.77    BP:108/70  HR:60bpm  TEMP: ( )  RESP:98 %   PHYSICAL EXAM: Adult female.  No acute distress.  Alert and appropriate.  She has good insight.  Negative straight leg raise bilaterally.  She has good strength throughout all extremities.  She does have a markedly crouched anterior tilt to her thoracic spine anterior head tilt as well.  She does have improved range of motion.  Thoracolumbar junction is markedly tight with double segment rotation.   ASSESSMENT   1. Chronic bilateral thoracic back pain   2. Somatic dysfunction of thoracic region   3. Muscle spasm of back   4. Numbness and tingling   5. Cervicalgia   6. Somatic dysfunction of lumbar region   7. Somatic dysfunction of pelvis region   8. Somatic dysfunction of rib cage region   9. Somatic dysfunction of sacral region     PLAN:  Pertinent additional documentation may be included in corresponding procedure notes, imaging studies, problem based documentation and patient instructions.  Procedures:  Osteopathic manipulation was performed today based on  physical exam findings.  Please see procedure note for further information including Osteopathic Exam findings  Medications:  No orders of the defined types were placed in this encounter.   Discussion/Instructions: Thoracic back pain Overall she does continue to improve.  Good response to manipulation at last visit as well as today.  Continue with home therapeutic exercises including the previously discussed foundations training including the subscription service recommended to explore.  She is continuing to have good improvements in her day-to-day function  THERAPEUTIC EXERCISE: Discussed the foundation of treatment for this condition is physical  therapy and/or daily (5-6 days/week) therapeutic exercises, focusing on core strengthening, coordination, neuromuscular control/reeducation.  Continue previously prescribed home exercise program.  >50% of this 25 minutes minute visit spent in direct patient counseling and/or coordination of care. Discussion was focused on education regarding the in discussing the pathoetiology and anticipated clinical course of the above condition. Overall she does continue to improve.  Discussed once again the psychosomatic component of many of these features and she does agree that some of this is related.   At follow up will plan: to consider repeat osteopathic manipulation Return in about 1 week (around 06/21/2018).          Gerda Diss, [facility] Physician

## 2018-06-14 NOTE — Progress Notes (Signed)
PROCEDURE NOTE : OSTEOPATHIC MANIPULATION The decision today to treat with Osteopathic Manipulative Therapy (OMT) was based on physical exam findings. Verbal consent was obtained following a discussion with the patient regarding the of risks, benefits and potential side effects, including an acute pain flare,post manipulation soreness and need for repeat treatments.     Contraindications to OMT: NONE  Manipulation was performed as below: Regions Treated & Osteopathic Exam Findings  THORACIC SPINE: T4 -7 Neutral, Rotated RIGHT, Sidebent LEFT T9 -10 Neutral, Rotated LEFT, Sidebent RIGHT RIBS: Rib 12 Right  Inhalation dysfunction (depressed/exhaled) LUMBAR SPINE: L4 FRS right (Flexed, Rotated & Sidebent) PELVIS: Right psoas spasm Right anterior innonimate SACRUM: L on L sacral torsion   OMT Techniques Used  . HVLA . muscle energy . myofascial release . articulatory . soft tissue    The patient tolerated the treatment well and reported Improved symptoms following treatment today. Patient was given medications, exercises, stretches and lifestyle modifications per AVS and verbally.

## 2018-06-19 ENCOUNTER — Ambulatory Visit (INDEPENDENT_AMBULATORY_CARE_PROVIDER_SITE_OTHER): Payer: 59 | Admitting: Physical Therapy

## 2018-06-19 ENCOUNTER — Encounter: Payer: Self-pay | Admitting: Physical Therapy

## 2018-06-19 DIAGNOSIS — M542 Cervicalgia: Secondary | ICD-10-CM | POA: Diagnosis not present

## 2018-06-19 DIAGNOSIS — M62830 Muscle spasm of back: Secondary | ICD-10-CM | POA: Diagnosis not present

## 2018-06-20 ENCOUNTER — Encounter: Payer: Self-pay | Admitting: Physical Therapy

## 2018-06-20 NOTE — Therapy (Signed)
[facility] [address] [place], [place], [address] Phone: [phone]   Fax:  [phone]  Physical Therapy Treatment/Re-Eval   Patient Details  Name: Kristie [name] MRN: [id number] Date of Birth: [birth year] Referring Provider (PT): Briscoe Deutscher   Encounter Date: 06/19/2018  PT End of Session - 06/19/18 1528    Visit Number  10    Number of Visits  17    Date for PT Re-Evaluation  07/17/18    Authorization Type  [facility]    PT Start Time  1520    PT Stop Time  1600    PT Time Calculation (min)  40 min    Activity Tolerance  Patient tolerated treatment well;Patient limited by pain    Behavior During Therapy  [facility] for tasks assessed/performed       Past Medical History:  Diagnosis Date  . Anxiety   . Asthma, exercise induced   . Complication of anesthesia    Very sensitive to medicine  . Dysplasia of cervix, high grade CIN 2   . HNP (herniated nucleus pulposus) with myelopathy, cervical   . HPV in female   . Migraines   . Seasonal allergies     Past Surgical History:  Procedure Laterality Date  . ANTERIOR CERVICAL DECOMP/DISCECTOMY FUSION N/A 02/01/2018   Procedure: Anterior cervical decompression/discectomy/fusion Cervical five-six Cervical six-seven;  Surgeon: Jovita Gamma, MD;  Location: [facility];  Service: Neurosurgery;  Laterality: N/A;  . LASER ABLATION OF THE CERVIX  2013   CIN-2  . VAGINAL CYST EXCISED  2001    There were no vitals filed for this visit.  Subjective Assessment - 06/19/18 1528    Subjective  Pt had recent MRI which was negative for other involvement. Pt has seen Neurologist and Sports med for follow up. She has decreased pain since she was last seen. She reports decreased tingling in R UE and decreased pain overall. She does report tightness in bil UT region that is bothersome and painful, she also reports mild tingling into R fingers, and into bil LEs at times. Pt hoping to finalize HEP for  strengthening and to decrease tightness in UT region, willing to progress to d/c with short duration PT.     Limitations  Lifting;House hold activities    Patient Stated Goals  Decreased pain in neck , learn final HEP    Currently in Pain?  Yes    Pain Score  2     Pain Location  Neck    Pain Orientation  Left;Right    Pain Descriptors / Indicators  Aching;Tightness    Pain Type  Acute pain    Pain Onset  More than a month ago    Pain Frequency  Intermittent    Aggravating Factors   sleeping position, increased UE activity    Pain Relieving Factors  heat/ice    Multiple Pain Sites  No         [facility] PT Assessment - 06/20/18 0001      Assessment   Medical Diagnosis  Cervical radiculopathy    Referring Provider (PT)  Briscoe Deutscher    Onset Date/Surgical Date  02/01/18    Hand Dominance  Right    Prior Therapy  yes      Precautions   Precautions  Cervical      Balance Screen   Has the patient fallen in the past 6 months  No      Prior Function   Level of Independence  Independent      Cognition   Overall Cognitive Status  Within Functional Limits for tasks assessed      AROM   Overall AROM Comments  Cervical ROM: WNL/ Shoulders: WNL      Strength   Overall Strength Comments  shoulder: 4+/5 // Scapular: 4/5      Palpation   Palpation comment  Tightness, soreness, and active trigger points in Bil UTs, and L rhomboid region.                    [facility] Adult PT Treatment/Exercise - 06/20/18 0001      Manual Therapy   Manual therapy comments  skilled palpation and monitoring of soft tissue during dry needling    Passive ROM  Stretching for Bil UTs      Neck Exercises: Stretches   Upper Trapezius Stretch  3 reps;30 seconds    Lower Cervical/Upper Thoracic Stretch  3 reps;30 seconds       Trigger Point Dry Needling - 06/20/18 1131    Consent Given?  Yes    Muscles Treated Upper Body  Upper trapezius;Levator scapulae;Rhomboids    Upper Trapezius Response   Twitch reponse elicited;Palpable increased muscle length   L   Levator Scapulae Response  Twitch response elicited;Palpable increased muscle length   L   Rhomboids Response  Twitch response elicited;Palpable increased muscle length   L          PT Education - 06/20/18 1118    Education Details  Reviewed positive outcomes that pt has made, discussed remaining deficits and PT plan for d/c     Person(s) Educated  Patient    Methods  Explanation    Comprehension  Verbalized understanding       PT Short Term Goals - 06/20/18 1132      PT SHORT TERM GOAL #1   Title  Pt to report decreased pain in R cervical region and UE, to 5/10     Time  2    Period  Weeks    Status  Achieved      PT SHORT TERM GOAL #2   Title  Pt to be independent with initial HEP     Time  2    Period  Weeks    Status  Achieved        PT Long Term Goals - 06/20/18 1133      PT LONG TERM GOAL #1   Title  Pt to report decreased pain in bil cervical and UE region to 2/10 with activity     Time  6    Period  Weeks    Status  Partially Met    Target Date  07/17/18      PT LONG TERM GOAL #2   Title  Pt to demo improved cervical flexion ROM, to have only mild to no limitation, and no pain    Baseline  ROM WNL, has mild pain    Time  6    Period  Weeks    Status  Partially Met    Target Date  07/17/18      PT LONG TERM GOAL #3   Title  Pt to be independent with final HEP for shoulder and cervical strengthening.     Time  6    Period  Weeks    Status  On-going    Target Date  07/17/18      PT LONG TERM GOAL #4   Title  Pt  to demo improved muscle tension at cervical region, to be WNL, no pain to palpate cervical and upper back musculature    Time  6    Period  Weeks    Status  On-going    Target Date  07/17/18            Plan - 06/20/18 1120    Clinical Impression Statement  Pt seen today for re-eval. She has soreness, tightness, and hypertonicity of bilateral upper trap region, and  into L rhomboid. Pt with mild weakness in shoulders and scapular region. She has good postural awareness from education at previous sessions. Pt to benefit from PT to improve tightness, soreness, and trigger points in cervical/thoracic region, and to educate and finalize HEP for strengthening. Plan to d/c in 2-4 weeks. Pt in agreement with plan.     Clinical Presentation  Stable    Clinical Decision Making  Low    Rehab Potential  Fair    PT Frequency  2x / week    PT Duration  4 weeks    PT Treatment/Interventions  ADLs/Self Care Home Management;Cryotherapy;Electrical Stimulation;Iontophoresis 53m/ml Dexamethasone;Functional mobility training;Ultrasound;Moist Heat;Therapeutic activities;Therapeutic exercise;Neuromuscular re-education;Patient/family education;Passive range of motion;Manual techniques;Dry needling;Taping    PT Next Visit Plan  Review final HEP for strengthening.     Consulted and Agree with Plan of Care  Patient       Patient will benefit from skilled therapeutic intervention in order to improve the following deficits and impairments:  Decreased range of motion, Increased muscle spasms, Decreased activity tolerance, Pain, Improper body mechanics, Impaired flexibility, Hypomobility, Decreased mobility, Decreased strength  Visit Diagnosis: Cervicalgia - Plan: PT plan of care cert/re-cert  Muscle spasm of back - Plan: PT plan of care cert/re-cert     Problem List Patient Active Problem List   Diagnosis Date Noted  . HNP (herniated nucleus pulposus), cervical 02/01/2018  . Thoracic outlet syndrome 09/07/2017  . Dysesthesia of multiple sites 09/07/2017  . Thoracic back pain 02/18/2017  . Congenital cavus foot 02/18/2017  . History of cervical dysplasia 01/20/2017  . BV (bacterial vaginosis) 12/21/2016  . HSV-1 (herpes simplex virus 1) infection 07/08/2015  . Migraines 08/05/2011  . Allergic rhinitis 06/12/2010  . Asthma 06/05/2010   LLyndee Hensen PT, DPT 11:35 AM   06/20/18    [email or web address] [place] [address]Phone: [phone]  Fax:  [phone] Name: Kristie WILTONMRN: [id number] of Birth: [birth year]  .

## 2018-06-26 ENCOUNTER — Encounter: Payer: Self-pay | Admitting: Sports Medicine

## 2018-06-26 ENCOUNTER — Ambulatory Visit (INDEPENDENT_AMBULATORY_CARE_PROVIDER_SITE_OTHER): Payer: 59 | Admitting: Physical Therapy

## 2018-06-26 ENCOUNTER — Encounter: Payer: Self-pay | Admitting: Physical Therapy

## 2018-06-26 ENCOUNTER — Ambulatory Visit (INDEPENDENT_AMBULATORY_CARE_PROVIDER_SITE_OTHER): Payer: 59 | Admitting: Sports Medicine

## 2018-06-26 VITALS — BP 100/66 | HR 58 | Ht 66.0 in | Wt 135.6 lb

## 2018-06-26 DIAGNOSIS — M9908 Segmental and somatic dysfunction of rib cage: Secondary | ICD-10-CM

## 2018-06-26 DIAGNOSIS — M9903 Segmental and somatic dysfunction of lumbar region: Secondary | ICD-10-CM

## 2018-06-26 DIAGNOSIS — M9901 Segmental and somatic dysfunction of cervical region: Secondary | ICD-10-CM | POA: Diagnosis not present

## 2018-06-26 DIAGNOSIS — M9904 Segmental and somatic dysfunction of sacral region: Secondary | ICD-10-CM

## 2018-06-26 DIAGNOSIS — M9902 Segmental and somatic dysfunction of thoracic region: Secondary | ICD-10-CM

## 2018-06-26 DIAGNOSIS — M542 Cervicalgia: Secondary | ICD-10-CM | POA: Diagnosis not present

## 2018-06-26 DIAGNOSIS — M62830 Muscle spasm of back: Secondary | ICD-10-CM | POA: Diagnosis not present

## 2018-06-26 DIAGNOSIS — M9905 Segmental and somatic dysfunction of pelvic region: Secondary | ICD-10-CM

## 2018-06-26 NOTE — Progress Notes (Signed)
Kristie [name]. Kristie [name], Kristie [name]  Kristie [name] - 37 y.o. female MRN [id number]  Date of birth: 10-Nov-1980  Visit Date: 06/26/2018  PCP: Briscoe Deutscher, DO   Referred by: Briscoe Deutscher, DO   SUBJECTIVE:  Chief Complaint  Patient presents with  . f/u back pain    Taking Diclofenac and doing HEP.    HPI: Patient reports good improvement in her overall mild symptoms at this time for her mid thoracic back pain.  She has been doing quite well following manipulation last visit.  She is been taking diclofenac.  She did come down with a viral illness and is recovering from that well.  She has no significant arm or hand symptoms at this time.  REVIEW OF SYSTEMS: Denies fevers, chills, recent weight gain or weight loss.  No night sweats. No significant nighttime awakenings due to this issue.  HISTORY:  Prior history reviewed and updated per electronic medical record.  Social History   Occupational History  . Occupation: Programmer, multimedia: [employer]    Comment: [employer]  Tobacco Use  . Smoking status: Former Research scientist (life sciences)  . Smokeless tobacco: Former Systems developer    Quit date: 09/17/2003  Substance and Sexual Activity  . Alcohol use: No  . Drug use: No  . 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       Prefers Naturopathic Medicine.       OBJECTIVE:  VS:  HT:5\' 6"  (167.6 cm)   WT:135 lb 9.6 oz (61.5 kg)  BMI:21.9    BP:100/66  HR:(!) 58bpm  TEMP: ( )  RESP:95 %   PHYSICAL EXAM: Adult female. No acute distress.  Alert and appropriate. Good cervical and lumbar range of motion although there are functional limitations. Negative Spurling's compression test and Lhermitte's compression test.   Upper extremity and lower extremity strength is 5/5 in all myotomes. Normal  sensation Significant anterior chain dominant posture.    ASSESSMENT   1. Cervicalgia   2. Muscle spasm of back   3. Somatic dysfunction of cervical region   4. Somatic dysfunction of thoracic region   5. Somatic dysfunction of lumbar region   6. Somatic dysfunction of rib cage region   7. Somatic dysfunction of pelvis region   8. Somatic dysfunction of sacral region     PLAN:  Pertinent additional documentation may be included in corresponding procedure notes, imaging studies, problem based documentation and patient instructions.  Procedures:  Osteopathic manipulation was performed today based on physical exam findings.  Please see procedure note for further information including Osteopathic Exam findings    Medications:  No orders of the defined types were placed in this encounter.   Discussion/Instructions: No problem-specific Assessment & Plan notes found for this encounter.  Discussed the underlying features of tight hip flexors leading to crouched, fetal like position that results in spinal column compression.  Including lumbar hyperflexion with hypermobility, thoracic flexion with restrictive rotation and cervical lordosis reversal.   Osteopathic manipulation was performed today based on physical exam findings.  Patient has responded well to osteopathic manipulation previously the prior manipulation did not provide permanent long lasting relief.  The patient does feel as though there was significant benefit to the prior manipulation and they wished for repeat manipulation today.  They understand  that home therapeutic exercises are critical part of the healing/treatment process and will continue with self treatment between now and their next visit as outlined.  The patient understands that the frequency of visits is meant to provide a stimulus to promote the body's own ability to heal and is not meant to be the sole means for improvement in their symptoms.  At follow up will  plan: repeat osteopathic manipulation   Return in about 3 weeks (around 07/17/2018).          Gerda Diss, [facility] Physician

## 2018-06-26 NOTE — Patient Instructions (Signed)
Access Code: [email or web address]  URL: [email or web address]  Date: 06/26/2018  Prepared by: [name]   Exercises  Seated Cervical Retraction - 10 reps - 2 sets - 2x daily  Seated Cervical Rotation AROM - 10 reps - 2 sets - 2x daily  Seated Shoulder Rolls - 10 reps - 2 sets - 2x daily  Standing Scapular Retraction - 10 reps - 2 sets - 2x daily  Seated Cervical Sidebending Stretch - 3 sets - 30 hold - 2x daily  Scapular Retraction with Resistance - 10 reps - 2 sets - 1x daily  Prone Scapular Retraction Arms at Side - 10 reps - 1 sets - 1x daily  Prone Shoulder Extension - Single Arm - 10 reps - 2 sets - 2x daily  Prone Scapular Retraction and Row - 10 reps - 2 sets - 1x daily  Standing Shoulder Scaption - 10 reps - 2 sets - 1x daily  Standing Shoulder Horizontal Abduction with Resistance - 10 reps - 2 sets - 1x daily  Wall Angels - 10 reps - 1 sets - 1x daily

## 2018-06-26 NOTE — Progress Notes (Signed)
PROCEDURE NOTE : OSTEOPATHIC MANIPULATION The decision today to treat with Osteopathic Manipulative Therapy (OMT) was based on physical exam findings. Verbal consent was obtained following a discussion with the patient regarding the of risks, benefits and potential side effects, including an acute pain flare,post manipulation soreness and need for repeat treatments.     Contraindications to OMT: Recent cervical spine surgery, no HVLA was performed in this region and only gentle soft tissue, articulatory and muscle energy was performed  Manipulation was performed as below: Regions Treated & Osteopathic Exam Findings  OA - rotated right C7 Extended, rotated RIGHT, sidebent RIGHT T2 - 6 Neutral, rotated RIGHT, sidebent LEFT Rib 7 Right  Posterior L4 FRS RIGHT (Flexed, Rotated & Sidebent) Right psoas spasm Right anterior innonimate L on L sacral torsion   OMT Techniques Used  . muscle energy . myofascial release . HVLA - (Not in cervical spine region)    The patient tolerated the treatment well and reported Improved symptoms following treatment today. Patient was given medications, exercises, stretches and lifestyle modifications per AVS and verbally.

## 2018-06-26 NOTE — Therapy (Addendum)
[facility] [address] [place], [place], [address] Phone: [phone]   Fax:  [phone]  Physical Therapy Treatment/Discharge   Patient Details  Name: Kristie [name] MRN: [id number] Date of Birth: [birth year] Referring Provider (PT): Briscoe Deutscher   Encounter Date: 06/26/2018  PT End of Session - 06/26/18 1609    Visit Number  11    Number of Visits  17    Date for PT Re-Evaluation  07/17/18    Authorization Type  [facility]    PT Start Time  1515    PT Stop Time  1603    PT Time Calculation (min)  48 min    Activity Tolerance  Patient tolerated treatment well;Patient limited by pain    Behavior During Therapy  [facility] for tasks assessed/performed       Past Medical History:  Diagnosis Date  . Anxiety   . Asthma, exercise induced   . Complication of anesthesia    Very sensitive to medicine  . Dysplasia of cervix, high grade CIN 2   . HNP (herniated nucleus pulposus) with myelopathy, cervical   . HPV in female   . Migraines   . Seasonal allergies     Past Surgical History:  Procedure Laterality Date  . ANTERIOR CERVICAL DECOMP/DISCECTOMY FUSION N/A 02/01/2018   Procedure: Anterior cervical decompression/discectomy/fusion Cervical five-six Cervical six-seven;  Surgeon: Jovita Gamma, MD;  Location: [facility];  Service: Neurosurgery;  Laterality: N/A;  . LASER ABLATION OF THE CERVIX  2013   CIN-2  . VAGINAL CYST EXCISED  2001    There were no vitals filed for this visit.  Subjective Assessment - 06/26/18 1607    Subjective  Pt states that she is doing much better, feels that muscles on L have loosened up quite a bit, does have some soreness on R, but has been able to increase activity level without pain.     Currently in Pain?  Yes    Pain Score  1     Pain Location  Neck    Pain Orientation  Left;Right    Pain Descriptors / Indicators  Aching;Tightness    Pain Type  Acute pain    Pain Onset  More than a month ago    Pain Frequency  Intermittent                       [facility] Adult PT Treatment/Exercise - 06/26/18 1517      Neck Exercises: Theraband   Rows  20 reps;Blue    Other Theraband Exercises  Scap pull outs RTB x20;       Neck Exercises: Seated   Other Seated Exercise  thoracic rotation x10      Neck Exercises: Prone   Rows  15 reps;Weights    Rows Weights (lbs)  2    Other Prone Exercise  Prone I's, T's x20 ea bil;       Manual Therapy   Manual Therapy  Joint mobilization    Manual therapy comments  skilled palpation and monitoring of soft tissue during dry needling    Joint Mobilization  PA mobs (gr 3) for upper and mid thoracic region.     Passive ROM  Stretching for Bil UTs      Neck Exercises: Stretches   Upper Trapezius Stretch  --    Warehouse manager  --    Lower Cervical/Upper Thoracic Stretch  --       Trigger Point Dry Needling -  06/26/18 1631    Consent Given?  Yes    Muscles Treated Upper Body  Upper trapezius;Levator scapulae    Upper Trapezius Response  Twitch reponse elicited;Palpable increased muscle length    Levator Scapulae Response  Twitch response elicited;Palpable increased muscle length           PT Education - 06/26/18 1608    Education Details  Final HEP reviewed.     Person(s) Educated  Patient    Methods  Explanation;Handout;Verbal cues;Demonstration;Tactile cues    Comprehension  Verbalized understanding       PT Short Term Goals - 06/20/18 1132      PT SHORT TERM GOAL #1   Title  Pt to report decreased pain in R cervical region and UE, to 5/10     Time  2    Period  Weeks    Status  Achieved      PT SHORT TERM GOAL #2   Title  Pt to be independent with initial HEP     Time  2    Period  Weeks    Status  Achieved        PT Long Term Goals - 06/26/18 1609      PT LONG TERM GOAL #1   Title  Pt to report decreased pain in bil cervical and UE region to 2/10 with activity     Time  6    Period  Weeks    Status   Achieved      PT LONG TERM GOAL #2   Title  Pt to demo improved cervical flexion ROM, to have only mild to no limitation, and no pain    Baseline  ROM WNL, has mild pain    Time  6    Period  Weeks    Status  Achieved      PT LONG TERM GOAL #3   Title  Pt to be independent with final HEP for shoulder and cervical strengthening.     Time  6    Period  Weeks    Status  Achieved      PT LONG TERM GOAL #4   Title  Pt to demo improved muscle tension at cervical region, to be WNL, no pain to palpate cervical and upper back musculature    Time  6    Period  Weeks    Status  Achieved            Plan - 06/26/18 1611    Clinical Impression Statement  Pt with muscle tension on L vs R today. Addressed with dry needling to UT. Pt with decreased muscle tension, and has little pain with activity. Final HEP reviewed in detail today for stretching and strengthening. Pt has met goals at this time, and is ready for d/c to HEP.     Rehab Potential  Fair    PT Frequency  2x / week    PT Duration  4 weeks    PT Treatment/Interventions  ADLs/Self Care Home Management;Cryotherapy;Electrical Stimulation;Iontophoresis 47m/ml Dexamethasone;Functional mobility training;Ultrasound;Moist Heat;Therapeutic activities;Therapeutic exercise;Neuromuscular re-education;Patient/family education;Passive range of motion;Manual techniques;Dry needling;Taping    PT Next Visit Plan  Review final HEP for strengthening.     Consulted and Agree with Plan of Care  Patient       Patient will benefit from skilled therapeutic intervention in order to improve the following deficits and impairments:  Decreased range of motion, Increased muscle spasms, Decreased activity tolerance, Pain, Improper body mechanics, Impaired flexibility, Hypomobility,  Decreased mobility, Decreased strength  Visit Diagnosis: Cervicalgia  Muscle spasm of back     Problem List Patient Active Problem List   Diagnosis Date Noted  . HNP  (herniated nucleus pulposus), cervical 02/01/2018  . Thoracic outlet syndrome 09/07/2017  . Dysesthesia of multiple sites 09/07/2017  . Thoracic back pain 02/18/2017  . Congenital cavus foot 02/18/2017  . History of cervical dysplasia 01/20/2017  . BV (bacterial vaginosis) 12/21/2016  . HSV-1 (herpes simplex virus 1) infection 07/08/2015  . Migraines 08/05/2011  . Allergic rhinitis 06/12/2010  . Asthma 06/05/2010   [name], PT, DPT 4:32 PM  06/26/18   [facility] [place], [place], [address] Phone: [phone]   Fax:  [phone]  Name: DAPHINE LOCH MRN: [id number] Date of Birth: 1981-05-18   PHYSICAL THERAPY DISCHARGE SUMMARY  Visits from Start of Care:  11 Plan: Patient agrees to discharge.  Patient goals were met. Patient is being discharged due to meeting the stated rehab goals.  ?????      [name], PT, DPT 4:32 PM  06/26/18

## 2018-07-07 ENCOUNTER — Ambulatory Visit (INDEPENDENT_AMBULATORY_CARE_PROVIDER_SITE_OTHER): Payer: No Typology Code available for payment source | Admitting: Family Medicine

## 2018-07-07 ENCOUNTER — Encounter: Payer: Self-pay | Admitting: Family Medicine

## 2018-07-07 VITALS — BP 110/64 | HR 67 | Temp 98.6°F | Ht 66.0 in | Wt 133.6 lb

## 2018-07-07 DIAGNOSIS — L989 Disorder of the skin and subcutaneous tissue, unspecified: Secondary | ICD-10-CM | POA: Diagnosis not present

## 2018-07-07 DIAGNOSIS — M5412 Radiculopathy, cervical region: Secondary | ICD-10-CM | POA: Diagnosis not present

## 2018-07-07 DIAGNOSIS — M542 Cervicalgia: Secondary | ICD-10-CM | POA: Diagnosis not present

## 2018-07-07 DIAGNOSIS — M9903 Segmental and somatic dysfunction of lumbar region: Secondary | ICD-10-CM

## 2018-07-07 DIAGNOSIS — Z0100 Encounter for examination of eyes and vision without abnormal findings: Secondary | ICD-10-CM

## 2018-07-07 NOTE — Progress Notes (Signed)
Kristie [name] is a 38 y.o. female is here for follow up.  History of Present Illness:   Lonell Grandchild, CMA acting as scribe for Dr. Briscoe Deutscher.   HPI: Patient in office for follow up. She wanted to make sure all referrals are in order for new insurance.   Back pain: She has been seen seen by sports medicine. She has seen some improvement but still having some numbness in legs and pain. Has a follow up with his office in two weeks. She is taking doclofenac 75mg . She had dropped it down to once a day but has been on twice a day due to pain.   I have placed several referrals for patient today. Unfortunately no way to put in referral for [name]. Labs okay to be drawn at [facility] per Dr. Juleen China.   There are no preventive care reminders to display for this patient. Depression screen [facility] 2/9 07/07/2018 02/10/2016  Decreased Interest 1 0  Down, Depressed, Hopeless 1 0  PHQ - 2 Score 2 0  Altered sleeping 1 -  Tired, decreased energy 2 -  Change in appetite 0 -  Feeling bad or failure about yourself  0 -  Trouble concentrating 0 -  Moving slowly or fidgety/restless 0 -  Suicidal thoughts 0 -  PHQ-9 Score 5 -  Difficult doing work/chores Not difficult at all -   PMHx, SurgHx, SocialHx, FamHx, Medications, and Allergies were reviewed in the Visit Navigator and updated as appropriate.   Patient Active Problem List   Diagnosis Date Noted  . HNP (herniated nucleus pulposus), cervical 02/01/2018  . Thoracic outlet syndrome 09/07/2017  . Dysesthesia of multiple sites 09/07/2017  . Thoracic back pain 02/18/2017  . Congenital cavus foot 02/18/2017  . History of cervical dysplasia 01/20/2017  . BV (bacterial vaginosis) 12/21/2016  . HSV-1 (herpes simplex virus 1) infection 07/08/2015  . Migraines 08/05/2011  . Allergic rhinitis 06/12/2010  . Asthma 06/05/2010   Social History   Tobacco Use  . Smoking status: Former Research scientist (life sciences)  . Smokeless tobacco: Former Systems developer    Quit date: 09/17/2003   Substance Use Topics  . Alcohol use: No  . Drug use: No   Current Medications and Allergies:   .  Acetylcysteine (NAC 600) 600 MG CAPS, Take 600 mg by mouth daily., Disp: , Rfl:  .  Ascorbic Acid (VITAMIN C PO), Take 560 mg by mouth 2 (two) times daily., Disp: , Rfl:  .  ASHWAGANDHA PO, Take 450 mg by mouth daily., Disp: , Rfl:  .  Black Currant Seed Oil 500 MG CAPS, Take 500 mg by mouth 2 (two) times daily., Disp: , Rfl:  .  Boswellia Serrata (BOSWELLIA PO), Take 1 capsule by mouth daily., Disp: , Rfl:  .  Cholecalciferol (VITAMIN D PO), Take 1 capsule by mouth daily., Disp: , Rfl:  .  Creatine POWD, Take 1 Scoop by mouth daily., Disp: , Rfl:  .  diazepam (VALIUM) 5 MG tablet, Take 1 tablet (5 mg total) by mouth every 12 (twelve) hours as needed for anxiety., Disp: 20 tablet, Rfl: 1 .  diclofenac (VOLTAREN) 75 MG EC tablet, , Disp: , Rfl: 3 .  ketorolac (TORADOL) 10 MG tablet, Take 1 tablet (10 mg total) by mouth every 6 (six) hours as needed., Disp: 20 tablet, Rfl: 0 .  MAGNESIUM OXIDE PO, Take 5 mLs by mouth 2 (two) times daily., Disp: , Rfl:  .  Melatonin 10 MG TABS, Take 10 mg by mouth  at bedtime as needed (sleep)., Disp: , Rfl:  .  OVER THE COUNTER MEDICATION, Take 1 capsule by mouth daily. Curgard, Disp: , Rfl:  .  valACYclovir (VALTREX) 500 MG tablet, Take 1 tablet (500 mg total) by mouth 2 (two) times daily., Disp: 60 tablet, Rfl: 12 .  VENTOLIN HFA 108 (90 Base) MCG/ACT inhaler, INHALE 2 PUFFS BY MOUTH EVERY 6 HOURS INTO THE LUNGS AS NEEDED FOR WHEEZING OR SHORTNESS OF BREATH., Disp: 18 g, Rfl: 0   Allergies  Allergen Reactions  . Acyclovir And Related Nausea Only and Other (See Comments)    Abdominal pain dizziness.  . Gabapentin Other (See Comments)    Caused symptoms of depression  . Tindamax [Tinidazole]     UNSPECIFIED REACTION   . Sertraline Hcl Other (See Comments)    Paranoia.    Review of Systems   Pertinent items are noted in the HPI. Otherwise, a complete  ROS is negative.  Vitals:   Vitals:   07/07/18 1546  BP: 110/64  Pulse: 67  Temp: 98.6 F (37 C)  TempSrc: Oral  SpO2: 99%  Weight: 133 lb 9.6 oz (60.6 kg)  Height: 5\' 6"  (1.676 m)     Body mass index is 21.56 kg/m.  Physical Exam:   Physical Exam Vitals signs and nursing note reviewed.  HENT:     Head: Normocephalic and atraumatic.  Eyes:     Pupils: Pupils are equal, round, and reactive to light.  Neck:     Musculoskeletal: Normal range of motion and neck supple.  Cardiovascular:     Rate and Rhythm: Normal rate and regular rhythm.     Heart sounds: Normal heart sounds.  Pulmonary:     Effort: Pulmonary effort is normal.  Abdominal:     Palpations: Abdomen is soft.  Skin:    General: Skin is warm.  Psychiatric:        Behavior: Behavior normal.    Assessment and Plan:   Kristie [name] was seen today for follow-up.  Diagnoses and all orders for this visit:  Cervicalgia -     Ambulatory referral to Neurosurgery  Right cervical radiculopathy -     Ambulatory referral to Neurosurgery  Somatic dysfunction of lumbar region -     Ambulatory referral to Chiropractic -     Ambulatory referral to Sports Medicine  Facial lesion -     Ambulatory referral to Dermatology  Eye exam, routine -     Ambulatory referral to Ophthalmology    . Orders and follow up as documented in [facility], reviewed diet, exercise and weight control, cardiovascular risk and specific lipid/LDL goals reviewed, reviewed medications and side effects in detail.  . Reviewed expectations re: course of current medical issues. . Outlined signs and symptoms indicating need for more acute intervention. . Patient verbalized understanding and all questions were answered. . Patient received an After Visit Summary.  CMA served as Education administrator during this visit. History, Physical, and Plan performed by medical provider. The above documentation has been reviewed and is accurate and complete. Briscoe Deutscher,  D.O.  Briscoe Deutscher, DO [place], [facility] 07/09/2018

## 2018-07-09 ENCOUNTER — Encounter: Payer: Self-pay | Admitting: Family Medicine

## 2018-07-17 ENCOUNTER — Encounter: Payer: Self-pay | Admitting: Family Medicine

## 2018-07-18 ENCOUNTER — Other Ambulatory Visit: Payer: Self-pay

## 2018-07-18 DIAGNOSIS — Z1283 Encounter for screening for malignant neoplasm of skin: Secondary | ICD-10-CM

## 2018-07-24 ENCOUNTER — Encounter: Payer: Self-pay | Admitting: Sports Medicine

## 2018-07-24 ENCOUNTER — Ambulatory Visit: Payer: No Typology Code available for payment source | Admitting: Sports Medicine

## 2018-07-24 VITALS — BP 102/74 | HR 57 | Ht 66.0 in | Wt 138.2 lb

## 2018-07-24 DIAGNOSIS — M9904 Segmental and somatic dysfunction of sacral region: Secondary | ICD-10-CM

## 2018-07-24 DIAGNOSIS — M9908 Segmental and somatic dysfunction of rib cage: Secondary | ICD-10-CM

## 2018-07-24 DIAGNOSIS — M9906 Segmental and somatic dysfunction of lower extremity: Secondary | ICD-10-CM

## 2018-07-24 DIAGNOSIS — M9903 Segmental and somatic dysfunction of lumbar region: Secondary | ICD-10-CM

## 2018-07-24 DIAGNOSIS — M9902 Segmental and somatic dysfunction of thoracic region: Secondary | ICD-10-CM

## 2018-07-24 DIAGNOSIS — M9901 Segmental and somatic dysfunction of cervical region: Secondary | ICD-10-CM

## 2018-07-24 DIAGNOSIS — M542 Cervicalgia: Secondary | ICD-10-CM

## 2018-07-24 DIAGNOSIS — M5412 Radiculopathy, cervical region: Secondary | ICD-10-CM | POA: Diagnosis not present

## 2018-07-24 DIAGNOSIS — M9905 Segmental and somatic dysfunction of pelvic region: Secondary | ICD-10-CM

## 2018-07-24 NOTE — Progress Notes (Signed)
Kristie [name]. Kristie [name], Kristie [name] at [employer]  SAPHYRE CILLO - 38 y.o. female MRN [id number]  Date of birth: [birth year]  Visit Date: July 30, 2018  PCP: Briscoe Deutscher, DO   Referred by: Briscoe Deutscher, DO  SUBJECTIVE:  Chief Complaint  Patient presents with  . Lower Back - Follow-up    Taking Diclofenac every other day. Provided with HEP. Has been to PT. MRI L-spine 11/13/2017. MRI T-spine 11/13/2017. Has responded well to OMT.   . Middle Back - Follow-up    Sx improving. No n/t over the past few days. Occasional aching pain and rt-sided rib pain.     HPI: Patient reports overall steady improvement in her symptoms.  She has been able to increase her physical activity and has been riding her bike and performing yoga as well as home therapeutic exercises and foundations training without significant difficulty.  She has had some right lower extremity tingling as of the last several days but this is mild.  Does not awaken her at night.  She did have a fall 2 weeks ago while slipping on carpeted steps with socks.  She reports no significant limitations in her day-to-day activities from this and no significant injury appreciated.  She continues to use diclofenac.  She does report feeling that dealing with her underlying stress has been beneficial as well.  REVIEW OF SYSTEMS: Per HPI   HISTORY:  Prior history reviewed and updated per electronic medical record.  Social History   Occupational History  . Occupation: Programmer, multimedia: [employer]    Comment: [employer]  Tobacco Use  . Smoking status: Former Research scientist (life sciences)  . Smokeless tobacco: Former Systems developer    Quit date: 09/17/2003  Substance and Sexual Activity  . Alcohol use: No  . Drug use: No  . 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       Prefers  Naturopathic Medicine.     OBJECTIVE:  VS:  HT:5\' 6"  (167.6 cm)   WT:138 lb 3.2 oz (62.7 kg)  BMI:22.32    BP:102/74  HR:(!) 57bpm  TEMP: ( )  RESP:99 %   PHYSICAL EXAM: Adult female. No acute distress.  Alert and appropriate. Good cervical and lumbar range of motion although there are functional limitations. Negative Spurling's compression test and Lhermitte's compression test.   Upper extremity lower extremity strength is 5/5 in all myotomes. Normal sensation Significant anterior chain dominant posture.    ASSESSMENT   1. Cervicalgia   2. Right cervical radiculopathy   3. Somatic dysfunction of lower extremity   4. Somatic dysfunction of cervical region   5. Somatic dysfunction of thoracic region   6. Somatic dysfunction of lumbar region   7. Somatic dysfunction of rib cage region   8. Somatic dysfunction of pelvis region   9. Somatic dysfunction of sacral region      PROCEDURES:  PROCEDURE NOTE: OSTEOPATHIC MANIPULATION   The decision today to treat with Osteopathic Manipulative Therapy (OMT) was based on physical exam findings. Verbal consent was obtained following a discussion with the patient regarding the of risks, benefits and potential side effects, including an acute pain flare,post manipulation soreness and need for repeat treatments.  NONE  Manipulation was performed as below:  [facility] Treated & Osteopathic Exam Findings C7 FRS left T2  extended side bent right T4 through T6 rotated left L3 FRS right Left on left sacral torsion Rib 7, posterior right Right anterior innominate Right psoas and piriformis contracture    OMT Techniques Used: muscle energy myofascial release HVLA - (Not in cervical spine region) Soft tissue work    The patient tolerated the treatment well and reported Improved symptoms following treatment today. Patient was given medications, exercises, stretches and lifestyle modifications per AVS and verbally.       PLAN:  Pertinent  additional documentation may be included in corresponding procedure notes, imaging studies, problem based documentation and patient instructions.  Continue previously prescribed home exercise program.   Osteopathic manipulation was performed today based on physical exam findings.  Patient has responded well to osteopathic manipulation previously the prior manipulation did not provide permanent long lasting relief.  The patient does feel as though there was significant benefit to the prior manipulation and they wished for repeat manipulation today.  They understand that home therapeutic exercises are critical part of the healing/treatment process and will continue with self treatment between now and their next visit as outlined.  The patient understands that the frequency of visits is meant to provide a stimulus to promote the body's own ability to heal and is not meant to be the sole means for improvement in their symptoms.  Activity modifications and the importance of avoiding exacerbating activities (limiting pain to no more than a 4 / 10 during or following activity) recommended and discussed.  Discussed red flag symptoms that warrant earlier emergent evaluation and patient voices understanding.  At follow up will plan to consider: repeat osteopathic manipulation  Return in about 4 weeks (around 08/21/2018) for consideration of repeat Osteopathic Manipulation.          Gerda Diss, [place] [facility] Physician

## 2018-07-25 ENCOUNTER — Ambulatory Visit: Payer: No Typology Code available for payment source | Admitting: Women's Health

## 2018-07-25 ENCOUNTER — Encounter: Payer: Self-pay | Admitting: Women's Health

## 2018-07-25 VITALS — BP 124/80

## 2018-07-25 DIAGNOSIS — N898 Other specified noninflammatory disorders of vagina: Secondary | ICD-10-CM | POA: Diagnosis not present

## 2018-07-25 DIAGNOSIS — R350 Frequency of micturition: Secondary | ICD-10-CM

## 2018-07-25 DIAGNOSIS — Z113 Encounter for screening for infections with a predominantly sexual mode of transmission: Secondary | ICD-10-CM | POA: Diagnosis not present

## 2018-07-25 NOTE — Progress Notes (Signed)
38 year old S WF G0 presents with complaint of vaginal pressure, tenderness, irritation, bladder pressure and mild vaginal itching for several weeks.  Denies visible discharge, odor, pain at end of urination or fever.  States vaginal discomfort/pressure started after masturbation.  History of questionable HSV, no outbreaks with mostly urinary discomfort with some relief with Valtrex.  Monthly cycle, currently not sexually active, last partner questionable fidelity.  Has had a problem with mold exposure, treated by integrative health and states is doing better from that standpoint.  2018  LEEP for persistent CIN-1 margins free. 01/2018 cervical fusion doing better.  Started back cycling this month after almost 6 months of no cycling.  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  History of anxiety/depression is in counseling weekly.  Exam: Appears well.  No CVAT.  Abdomen soft mild tenderness in suprapubic area, no tenderness with deep palpation lateral, no rebound or radiation of pain.  External genitalia no erythema, visible lesions, speculum exam no visible discharge, odor, erythema, wet prep negative.  Bimanual no CMT or adnexal tenderness tenderness appears to be higher than uterus. UA: Negative nitrites, negative leukocytes, no WBCs, no RBCs, 0-5 squamous epithelials, no bacteria  Vaginal/bladder pressure  Plan: Reviewed normality of exam, UA and wet prep.  Reassurance given.  Will watch at this time.  GC/Chlamydia culture pending, declines need for HIV, hepatitis or RPR.  Will continue counseling.

## 2018-07-26 LAB — C. TRACHOMATIS/N. GONORRHOEAE RNA
C. trachomatis RNA, TMA: NOT DETECTED
N. gonorrhoeae RNA, TMA: NOT DETECTED

## 2018-07-27 LAB — WET PREP FOR TRICH, YEAST, CLUE

## 2018-07-28 LAB — URINALYSIS, COMPLETE W/RFL CULTURE
Bacteria, UA: NONE SEEN /HPF
Bilirubin Urine: NEGATIVE
Glucose, UA: NEGATIVE
Hgb urine dipstick: NEGATIVE
Hyaline Cast: NONE SEEN /LPF
Ketones, ur: NEGATIVE
Leukocyte Esterase: NEGATIVE
Nitrites, Initial: NEGATIVE
Protein, ur: NEGATIVE
RBC / HPF: NONE SEEN /HPF (ref 0–2)
Specific Gravity, Urine: 1.015 (ref 1.001–1.03)
WBC, UA: NONE SEEN /HPF (ref 0–5)
pH: 7 (ref 5.0–8.0)

## 2018-07-28 LAB — NO CULTURE INDICATED

## 2018-07-30 ENCOUNTER — Encounter: Payer: Self-pay | Admitting: Sports Medicine

## 2018-07-31 MED FILL — DICLOFENAC SODIUM 75 MG TAB: 75 | 30 days supply | Qty: 60 | Fill #2

## 2018-08-01 ENCOUNTER — Encounter: Payer: Self-pay | Admitting: Sports Medicine

## 2018-08-01 NOTE — Progress Notes (Signed)
Kristie [name]. Kristie [name], [place] at [place]  Kristie [name] - 38 y.o. female MRN [id number]  Date of birth: 22-Jun-1981  Visit Date: 12/03/2019February 4, 2020  PCP: Briscoe Deutscher, DO   Referred by: Briscoe Deutscher, DO  SUBJECTIVE:   Chief Complaint  Patient presents with  . f/u R thoracic, rib, and flank pain    Has responded well to OMT in the past, has done PT. She has tried heat, ice, and oral Diclofenac with minimal relief. She has Baclofen that she plans trying. She has been trying to rest and stetch with minimal relief. Some tingling in the R leg, flank, and ribs. Aggrivated by work, sitting at computer.     HPI: Patient is here for follow-up after cervical spine fusion.  She continues to have some pain that is in her mid thoracic region as well as right arm pain.  This is mild in nature.  She has been working with physical therapy and is having difficulty with increased overhead range of motion.  She denies any persistent radicular symptoms.  She has been taking diclofenac for the possible lack of solidification of her cervical spine fusion.  REVIEW OF SYSTEMS: She continues to have some nighttime disturbances but this is mild.  She does have mild right-sided numbness and tingling in her right arm but this is minimal and significantly improved following her fusion. Otherwise 12 point review of systems performed and is negative   HISTORY:  Prior history reviewed and updated per electronic medical record.  Patient Active Problem List   Diagnosis Date Noted  . HNP (herniated nucleus pulposus), cervical 02/01/2018  . Thoracic outlet syndrome 09/07/2017  . Dysesthesia of multiple sites 09/07/2017  . Thoracic back pain 02/18/2017  . Congenital cavus foot 02/18/2017  . History of cervical dysplasia 01/20/2017  . BV (bacterial vaginosis) 12/21/2016  . HSV-1 (herpes simplex virus 1) infection 07/08/2015  . Migraines  08/05/2011    Long-standing history of migraines including prior workup with MRI that was negative for any type of significant lesion.   . Allergic rhinitis 06/12/2010    Overview:  Allergic Rhinitis  10/1 IMO update   . Asthma 06/05/2010    Overview:  Asthma  ICD-10 cut over     Social History   Occupational History  . Occupation: Programmer, multimedia: [employer]    Comment: [employer]  Tobacco Use  . Smoking status: Former Research scientist (life sciences)  . Smokeless tobacco: Former Systems developer    Quit date: 09/17/2003  Substance and Sexual Activity  . Alcohol use: No  . Drug use: No  . 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       Prefers Naturopathic Medicine.     OBJECTIVE:  VS:  HT:5\' 6"  (167.6 cm)   WT:133 lb 12.8 oz (60.7 kg)  BMI:21.61    BP:126/68  HR:86bpm  TEMP: ( )  RESP:100 %   PHYSICAL EXAM: Good cervical and lumbar range of motion although there are functional limitations. She has a well-healed prior surgical incision of her anterior neck from her prior fusion.   Negative Spurling's compression test and Lhermitte's compression test.   Upper extremity lower extremity strength is 5/5 in all myotomes. Normal sensation in upper and lower extremities. Significant anterior chain dominant posture.    ASSESSMENT:  1. HNP (herniated nucleus pulposus), cervical   2. Thoracic outlet syndrome   3. Chronic bilateral thoracic back pain   4. Somatic dysfunction of cervical region   5. Somatic dysfunction of thoracic region   6. Somatic dysfunction of lumbar region   7. Somatic dysfunction of rib cage region     PROCEDURES:  PROCEDURE NOTE : OSTEOPATHIC MANIPULATION The decision today to treat with Osteopathic Manipulative Therapy (OMT) was based on physical exam findings. Verbal consent was obtained following a discussion with the patient regarding  the of risks, benefits and potential side effects, including an acute pain flare,post manipulation soreness and need for repeat treatments.     Contraindications to OMT: recent cervical spine fusion  Manipulation was performed as below: Regions Treated & Osteopathic Exam Findings  CERVICAL SPINE: OA - rotated right C7 Extended, rotated LEFT, sidebent LEFT THORACIC SPINE:  T2 - 5 Neutral, rotated LEFT, sidebent RIGHT T8 - 10 Neutral, rotated RIGHT, sidebent LEFT RIBS:  Rib 7 Right  Posterior   OMT Techniques Used  muscle energy myofascial release soft tissue HVLA - (Not in cervical spine region)    The patient tolerated the treatment well and reported Improved symptoms following treatment today. Patient was given medications, exercises, stretches and lifestyle modifications per AVS and verbally.     PLAN:  Pertinent additional documentation may be included in corresponding procedure notes, imaging studies, problem based documentation and patient instructions.  No problem-specific Assessment & Plan notes found for this encounter.   Ultimately she continues to have significant anterior chain dominance and thoracic mobility restrictions.  Gentle osteopathic manipulation to the cervical spine avoiding any type of thrust maneuvers performed today.  She did respond well to this as well as thrust maneuvers to the thoracic and rib region.  She will be following up with Dr. Sherwood Gambler in coming weeks.  Seems to be healing well but slow.    Continue previously prescribed home exercise program.   Discussed the underlying features of tight hip flexors leading to crouched, fetal like position that results in spinal column compression.  Including lumbar hyperflexion with hypermobility, thoracic flexion with restrictive rotation and cervical lordosis reversal.   Osteopathic manipulation was performed today based on physical exam findings.  Patient has responded well to osteopathic manipulation  previously the prior manipulation did not provide permanent long lasting relief.  The patient does feel as though there was significant benefit to the prior manipulation and they wished for repeat manipulation today.  They understand that home therapeutic exercises are critical part of the healing/treatment process and will continue with self treatment between now and their next visit as outlined.  The patient understands that the frequency of visits is meant to provide a stimulus to promote the body's own ability to heal and is not meant to be the sole means for improvement in their symptoms.  Activity modifications and the importance of avoiding exacerbating activities (limiting pain to no more than a 4 / 10 during or following activity) recommended and discussed.  Discussed red flag symptoms that warrant earlier emergent evaluation and patient voices understanding.  Return in about 2 weeks (around 06/13/2018) for consideration of repeat Osteopathic Manipulation.          Gerda Diss, [place] Physician

## 2018-08-03 NOTE — Progress Notes (Signed)
[name]. Mckade Gurka, [place] at [place]  JOSAPHINE SHIMAMOTO - 38 y.o. female MRN [id number]  Date of birth: 1981-05-12  Visit Date: 06/05/2018  PCP: Briscoe Deutscher, DO   Referred by: Briscoe Deutscher, DO  SUBJECTIVE:   Chief Complaint  Patient presents with  . f/u back/rib/flank pain    Sx show little change. OMT and stretching helped some. She has been taking Diclfenac with minimal relief. Sx worse over the weekend, improves some today. C/o tingling sensation. Tried muscle relaxor with minimal relief.     HPI:  Patient presents with the above symptoms she does report feeling better.    REVIEW OF SYSTEMS:  She continues to have some nighttime disturbances but this is mild and continues to improve She does have persistent numbness and tingling into the right upper extremity which is also slowly improving.   HISTORY:  Prior history reviewed and updated per electronic medical record.  Patient Active Problem List   Diagnosis Date Noted  . HNP (herniated nucleus pulposus), cervical 02/01/2018  . Thoracic outlet syndrome 09/07/2017  . Dysesthesia of multiple sites 09/07/2017  . Thoracic back pain 02/18/2017  . Congenital cavus foot 02/18/2017  . History of cervical dysplasia 01/20/2017  . BV (bacterial vaginosis) 12/21/2016  . HSV-1 (herpes simplex virus 1) infection 07/08/2015  . Migraines 08/05/2011    Long-standing history of migraines including prior workup with MRI that was negative for any type of significant lesion.   . Allergic rhinitis 06/12/2010    Overview:  Allergic Rhinitis  10/1 IMO update   . Asthma 06/05/2010    Overview:  Asthma  ICD-10 cut over     Social History   Occupational History  . Occupation: Programmer, multimedia: [employer]    Comment: [employer]  Tobacco Use  . Smoking status: Former Research scientist (life sciences)  . Smokeless tobacco: Former Systems developer    Quit date: 09/17/2003  Substance and Sexual  Activity  . Alcohol use: No  . Drug use: No  . 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       Prefers Naturopathic Medicine.     OBJECTIVE:  VS:  HT:5\' 6"  (167.6 cm)   WT:134 lb 12.8 oz (61.1 kg)  BMI:21.77    BP:108/72  HR:(!) 55bpm  TEMP: ( )  RESP:99 %   PHYSICAL EXAM: Good cervical and lumbar range of motion although there are functional limitations. Negative Spurling's compression test and Lhermitte's compression test.   Upper extremity lower extremity strength is 5/5 in all myotomes. Normal sensation Significant anterior chain dominant posture. Hands ASSESSMENT:   No diagnosis found.  PROCEDURES:  PROCEDURE NOTE : OSTEOPATHIC MANIPULATION The decision today to treat with Osteopathic Manipulative Therapy (OMT) was based on physical exam findings. Verbal consent was obtained following a discussion with the patient regarding the of risks, benefits and potential side effects, including an acute pain flare, post manipulation soreness and need for repeat treatments.     Contraindications to OMT: recent cervical spine fusion  Manipulation was performed as below: Regions Treated & Osteopathic Exam Findings  CERVICAL SPINE: OA - rotated right C7 Extended, rotated LEFT, sidebent LEFT THORACIC SPINE:  T2 - 5 Neutral, rotated LEFT, sidebent RIGHT T8 - 10 Neutral, rotated RIGHT, sidebent LEFT RIBS:  Rib 7 Right  Posterior  OMT Techniques Used  muscle energy myofascial release soft tissue HVLA - (Not in cervical spine region)    The patient tolerated the treatment well and reported Improved symptoms following treatment today. Patient was given medications, exercises, stretches and lifestyle modifications per AVS and verbally.     PLAN:  Pertinent additional documentation may be included in corresponding procedure notes, imaging studies, problem  based documentation and patient instructions.  Osteopathic manipulation was performed today based on physical exam findings.  Patient has responded well to osteopathic manipulation previously the prior manipulation did not provide permanent long lasting relief.  The patient does feel as though there was significant benefit to the prior manipulation and they wished for repeat manipulation today.  They understand that home therapeutic exercises are critical part of the healing/treatment process and will continue with self treatment between now and their next visit as outlined below.  The patient understands that the frequency of visits is meant to provide a stimulus to promote the body's own ability to heal and is not meant to be the sole means for improvement in their symptoms.  Stress is always been a component to this and we did discuss looking at some physiologic videos to help with dealing with her stress and incorporating her improve muscle movement.  Return in about 1 week (around 06/12/2018).          Gerda Diss, [place] [facility] Physician

## 2018-08-05 ENCOUNTER — Encounter: Payer: Self-pay | Admitting: Sports Medicine

## 2018-08-18 ENCOUNTER — Encounter: Payer: Self-pay | Admitting: Sports Medicine

## 2018-08-18 ENCOUNTER — Ambulatory Visit: Payer: No Typology Code available for payment source | Admitting: Sports Medicine

## 2018-08-18 VITALS — BP 98/68 | HR 58 | Ht 66.0 in | Wt 136.6 lb

## 2018-08-18 DIAGNOSIS — M9905 Segmental and somatic dysfunction of pelvic region: Secondary | ICD-10-CM

## 2018-08-18 DIAGNOSIS — M5412 Radiculopathy, cervical region: Secondary | ICD-10-CM

## 2018-08-18 DIAGNOSIS — M9902 Segmental and somatic dysfunction of thoracic region: Secondary | ICD-10-CM

## 2018-08-18 DIAGNOSIS — M9908 Segmental and somatic dysfunction of rib cage: Secondary | ICD-10-CM

## 2018-08-18 DIAGNOSIS — M9901 Segmental and somatic dysfunction of cervical region: Secondary | ICD-10-CM | POA: Diagnosis not present

## 2018-08-18 DIAGNOSIS — M542 Cervicalgia: Secondary | ICD-10-CM

## 2018-08-18 DIAGNOSIS — M9903 Segmental and somatic dysfunction of lumbar region: Secondary | ICD-10-CM

## 2018-08-18 DIAGNOSIS — M62830 Muscle spasm of back: Secondary | ICD-10-CM

## 2018-08-18 MED FILL — VALACYCLOVIR HCL 500 MG TAB: 500 | 90 days supply | Qty: 180 | Fill #2

## 2018-08-18 NOTE — Progress Notes (Signed)
Kristie [name]. Kristie [name], [place] at [place]  Kristie [name] - 38 y.o. female MRN [id number]  Date of birth: 04-Jun-1981  Visit Date: August 18, 2018  PCP: Briscoe Deutscher, DO   Referred by: Briscoe Deutscher, DO  SUBJECTIVE:  Chief Complaint  Patient presents with  . Neck - Follow-up  . Middle Back - Follow-up  . Lower Back - Follow-up    HPI: Patient is here today for follow-up of her neck middle back pain and right arm pain.  She has had numbness and tingling throughout the entire right upper extremity and in towards the hand but in a nondermatomal distribution.  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  She denies any clicking popping.  Her neck has been tight.  She does feel like the tightness is what is causing the majority of her pain.  Bone stimulator has been recommended but her insurance is not covering this at this time.  She is contemplating starting testosterone therapy through her functional medicine doctor due to a markedly low levels.  She is increased her vitamin D and vitamin K supplementation.  REVIEW OF SYSTEMS: Denies fevers, chills, recent weight gain or weight loss.  No night sweats.  Pt denies any change in bowel or bladder habits, muscle weakness, numbness or falls associated with this pain. She is having nighttime disturbances once again.  HISTORY:  Prior history reviewed and updated per electronic medical record.  Patient Active Problem List   Diagnosis Date Noted  . HNP (herniated nucleus pulposus), cervical 02/01/2018  . Thoracic outlet syndrome 09/07/2017  . Dysesthesia of multiple sites 09/07/2017  . Thoracic back pain 02/18/2017  . Congenital cavus foot 02/18/2017  . History of cervical dysplasia 01/20/2017  . BV (bacterial vaginosis) 12/21/2016  . HSV-1 (herpes simplex  virus 1) infection 07/08/2015  . Migraines 08/05/2011    Long-standing history of migraines including prior workup with MRI that was negative for any type of significant lesion.   . Allergic rhinitis 06/12/2010    Overview:  Allergic Rhinitis  10/1 IMO update   . Asthma 06/05/2010    Overview:  Asthma  ICD-10 cut over     Social History   Occupational History  . Occupation: Programmer, multimedia: [employer]    Comment: [employer]  Tobacco Use  . Smoking status: Former Research scientist (life sciences)  . Smokeless tobacco: Former Systems developer    Quit date: 09/17/2003  Substance and Sexual Activity  . Alcohol use: No  . Drug use: No  . 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       Prefers Naturopathic Medicine.      OBJECTIVE:  VS:  HT:5\' 6"  (167.6 cm)   WT:136 lb 9.6 oz (62 kg)  BMI:22.06    BP:98/68  HR:(!) 58bpm  TEMP: ( )  RESP:99 %   PHYSICAL EXAM: Adult female. No acute distress.  Alert and appropriate. Bilateral upper extremities are well aligned.  Upper extremity strength is intact to manual muscle testing.  She has marked tightness with brachial plexus squeeze that reproduces symptoms into her right arm that is diffuse.  Scalene muscles are in significant spasm with trigger points.  Anterior scapular  positioning.  She does have muscle tenderness throughout the entire mid thoracic and lower lumbar spine.   ASSESSMENT:   1. Right cervical radiculopathy   2. Cervicalgia   3. Somatic dysfunction of cervical region   4. Somatic dysfunction of thoracic region   5. Somatic dysfunction of lumbar region   6. Somatic dysfunction of rib cage region   7. Somatic dysfunction of pelvis region   8. Muscle spasm of back     PROCEDURES:  PROCEDURE NOTE: OSTEOPATHIC MANIPULATION  The decision today to treat with Osteopathic Manipulative Therapy (OMT) was based on  physical exam findings. Verbal consent was obtained following a discussion with the patient regarding the of risks, benefits and potential side effects, including an acute pain flare,post manipulation soreness and need for repeat treatments.   Contraindications to OMT: Status post cervical fusion, no HVLA performed to the cervical spine. Manipulation was performed as below: Regions Treated & Osteopathic Exam Findings CERVICAL SPINE: OA - rotated right C4 - 6 Extended, rotated RIGHT, sidebent RIGHT THORACIC SPINE:  T2 - 10 Neutral, rotated RIGHT, sidebent LEFT RIBS:  Rib 4 Left  Posterior LUMBAR SPINE:  L3 FRS RIGHT (Flexed, Rotated & Sidebent) PELVIS:  Right psoas spasm Right anterior innonimate  OMT Techniques Used: muscle energy myofascial release soft tissue facilitated positional release HVLA - (Not in cervical spine region)  The patient tolerated the treatment well and reported Improved symptoms following treatment today. Patient was given medications, exercises, stretches and lifestyle modifications per AVS and verbally.      PLAN:  Pertinent additional documentation may be included in corresponding procedure notes, imaging studies, problem based documentation and patient instructions.  No problem-specific Assessment & Plan notes found for this encounter.   Unfortunately she has had continued worsening.  This may be partially related to the fall that she had several months ago.  Based on exam she deftly has a component of functional thoracic outlet syndrome as we can reproduce a large part of the symptoms that she has in her hand with added pressure across the brachial plexus.  Working on continue to gain mobility in her thoracic outlet will be helpful.  We did let her try some Kinesiotape placed just superior to the clavicle to the tip of the scapula on the right.  She does report improvements in her symptoms with this.  Continue previously prescribed home exercise program.    Osteopathic manipulation was performed today based on physical exam findings.  Patient has responded well to osteopathic manipulation previously the prior manipulation did not provide permanent long lasting relief.  The patient does feel as though there was significant benefit to the prior manipulation and they wished for repeat manipulation today.  They understand that home therapeutic exercises are critical part of the healing/treatment process and will continue with self treatment between now and their next visit as outlined.  The patient understands that the frequency of visits is meant to provide a stimulus to promote the body's own ability to heal and is not meant to be the sole means for improvement in their symptoms.  Activity modifications and the importance of avoiding exacerbating activities (limiting pain to no more than a 4 / 10 during or following activity) recommended and discussed.  Discussed red flag symptoms that warrant earlier emergent evaluation and patient voices understanding.   No orders of the defined types were placed in this encounter.  Lab Orders  No laboratory test(s) ordered today   Imaging Orders  No imaging studies  ordered today   Referral Orders  No referral(s) requested today    At follow up will plan to consider: repeat osteopathic manipulation  No follow-ups on file.          Gerda Diss, [place] [facility] Physician

## 2018-08-22 ENCOUNTER — Ambulatory Visit: Payer: No Typology Code available for payment source | Admitting: Sports Medicine

## 2018-09-01 ENCOUNTER — Encounter: Payer: Self-pay | Admitting: Sports Medicine

## 2018-09-01 ENCOUNTER — Ambulatory Visit: Payer: No Typology Code available for payment source | Admitting: Sports Medicine

## 2018-09-01 VITALS — BP 100/62 | HR 54 | Ht 66.0 in | Wt 133.2 lb

## 2018-09-01 DIAGNOSIS — M62830 Muscle spasm of back: Secondary | ICD-10-CM

## 2018-09-01 DIAGNOSIS — M9901 Segmental and somatic dysfunction of cervical region: Secondary | ICD-10-CM | POA: Diagnosis not present

## 2018-09-01 DIAGNOSIS — M9902 Segmental and somatic dysfunction of thoracic region: Secondary | ICD-10-CM

## 2018-09-01 DIAGNOSIS — M5412 Radiculopathy, cervical region: Secondary | ICD-10-CM | POA: Diagnosis not present

## 2018-09-01 DIAGNOSIS — M9908 Segmental and somatic dysfunction of rib cage: Secondary | ICD-10-CM

## 2018-09-01 MED ORDER — DIAZEPAM 2 MG PO TABS: 2.0000 mg | ORAL_TABLET | Freq: Three times a day (TID) | ORAL | 0 refills | Status: DC | PRN

## 2018-09-01 MED FILL — diazePAM 2 MG TABS: 2 | 10 days supply | Qty: 30 | Fill #0

## 2018-09-01 NOTE — Progress Notes (Signed)
Kristie [name]. Kristie [name], [place] [facility]  Kristie [name] - 38 y.o. female MRN [id number]  Date of birth: 1980-07-26  Visit Date: 09/01/2018  PCP: Briscoe Deutscher, DO   Referred by: Briscoe Deutscher, DO  SUBJECTIVE:   Chief Complaint  Patient presents with  . Neck - Follow-up  . Middle Back - Follow-up    HPI: Here for follow-up of neck and right greater than left shoulder pain.  She continues to have intermittent flareups in her symptoms she did take Valium several nights ago and had good improvement in her symptoms the following day however subsequently returned to having fairly significant discomfort with numbness radiating into the right upper extremity.  She is used ice on her neck nightly.  Vitamin D and vitamin K have been continued.  She has actually decided against using testosterone supplementation  REVIEW OF SYSTEMS: No significant nighttime awakenings due to this issue. Denies fevers, chills, recent weight gain or weight loss.  No night sweats.  Per HPI  HISTORY:  Prior history reviewed and updated per electronic medical record.  Patient Active Problem List   Diagnosis Date Noted  . HNP (herniated nucleus pulposus), cervical 02/01/2018  . Thoracic outlet syndrome 09/07/2017  . Dysesthesia of multiple sites 09/07/2017  . Thoracic back pain 02/18/2017  . Congenital cavus foot 02/18/2017  . History of cervical dysplasia 01/20/2017  . BV (bacterial vaginosis) 12/21/2016  . HSV-1 (herpes simplex virus 1) infection 07/08/2015  . Migraines 08/05/2011    Long-standing history of migraines including prior workup with MRI that was negative for any type of significant lesion.   . Allergic rhinitis 06/12/2010    Overview:  Allergic Rhinitis  10/1 IMO update   . Asthma 06/05/2010    Overview:  Asthma  ICD-10 cut over     Social History   Occupational History  . Occupation: Programmer, multimedia: [employer]    Comment: [employer]  Tobacco Use  . Smoking status: Former Research scientist (life sciences)  . Smokeless tobacco: Former Systems developer    Quit date: 09/17/2003  Substance and Sexual Activity  . Alcohol use: No  . Drug use: No  . 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       Prefers Naturopathic Medicine.     OBJECTIVE:  VS:  HT:5\' 6"  (167.6 cm)   WT:133 lb 3.2 oz (60.4 kg)  BMI:21.51    BP:100/62  HR:(!) 54bpm  TEMP: ( )  RESP:100 %   PHYSICAL EXAM: Adult female. No acute distress.  Alert and appropriate. Her neck and shoulders have overall good range of motion however right shoulder does have some crepitation with circumduction.  She has right periscapular muscle spasms especially within the posterior scalene, middle scalene and levator scapula.  Her omohyoid on the right is in spasm as well.  She has a negative Spurling's compression test and Lhermitte's compression test.   ASSESSMENT:   1. Right cervical radiculopathy   2. Somatic dysfunction of cervical region   3. Somatic dysfunction of thoracic region   4. Somatic dysfunction of rib cage region   5. Muscle spasm of back     PROCEDURES:  PROCEDURE NOTE : OSTEOPATHIC MANIPULATION The decision today to treat with Osteopathic Manipulative Therapy (OMT) was based on physical exam findings. Verbal consent was  obtained following a discussion with the patient regarding the of risks, benefits and potential side effects, including an acute pain flare,post manipulation soreness and need for repeat treatments. Minimal risk of injury to neurovascular structures with cervical manipulation previously discussed in detailed and reaffirmed today.   Contraindications to OMT: ACDF in winter 2019  Manipulation was performed as below: [facility] Treated & Osteopathic Exam Findings  CERVICAL SPINE: OA - rotated right C7 Extended, rotated LEFT,  sidebent RIGHT THORACIC SPINE:  T2 - 6 Neutral, rotated RIGHT, sidebent LEFT RIBS:  Rib 1 Right  Exhalation dysfunction (elevated/inhaled)   OMT Techniques Used  HVLA muscle energy myofascial release HPL in cervical region was performed to the OA junction only.    The patient tolerated the treatment well and reported Improved symptoms following treatment today. Patient was given medications, exercises, stretches and lifestyle modifications per AVS and verbally.     PLAN:  Pertinent additional documentation may be included in corresponding procedure notes, imaging studies, problem based documentation and patient instructions.  No problem-specific Assessment & Plan notes found for this encounter.   She has continued to have intermittent waxing and waning of symptoms.  I do agree that holding off on testosterone therapy is likely prudent although she does continue to have some issues with hardware incorporation from the ACDF.  She does have markedly tight paraspinal cervical muscles especially within the intrinsic musculature.  She will benefit from time for both decreasing her overall physical and emotional stress levels as well as hopefully from a muscle spasm standpoint.  Prescription for lower dose was provided to that she can try to cut this in half as well to use the lowest effective dose.  She has obtained a bone stimulator to use on her neck and do agree this is likely prudent.  Continue previously prescribed home exercise program.   Osteopathic manipulation was performed today based on physical exam findings.  Patient has responded well to osteopathic manipulation previously the prior manipulation did not provide permanent long lasting relief.  The patient does feel as though there was significant benefit to the prior manipulation and they wished for repeat manipulation today.  They understand that home therapeutic exercises are critical part of the healing/treatment process and  will continue with self treatment between now and their next visit as outlined.  The patient understands that the frequency of visits is meant to provide a stimulus to promote the body's own ability to heal and is not meant to be the sole means for improvement in their symptoms.  Activity modifications and the importance of avoiding exacerbating activities (limiting pain to no more than a 4 / 10 during or following activity) recommended and discussed.  Discussed red flag symptoms that warrant earlier emergent evaluation and patient voices understanding.   Meds ordered this encounter  Medications  . DISCONTD: diazepam (VALIUM) 2 MG tablet    Sig: Take 1 tablet (2 mg total) by mouth every 8 (eight) hours as needed for anxiety.    Dispense:  30 tablet    Refill:  0  . diazepam (VALIUM) 2 MG tablet    Sig: Take 1 tablet (2 mg total) by mouth every 8 (eight) hours as needed for anxiety.    Dispense:  30 tablet    Refill:  0   Lab Orders  No laboratory test(s) ordered today   Imaging Orders  No imaging studies ordered today   Referral Orders  No referral(s) requested today    Return in about  3 weeks (around 09/22/2018) for consideration of repeat Osteopathic Manipulation.          Gerda Diss, [facility] Physician

## 2018-09-22 ENCOUNTER — Encounter: Payer: No Typology Code available for payment source | Admitting: Family Medicine

## 2018-09-22 ENCOUNTER — Ambulatory Visit: Payer: No Typology Code available for payment source | Admitting: Sports Medicine

## 2018-09-25 ENCOUNTER — Encounter: Payer: Self-pay | Admitting: Sports Medicine

## 2018-09-25 ENCOUNTER — Encounter: Payer: Self-pay | Admitting: Women's Health

## 2018-09-25 ENCOUNTER — Encounter: Payer: Self-pay | Admitting: Family Medicine

## 2018-10-08 NOTE — Progress Notes (Addendum)
Virtual Visit via Video   I connected with Kristie [name] by a video enabled telemedicine application and verified that I am speaking with the correct person using two identifiers. Location patient: Home Location provider: [facility], Office Persons participating in the virtual visit: Rilea A Jaanai, Salemi, DO Lonell Grandchild, CMA acting as scribe for Dr. Briscoe Deutscher.   I discussed the limitations of evaluation and management by telemedicine and the availability of in person appointments. The patient expressed understanding and agreed to proceed.  Subjective:   HPI: She is still having some anxiety. More when she goes in town. She has had a hard time with wanting to help out at the hospital for Covid. She just feels like her health is not in a place to help. That was hard decision for her to come to.   Reviewed all precautions and expectations with prevention of Covid-19. Patient works from home daily. She is going out only once every other week.   She had flair up with neck with radiation to both arms. She did some home therapy, stretching and ice. She was informed that the bone growth is not working. She was seen at [facility] and they suggest that she start testosterone therapy to help with the tissue regeneration. Dr. Juleen China reviewed all her research on pellet therapy options. She has been on testosterone cream in the past that did not help for long at all.   She has been treated for yeast over growth with [employer]. She was treated for it in the past. She feels like she is having the same issue now. She wanted recommendations on other treatment options. We have discussed reasons for recurrent yeast. She has recently had her insulin levels checked recently and follows a plant based diet.   She would like to have [place] sign letter stating that there is not anyone in a 15 miles from patient that offered treatment. She will send copy of what it has to have and we will fill out  for her.   ROS: See pertinent positives and negatives per HPI.  Patient Active Problem List   Diagnosis Date Noted  . HNP (herniated nucleus pulposus), cervical 02/01/2018  . Thoracic outlet syndrome 09/07/2017  . Dysesthesia of multiple sites 09/07/2017  . Thoracic back pain 02/18/2017  . Congenital cavus foot 02/18/2017  . History of cervical dysplasia 01/20/2017  . BV (bacterial vaginosis) 12/21/2016  . HSV-1 (herpes simplex virus 1) infection 07/08/2015  . Migraines 08/05/2011  . Allergic rhinitis 06/12/2010  . Asthma 06/05/2010    Social History   Tobacco Use  . Smoking status: Former Research scientist (life sciences)  . Smokeless tobacco: Former Systems developer    Quit date: 09/17/2003  Substance Use Topics  . Alcohol use: No    Current Outpatient Medications:  .  Ascorbic Acid (VITAMIN C PO), Take 560 mg by mouth 2 (two) times daily., Disp: , Rfl:  .  ASHWAGANDHA PO, Take 450 mg by mouth daily., Disp: , Rfl:  .  Cholecalciferol (VITAMIN D PO), Take 1 capsule by mouth daily., Disp: , Rfl:  .  Creatine POWD, Take 1 Scoop by mouth daily., Disp: , Rfl:  .  diazepam (VALIUM) 2 MG tablet, Take 1 tablet (2 mg total) by mouth every 8 (eight) hours as needed for anxiety., Disp: 30 tablet, Rfl: 0 .  diazepam (VALIUM) 5 MG tablet, Take 1 tablet (5 mg total) by mouth every 12 (twelve) hours as needed for anxiety., Disp: 20 tablet, Rfl:  1 .  MAGNESIUM OXIDE PO, Take 5 mLs by mouth 2 (two) times daily., Disp: , Rfl:  .  Melatonin 10 MG TABS, Take 10 mg by mouth at bedtime as needed (sleep)., Disp: , Rfl:  .  valACYclovir (VALTREX) 500 MG tablet, Take 1 tablet (500 mg total) by mouth 2 (two) times daily., Disp: 60 tablet, Rfl: 12 .  VENTOLIN HFA 108 (90 Base) MCG/ACT inhaler, INHALE 2 PUFFS BY MOUTH EVERY 6 HOURS INTO THE LUNGS AS NEEDED FOR WHEEZING OR SHORTNESS OF BREATH., Disp: 18 g, Rfl: 0  Allergies  Allergen Reactions  . Acyclovir And Related Nausea Only and Other (See Comments)    Abdominal pain dizziness.  .  Gabapentin Other (See Comments)    Caused symptoms of depression  . Tindamax [Tinidazole]     UNSPECIFIED REACTION   . Sertraline Hcl Other (See Comments)    Paranoia.     Objective:   VITALS: Per patient if applicable, see vitals. GENERAL: Alert, appears well and in no acute distress. HEENT: Atraumatic, conjunctiva clear, no obvious abnormalities on inspection of external nose and ears. NECK: Normal movements of the head and neck. CARDIOPULMONARY: No increased WOB. Speaking in clear sentences. I:E ratio WNL.  MS: Moves all visible extremities without noticeable abnormality. PSYCH: Pleasant and cooperative, well-groomed. Speech normal rate and rhythm. Affect is appropriate. Insight and judgement are appropriate. Attention is focused, linear, and appropriate.  NEURO: CN grossly intact. Oriented as arrived to appointment on time with no prompting. Moves both UE equally.  SKIN: No obvious lesions, wounds, erythema, or cyanosis noted on face or hands.  Assessment and Plan:   Kristie [name] was seen today for follow-up.  Diagnoses and all orders for this visit:  Yeast vaginitis Comments: Along with dry, flaky scalp and acne. C/w yeast. Patient knows that I do not accept chronic yeast as a Dx. Okay med today. Orders: -     fluconazole (DIFLUCAN) 100 MG tablet; One tab weekly for 4 weeks.  Trapezius muscle spasm Comments: Previously doing well with OMT and dry needling. Will see if Dr. Paulla Fore can offer anything during [place].   HSV infection -     valACYclovir (VALTREX) 500 MG tablet; Take 1 tablet (500 mg total) by mouth 2 (two) times daily.   . Reviewed expectations re: course of current medical issues. . Discussed self-management of symptoms. . Outlined signs and symptoms indicating need for more acute intervention. . Patient verbalized understanding and all questions were answered. Marland Kitchen Health Maintenance issues including appropriate healthy diet, exercise, and smoking avoidance were  discussed with patient. . See orders for this visit as documented in the electronic medical record.  Briscoe Deutscher, DO  Records requested if needed. Time spent: 25 minutes, of which >50% was spent in obtaining information about her symptoms, reviewing her previous labs, evaluations, and treatments, counseling her about her condition (please see the discussed topics above), and developing a plan to further investigate it; she had a number of questions which I addressed.

## 2018-10-09 ENCOUNTER — Other Ambulatory Visit: Payer: Self-pay

## 2018-10-09 ENCOUNTER — Ambulatory Visit (INDEPENDENT_AMBULATORY_CARE_PROVIDER_SITE_OTHER): Payer: No Typology Code available for payment source | Admitting: Family Medicine

## 2018-10-09 ENCOUNTER — Encounter: Payer: Self-pay | Admitting: Family Medicine

## 2018-10-09 VITALS — Ht 66.0 in | Wt 133.0 lb

## 2018-10-09 DIAGNOSIS — B373 Candidiasis of vulva and vagina: Secondary | ICD-10-CM | POA: Diagnosis not present

## 2018-10-09 DIAGNOSIS — M62838 Other muscle spasm: Secondary | ICD-10-CM | POA: Diagnosis not present

## 2018-10-09 DIAGNOSIS — B3731 Acute candidiasis of vulva and vagina: Secondary | ICD-10-CM

## 2018-10-09 DIAGNOSIS — B009 Herpesviral infection, unspecified: Secondary | ICD-10-CM | POA: Diagnosis not present

## 2018-10-09 MED ORDER — FLUCONAZOLE 100 MG PO TABS: ORAL_TABLET | ORAL | 0 refills | Status: DC

## 2018-10-09 MED ORDER — VALACYCLOVIR HCL 500 MG PO TABS: 500.0000 mg | ORAL_TABLET | Freq: Two times a day (BID) | ORAL | 2 refills | Status: DC

## 2018-10-09 MED FILL — FLUCONAZOLE 100 MG TAB: 100 | 28 days supply | Qty: 4 | Fill #0

## 2018-10-09 NOTE — Patient Instructions (Addendum)
In your scalp you can use Zinc shampoo. Head and shoulders is a good one. We are calling in diflucan for you to take once a week for four weeks. Dr Juleen China will get back with you after she gets a chance to look up that other information with you.

## 2018-10-15 ENCOUNTER — Encounter: Payer: Self-pay | Admitting: Family Medicine

## 2018-10-31 ENCOUNTER — Encounter: Payer: Self-pay | Admitting: Family Medicine

## 2018-11-14 MED FILL — VALACYCLOVIR HCL 500 MG TAB: 500 | 90 days supply | Qty: 180 | Fill #3

## 2018-12-17 ENCOUNTER — Encounter: Payer: Self-pay | Admitting: Women's Health

## 2018-12-18 ENCOUNTER — Other Ambulatory Visit: Payer: Self-pay | Admitting: Women's Health

## 2018-12-18 MED ORDER — TERCONAZOLE 0.8 % VA CREA: 1.0000 | TOPICAL_CREAM | Freq: Every day | VAGINAL | 0 refills | Status: DC

## 2018-12-18 MED FILL — TERCONAZOLE 0.8% VAGINAL CR: 0.8 | 3 days supply | Qty: 20 | Fill #0

## 2018-12-22 ENCOUNTER — Encounter: Payer: No Typology Code available for payment source | Admitting: Family Medicine

## 2018-12-28 ENCOUNTER — Other Ambulatory Visit: Payer: Self-pay

## 2019-01-01 ENCOUNTER — Encounter: Payer: Self-pay | Admitting: Women's Health

## 2019-01-01 ENCOUNTER — Other Ambulatory Visit: Payer: Self-pay

## 2019-01-01 ENCOUNTER — Ambulatory Visit (INDEPENDENT_AMBULATORY_CARE_PROVIDER_SITE_OTHER): Payer: No Typology Code available for payment source | Admitting: Women's Health

## 2019-01-01 VITALS — BP 118/78 | Ht 66.0 in | Wt 127.0 lb

## 2019-01-01 DIAGNOSIS — Z113 Encounter for screening for infections with a predominantly sexual mode of transmission: Secondary | ICD-10-CM

## 2019-01-01 DIAGNOSIS — N898 Other specified noninflammatory disorders of vagina: Secondary | ICD-10-CM | POA: Diagnosis not present

## 2019-01-01 DIAGNOSIS — Z01419 Encounter for gynecological examination (general) (routine) without abnormal findings: Secondary | ICD-10-CM | POA: Diagnosis not present

## 2019-01-01 LAB — WET PREP FOR TRICH, YEAST, CLUE

## 2019-01-01 NOTE — Progress Notes (Signed)
Kristie [name] [birth year] [id number]    History:    Presents for annual exam.  Regular monthly cycle, condoms when active.  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  Pap ASCUS with negative high-risk HPV 2019.  Has had problems with allergies, mold exposure, HSV 1.  Long-term history of anxiety and depression does see a counselor, on no medication.  01/2018 cervical neck surgery doing better.  Past medical history, past surgical history, family history and social history were all reviewed and documented in the EPIC chart.  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  ROS:  A ROS was performed and pertinent positives and negatives are included.  Exam:  Vitals:   01/01/19 1531  BP: 118/78  Weight: 127 lb (57.6 kg)  Height: 5\' 6"  (1.676 m)   Body mass index is 20.5 kg/m.   General appearance:  Normal Thyroid:  Symmetrical, normal in size, without palpable masses or nodularity. Respiratory  Auscultation:  Clear without wheezing or rhonchi Cardiovascular  Auscultation:  Regular rate, without rubs, murmurs or gallops  Edema/varicosities:  Not grossly evident Abdominal  Soft,nontender, without masses, guarding or rebound.  Liver/spleen:  No organomegaly noted  Hernia:  None appreciated  Skin  Inspection:  Grossly normal   Breasts: Examined lying and sitting.     Right: Without masses, retractions, discharge or axillary adenopathy.     Left: Without masses, retractions, discharge or axillary adenopathy. Gentitourinary   Inguinal/mons:  Normal without inguinal adenopathy  External genitalia:  Normal  BUS/Urethra/Skene's glands:  Normal  Vagina: Scant white discharge minimal erythema wet prep positive for yeast  cervix:  Normal  Uterus:   normal in size, shape and contour.  Midline and mobile  Adnexa/parametria:     Rt: Without masses or tenderness.   Lt: Without masses or tenderness.  Anus and perineum: Normal  Digital rectal exam: Normal sphincter tone without palpated masses or  tenderness  Assessment/Plan:  38 y.o. WF G0 for annual exam with no GYN complaints.  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  Continue healthy lifestyle of regular exercise, tennis, yoga, meditation.  Counseling as needed.. Valtrex 500 currently taking daily, reviewed option of twice daily for 3 to 5 days as needed.  Prescription given.  Has Terazol prescription at home will use call if further medication needed.  Aware of yeast prevention.  Contraception reviewed, declines will continue condoms if active.  Pap, GC/chlamydia, will have HIV and RPR drawn at primary care office appointment next week.    Huel Cote [facility], 5:37 PM 01/01/2019

## 2019-01-01 NOTE — Patient Instructions (Addendum)
Boric acid 600mg  gel caps  Twice weekly  Health Maintenance, Female Adopting a healthy lifestyle and getting preventive care are important in promoting health and wellness. Ask your health care provider about:  The right schedule for you to have regular tests and exams.  Things you can do on your own to prevent diseases and keep yourself healthy. What should I know about diet, weight, and exercise? Eat a healthy diet   Eat a diet that includes plenty of vegetables, fruits, low-fat dairy products, and lean protein.  Do not eat a lot of foods that are high in solid fats, added sugars, or sodium. Maintain a healthy weight Body mass index (BMI) is used to identify weight problems. It estimates body fat based on height and weight. Your health care provider can help determine your BMI and help you achieve or maintain a healthy weight. Get regular exercise Get regular exercise. This is one of the most important things you can do for your health. Most adults should:  Exercise for at least 150 minutes each week. The exercise should increase your heart rate and make you sweat (moderate-intensity exercise).  Do strengthening exercises at least twice a week. This is in addition to the moderate-intensity exercise.  Spend less time sitting. Even light physical activity can be beneficial. Watch cholesterol and blood lipids Have your blood tested for lipids and cholesterol at 40 years of age, then have this test every 5 years. Have your cholesterol levels checked more often if:  Your lipid or cholesterol levels are high.  You are older than 40 years of age.  You are at high risk for heart disease. What should I know about cancer screening? Depending on your health history and family history, you may need to have cancer screening at various ages. This may include screening for:  Breast cancer.  Cervical cancer.  Colorectal cancer.  Skin cancer.  Lung cancer. What should I know about  heart disease, diabetes, and high blood pressure? Blood pressure and heart disease  High blood pressure causes heart disease and increases the risk of stroke. This is more likely to develop in people who have high blood pressure readings, are of African descent, or are overweight.  Have your blood pressure checked: ? Every 3-5 years if you are 46-71 years of age. ? Every year if you are 25 years old or older. Diabetes Have regular diabetes screenings. This checks your fasting blood sugar level. Have the screening done:  Once every three years after age 76 if you are at a normal weight and have a low risk for diabetes.  More often and at a younger age if you are overweight or have a high risk for diabetes. What should I know about preventing infection? Hepatitis B If you have a higher risk for hepatitis B, you should be screened for this virus. Talk with your health care provider to find out if you are at risk for hepatitis B infection. Hepatitis C Testing is recommended for:  Everyone born from [birth year] through 1967.  Anyone with known risk factors for hepatitis C. Sexually transmitted infections (STIs)  Get screened for STIs, including gonorrhea and chlamydia, if: ? You are sexually active and are younger than 40 years of age. ? You are older than 40 years of age and your health care provider tells you that you are at risk for this type of infection. ? Your sexual activity has changed since you were last screened, and you are at increased risk for  chlamydia or gonorrhea. Ask your health care provider if you are at risk.  Ask your health care provider about whether you are at high risk for HIV. Your health care provider may recommend a prescription medicine to help prevent HIV infection. If you choose to take medicine to prevent HIV, you should first get tested for HIV. You should then be tested every 3 months for as long as you are taking the medicine. Pregnancy  If you are about to  stop having your period (premenopausal) and you may become pregnant, seek counseling before you get pregnant.  Take 400 to 800 micrograms (mcg) of folic acid every day if you become pregnant.  Ask for birth control (contraception) if you want to prevent pregnancy. Osteoporosis and menopause Osteoporosis is a disease in which the bones lose minerals and strength with aging. This can result in bone fractures. If you are 38 years old or older, or if you are at risk for osteoporosis and fractures, ask your health care provider if you should:  Be screened for bone loss.  Take a calcium or vitamin D supplement to lower your risk of fractures.  Be given hormone replacement therapy (HRT) to treat symptoms of menopause. Follow these instructions at home: Lifestyle  Do not use any products that contain nicotine or tobacco, such as cigarettes, e-cigarettes, and chewing tobacco. If you need help quitting, ask your health care provider.  Do not use street drugs.  Do not share needles.  Ask your health care provider for help if you need support or information about quitting drugs. Alcohol use  Do not drink alcohol if: ? Your health care provider tells you not to drink. ? You are pregnant, may be pregnant, or are planning to become pregnant.  If you drink alcohol: ? Limit how much you use to 0-1 drink a day. ? Limit intake if you are breastfeeding.  Be aware of how much alcohol is in your drink. In the [place] one drink equals one 12 oz bottle of beer (355 mL), one 5 oz glass of wine (148 mL), or one 1 oz glass of hard liquor (44 mL). General instructions  Schedule regular health, dental, and eye exams.  Stay current with your vaccines.  Tell your health care provider if: ? You often feel depressed. ? You have ever been abused or do not feel safe at home. Summary  Adopting a healthy lifestyle and getting preventive care are important in promoting health and wellness.  Follow your  health care provider's instructions about healthy diet, exercising, and getting tested or screened for diseases.  Follow your health care provider's instructions on monitoring your cholesterol and blood pressure. This information is not intended to replace advice given to you by your health care provider. Make sure you discuss any questions you have with your health care provider. Document Released: 12/28/2010 Document Revised: 06/07/2018 Document Reviewed: 06/07/2018 Elsevier Patient Education  2020 [employer].

## 2019-01-01 NOTE — Progress Notes (Signed)
lAB

## 2019-01-02 ENCOUNTER — Encounter: Payer: Self-pay | Admitting: Women's Health

## 2019-01-02 LAB — C. TRACHOMATIS/N. GONORRHOEAE RNA
C. trachomatis RNA, TMA: NOT DETECTED
N. gonorrhoeae RNA, TMA: NOT DETECTED

## 2019-01-02 LAB — PAP IG W/ RFLX HPV ASCU

## 2019-01-03 ENCOUNTER — Other Ambulatory Visit: Payer: Self-pay | Admitting: Family Medicine

## 2019-01-03 ENCOUNTER — Ambulatory Visit (INDEPENDENT_AMBULATORY_CARE_PROVIDER_SITE_OTHER): Payer: No Typology Code available for payment source | Admitting: Family Medicine

## 2019-01-03 ENCOUNTER — Other Ambulatory Visit: Payer: Self-pay

## 2019-01-03 ENCOUNTER — Encounter: Payer: Self-pay | Admitting: Family Medicine

## 2019-01-03 VITALS — BP 126/74 | HR 85 | Temp 98.6°F | Ht 66.0 in | Wt 126.8 lb

## 2019-01-03 DIAGNOSIS — E538 Deficiency of other specified B group vitamins: Secondary | ICD-10-CM

## 2019-01-03 DIAGNOSIS — R1011 Right upper quadrant pain: Secondary | ICD-10-CM

## 2019-01-03 DIAGNOSIS — Z113 Encounter for screening for infections with a predominantly sexual mode of transmission: Secondary | ICD-10-CM

## 2019-01-03 DIAGNOSIS — E559 Vitamin D deficiency, unspecified: Secondary | ICD-10-CM | POA: Diagnosis not present

## 2019-01-03 DIAGNOSIS — E60 Dietary zinc deficiency: Secondary | ICD-10-CM

## 2019-01-03 DIAGNOSIS — R5383 Other fatigue: Secondary | ICD-10-CM | POA: Diagnosis not present

## 2019-01-03 DIAGNOSIS — E612 Magnesium deficiency: Secondary | ICD-10-CM

## 2019-01-03 DIAGNOSIS — Z114 Encounter for screening for human immunodeficiency virus [HIV]: Secondary | ICD-10-CM

## 2019-01-03 DIAGNOSIS — Z0000 Encounter for general adult medical examination without abnormal findings: Secondary | ICD-10-CM

## 2019-01-03 NOTE — Progress Notes (Signed)
Subjective:    Kristie [name] is a 38 y.o. female and is here for a comprehensive physical exam.   Current Outpatient Medications:  .  B Complex Vitamins (VITAMIN B COMPLEX PO), Take 1 tablet by mouth daily., Disp: , Rfl:  .  Cholecalciferol (VITAMIN D PO), Take 1 capsule by mouth daily., Disp: , Rfl:  .  diazepam (VALIUM) 2 MG tablet, Take 1 tablet (2 mg total) by mouth every 8 (eight) hours as needed for anxiety., Disp: 30 tablet, Rfl: 0 .  MAGNESIUM OXIDE PO, Take 5 mLs by mouth 2 (two) times daily., Disp: , Rfl:  .  Melatonin 10 MG TABS, Take 10 mg by mouth at bedtime as needed (sleep)., Disp: , Rfl:  .  terconazole (TERAZOL 3) 0.8 % vaginal cream, Place 1 applicator vaginally at bedtime., Disp: 20 g, Rfl: 0 .  valACYclovir (VALTREX) 500 MG tablet, Take 1 tablet (500 mg total) by mouth 2 (two) times daily., Disp: 180 tablet, Rfl: 2 .  VENTOLIN HFA 108 (90 Base) MCG/ACT inhaler, INHALE 2 PUFFS BY MOUTH EVERY 6 HOURS INTO THE LUNGS AS NEEDED FOR WHEEZING OR SHORTNESS OF BREATH., Disp: 18 g, Rfl: 0 .  VITAMIN D-VITAMIN K PO, Take 1 tablet by mouth daily., Disp: , Rfl:   There are no preventive care reminders to display for this patient.  PMHx, SurgHx, SocialHx, Medications, and Allergies were reviewed in the Visit Navigator and updated as appropriate.   Past Medical History:  Diagnosis Date  . Anxiety   . Asthma, exercise induced   . Complication of anesthesia    Very sensitive to medicine  . Dysplasia of cervix, high grade CIN 2   . HNP (herniated nucleus pulposus) with myelopathy, cervical   . HPV in female   . Migraines   . Seasonal allergies      Past Surgical History:  Procedure Laterality Date  . ANTERIOR CERVICAL DECOMP/DISCECTOMY FUSION N/A 02/01/2018   Procedure: Anterior cervical decompression/discectomy/fusion Cervical five-six Cervical six-seven;  Surgeon: Jovita Gamma, MD;  Location: [facility];  Service: Neurosurgery;  Laterality: N/A;  . LASER ABLATION OF THE  CERVIX  2013   CIN-2  . VAGINAL CYST EXCISED  2001     Family History  Problem Relation Age of Onset  . Hypertension Mother   . Breast cancer Maternal Aunt   . Prostate cancer Maternal Uncle   . Hypertension Maternal Grandmother   . Heart disease Maternal Grandmother   . Lung cancer Maternal Grandfather     Social History   Tobacco Use  . Smoking status: Former Research scientist (life sciences)  . Smokeless tobacco: Former Systems developer    Quit date: 09/17/2003  Substance Use Topics  . Alcohol use: No  . Drug use: No    Review of Systems:   Pertinent items are noted in the HPI. Otherwise, ROS is negative.  Objective:   BP 126/74   Pulse 85   Temp 98.6 F (37 C) (Oral)   Ht 5\' 6"  (1.676 m)   Wt 126 lb 12.8 oz (57.5 kg)   LMP 12/23/2018   SpO2 97%   BMI 20.47 kg/m    General appearance: alert, cooperative and appears stated age. Head: normocephalic, without obvious abnormality, atraumatic. Neck: no adenopathy, supple, symmetrical, trachea midline; thyroid not enlarged, symmetric, no tenderness/mass/nodules. Lungs: clear to auscultation bilaterally. Heart: regular rate and rhythm Abdomen: no current ttp, no rib ttp, no deformity noted Extremities: extremities normal, atraumatic, no cyanosis or edema. Skin: skin color, texture, turgor  normal, no rashes or lesions. Lymph: cervical, supraclavicular, and axillary nodes normal; no abnormal inguinal nodes palpated. Neurologic: grossly normal.  Assessment/Plan:   Kristie [name] was seen today for annual exam.  Diagnoses and all orders for this visit:  Routine physical examination  Fatigue, unspecified type -     Zinc -     Vitamin B12 -     Ceruloplasmin -     Magnesium, RBC -     Comprehensive metabolic panel -     CBC with Differential/Platelet -     Lymph Enumeration, Basic & NK Cells  Vitamin D insufficiency -     VITAMIN D 25 Hydroxy (Vit-D Deficiency, Fractures)  Chronic zinc deficiency -     Zinc  Magnesium deficiency -     Magnesium,  RBC  Folate deficiency  RUQ pain Comments: Intermittent. Worse at night. Not positional. No BM changes. No N/V. Has gallbladder.  Orders: -     Comprehensive metabolic panel -     CBC with Differential/Platelet -     US ABDOMEN LIMITED RUQ -     Lipase  Screening for HIV (human immunodeficiency virus) -     HIV Antibody (routine testing w rflx)  Screening for STD (sexually transmitted disease) -     RPR  B12 deficiency -     Vitamin B12   Patient Counseling:   [x]     Nutrition: Stressed importance of moderation in sodium/caffeine intake, saturated fat and cholesterol, caloric balance, sufficient intake of fresh fruits, vegetables, fiber, calcium, iron, and 1 mg of folate supplement per day (for females capable of pregnancy).   [x]      Stressed the importance of regular exercise.    [x]     Substance Abuse: Discussed cessation/primary prevention of tobacco, alcohol, or other drug use; driving or other dangerous activities under the influence; availability of treatment for abuse.    [x]      Injury prevention: Discussed safety belts, safety helmets, smoke detector, smoking near bedding or upholstery.    [x]      Sexuality: Discussed sexually transmitted diseases, partner selection, use of condoms, avoidance of unintended pregnancy  and contraceptive alternatives.    [x]     Dental health: Discussed importance of regular tooth brushing, flossing, and dental visits.   [x]      Health maintenance and immunizations reviewed. Please refer to Health maintenance section.   Briscoe Deutscher, DO [place]

## 2019-01-04 ENCOUNTER — Ambulatory Visit (HOSPITAL_COMMUNITY)
Admission: RE | Admit: 2019-01-04 | Discharge: 2019-01-04 | Disposition: A | Discharge status: Home / Self Care | Payer: No Typology Code available for payment source | Source: Ambulatory Visit | Attending: Family Medicine | Admitting: Family Medicine

## 2019-01-04 ENCOUNTER — Encounter: Payer: Self-pay | Admitting: Family Medicine

## 2019-01-04 DIAGNOSIS — E60 Dietary zinc deficiency: Secondary | ICD-10-CM | POA: Insufficient documentation

## 2019-01-04 DIAGNOSIS — R5383 Other fatigue: Secondary | ICD-10-CM | POA: Diagnosis not present

## 2019-01-04 DIAGNOSIS — E538 Deficiency of other specified B group vitamins: Secondary | ICD-10-CM | POA: Diagnosis present

## 2019-01-04 DIAGNOSIS — Z114 Encounter for screening for human immunodeficiency virus [HIV]: Secondary | ICD-10-CM | POA: Diagnosis present

## 2019-01-04 DIAGNOSIS — Z0000 Encounter for general adult medical examination without abnormal findings: Secondary | ICD-10-CM | POA: Diagnosis present

## 2019-01-04 DIAGNOSIS — Z113 Encounter for screening for infections with a predominantly sexual mode of transmission: Secondary | ICD-10-CM | POA: Diagnosis present

## 2019-01-04 DIAGNOSIS — R1011 Right upper quadrant pain: Secondary | ICD-10-CM | POA: Insufficient documentation

## 2019-01-04 DIAGNOSIS — E559 Vitamin D deficiency, unspecified: Secondary | ICD-10-CM | POA: Diagnosis present

## 2019-01-04 DIAGNOSIS — E612 Magnesium deficiency: Secondary | ICD-10-CM | POA: Diagnosis present

## 2019-01-04 LAB — COMPREHENSIVE METABOLIC PANEL
ALT: 11 U/L (ref 0–35)
AST: 14 U/L (ref 0–37)
Albumin: 4.7 g/dL (ref 3.5–5.2)
Alkaline Phosphatase: 42 U/L (ref 39–117)
BUN: 10 mg/dL (ref 6–23)
CO2: 28 mEq/L (ref 19–32)
Calcium: 9.3 mg/dL (ref 8.4–10.5)
Chloride: 104 mEq/L (ref 96–112)
Creatinine, Ser: 0.84 mg/dL (ref 0.40–1.20)
GFR: 75.1 mL/min (ref >60.00)
Glucose, Bld: 89 mg/dL (ref 70–99)
Potassium: 4.5 mEq/L (ref 3.5–5.1)
Sodium: 139 mEq/L (ref 135–145)
Total Bilirubin: 0.5 mg/dL (ref 0.2–1.2)
Total Protein: 6.9 g/dL (ref 6.0–8.3)

## 2019-01-04 LAB — CBC WITH DIFFERENTIAL/PLATELET
Basophils Absolute: 0.1 10*3/uL (ref 0.0–0.1)
Basophils Relative: 1.2 % (ref 0.0–3.0)
Eosinophils Absolute: 0.1 10*3/uL (ref 0.0–0.7)
Eosinophils Relative: 2 % (ref 0.0–5.0)
HCT: 39.3 % (ref 36.0–46.0)
Hemoglobin: 13.1 g/dL (ref 12.0–15.0)
Lymphocytes Relative: 31.9 % (ref 12.0–46.0)
Lymphs Abs: 1.5 10*3/uL (ref 0.7–4.0)
MCHC: 33.3 g/dL (ref 30.0–36.0)
MCV: 97.9 fl (ref 78.0–100.0)
Monocytes Absolute: 0.3 10*3/uL (ref 0.1–1.0)
Monocytes Relative: 7 % (ref 3.0–12.0)
Neutro Abs: 2.7 10*3/uL (ref 1.4–7.7)
Neutrophils Relative %: 57.9 % (ref 43.0–77.0)
Platelets: 182 10*3/uL (ref 150.0–400.0)
RBC: 4.02 Mil/uL (ref 3.87–5.11)
RDW: 12.9 % (ref 11.5–15.5)
WBC: 4.7 10*3/uL (ref 4.0–10.5)

## 2019-01-04 LAB — LIPASE: Lipase: 57 U/L (ref 11.0–59.0)

## 2019-01-04 LAB — VITAMIN D 25 HYDROXY (VIT D DEFICIENCY, FRACTURES): VITD: 85.18 ng/mL (ref 30.00–100.00)

## 2019-01-04 LAB — VITAMIN B12: Vitamin B-12: 762 pg/mL (ref 211–911)

## 2019-01-05 ENCOUNTER — Other Ambulatory Visit: Payer: Self-pay | Admitting: Physical Therapy

## 2019-01-05 DIAGNOSIS — R1031 Right lower quadrant pain: Secondary | ICD-10-CM

## 2019-01-06 ENCOUNTER — Encounter: Payer: Self-pay | Admitting: Family Medicine

## 2019-01-08 NOTE — Telephone Encounter (Signed)
I spoke with patient and informed her of message Dr. Alcario Drought message.  Patient said ok for labs to be faxed to [facility].

## 2019-01-10 LAB — LYMPH ENUMERATION, BASIC & NK CELLS
%CD19 (Earliest B-cells): 7 % (ref 6–29)
%CD3 (Mature T-Cells): 79 % (ref 57–85)
%CD62: 13 % (ref 4–25)
%CD8 (Cytotoxic/Suppressor): 21 % (ref 12–42)
Absolute CD19: 120 cells/uL (ref 110–660)
Absolute CD3: 1337 cells/uL (ref 840–3060)
Absolute CD4: 908 cells/uL (ref 490–1740)
Absolute CD62: 222 cells/uL (ref 70–760)
CD4 T Helper %: 55 % (ref 30–61)
CD4/CD8 Ratio: 2.59 (ref 0.86–5.00)
CD8 T Cell Abs: 351 cells/uL (ref 180–1170)
Total lymphocyte count: 1695 cells/uL (ref 850–3900)

## 2019-01-10 LAB — CERULOPLASMIN: Ceruloplasmin: 27 mg/dL (ref 18–53)

## 2019-01-10 LAB — HIV ANTIBODY (ROUTINE TESTING W REFLEX): HIV 1&2 Ab, 4th Generation: NONREACTIVE

## 2019-01-10 LAB — RPR: RPR Ser Ql: NONREACTIVE

## 2019-01-10 LAB — MAGNESIUM, RBC

## 2019-01-10 LAB — ZINC

## 2019-01-23 LAB — LYMPH ENUMERATION, BASIC & NK CELLS
%CD19 (Earliest B-cells): 8 % (ref 6–29)
%CD3 (Mature T-Cells): 78 % (ref 57–85)
%CD62: 13 % (ref 4–25)
%CD8 (Cytotoxic/Suppressor): 20 % (ref 12–42)
Absolute CD19: 131 cells/uL (ref 110–660)
Absolute CD3: 1340 cells/uL (ref 840–3060)
Absolute CD4: 938 cells/uL (ref 490–1740)
Absolute CD62: 222 cells/uL (ref 70–760)
CD4 T Helper %: 56 % (ref 30–61)
CD4/CD8 Ratio: 2.72 (ref 0.86–5.00)
CD8 T Cell Abs: 344 cells/uL (ref 180–1170)
Total lymphocyte count: 1709 cells/uL (ref 850–3900)

## 2019-01-23 LAB — HOUSE ACCOUNT TRACKING

## 2019-01-23 LAB — ZINC: Zinc: 65 ug/dL (ref 60–130)

## 2019-01-23 LAB — CERULOPLASMIN: Ceruloplasmin: 27 mg/dL (ref 18–53)

## 2019-01-24 ENCOUNTER — Encounter: Payer: Self-pay | Admitting: Family Medicine

## 2019-01-30 ENCOUNTER — Ambulatory Visit: Payer: No Typology Code available for payment source | Admitting: Family Medicine

## 2019-03-07 MED FILL — VALACYCLOVIR HCL 500 MG TAB: 500 | 90 days supply | Qty: 180 | Fill #0

## 2019-03-22 ENCOUNTER — Encounter: Payer: Self-pay | Admitting: Family Medicine

## 2019-03-27 MED ORDER — DIAZEPAM 2 MG PO TABS: 2.0000 mg | ORAL_TABLET | Freq: Three times a day (TID) | ORAL | 1 refills | Status: AC | PRN

## 2019-04-17 ENCOUNTER — Other Ambulatory Visit: Payer: Self-pay

## 2019-04-18 ENCOUNTER — Encounter: Payer: Self-pay | Admitting: Family Medicine

## 2019-04-18 ENCOUNTER — Ambulatory Visit (INDEPENDENT_AMBULATORY_CARE_PROVIDER_SITE_OTHER): Payer: No Typology Code available for payment source | Admitting: Family Medicine

## 2019-04-18 VITALS — BP 100/60 | HR 52 | Temp 98.3°F | Ht 66.0 in | Wt 130.0 lb

## 2019-04-18 DIAGNOSIS — M542 Cervicalgia: Secondary | ICD-10-CM

## 2019-04-18 DIAGNOSIS — M5020 Other cervical disc displacement, unspecified cervical region: Secondary | ICD-10-CM

## 2019-04-18 DIAGNOSIS — J45990 Exercise induced bronchospasm: Secondary | ICD-10-CM

## 2019-04-18 DIAGNOSIS — Z7689 Persons encountering health services in other specified circumstances: Secondary | ICD-10-CM | POA: Diagnosis not present

## 2019-04-18 DIAGNOSIS — G8929 Other chronic pain: Secondary | ICD-10-CM

## 2019-04-18 MED ORDER — ALBUTEROL SULFATE HFA 108 (90 BASE) MCG/ACT IN AERS: 2.0000 | INHALATION_SPRAY | RESPIRATORY_TRACT | 2 refills | Status: AC | PRN

## 2019-04-18 MED FILL — ALBUTEROL SULFATE HFA 108 (: 108 (90 BAS | 16 days supply | Qty: 18 | Fill #0

## 2019-04-18 NOTE — Progress Notes (Signed)
Kristie [name] is a 38 y.o. female  Chief Complaint  Patient presents with  . Establish Care    HPI: Kristie [name] is a 39 y.o. female here to establish care with our office. Her previous PCP was Dr. Juleen China at [facility].  Pt follows annually with [name]. Dr. Juleen China was agreeable to ordering those labs when provided along with diagnosis code. Lab results get forwarded to [facility]. She also has a nautropath doctor in [place] with whom patient "consults" 1-2x/year.   She has exercise-induced asthma that is triggered in colder temps. She needs refill of albuterol inhaler. Very well controlled.   Specialists: Harlene Salts - pt would like referral to acupuncture - h/o herniated disc and chronic neck pain Neurosurg - Dr. Ovidio Kin Derm - Dr. Danella Sensing Ophthalmology Chiropractor Elon Alas at [place], labs: 12/2018  Last PAP: 12/2018  She takes valium PRN for neck pain.   Past Medical History:  Diagnosis Date  . Anxiety   . Asthma, exercise induced   . Complication of anesthesia    Very sensitive to medicine  . Dysplasia of cervix, high grade CIN 2   . HNP (herniated nucleus pulposus) with myelopathy, cervical   . HPV in female   . Migraines   . Seasonal allergies     Past Surgical History:  Procedure Laterality Date  . ANTERIOR CERVICAL DECOMP/DISCECTOMY FUSION N/A 02/01/2018   Procedure: Anterior cervical decompression/discectomy/fusion Cervical five-six Cervical six-seven;  Surgeon: Jovita Gamma, MD;  Location: [facility];  Service: Neurosurgery;  Laterality: N/A;  . LASER ABLATION OF THE CERVIX  2013   CIN-2  . VAGINAL CYST EXCISED  2001    Social History   Socioeconomic History  . Marital status: Single    Spouse name: Not on file  . Number of children: 0  . Years of education: Not on file  . Highest education level: Not on file  Occupational History  . Occupation: Programmer, multimedia: [employer]: [employer]  Social Needs  . Financial resource strain: Not on file  . Food insecurity    Worry: Not on file    Inability: Not on file  . Transportation needs    Medical: Not on file    Non-medical: Not on file  Tobacco Use  . Smoking status: Former Research scientist (life sciences)  . Smokeless tobacco: Former Systems developer    Quit date: 09/17/2003  Substance and Sexual Activity  . Alcohol use: No  . Drug use: No  . Sexual activity: Yes    Birth control/protection: Condom  Lifestyle  . Physical activity    Days per week: Not on file    Minutes per session: Not on file  . Stress: Not on file  Relationships  . Social Herbalist on phone: Not on file    Gets together: Not on file    Attends religious service: Not on file    Active member of club or organization: Not on file    Attends meetings of clubs or organizations: Not on file    Relationship status: Not on file  . Intimate partner violence    Fear of current or ex partner: Not on file    Emotionally abused: Not on file    Physically abused: Not on file    Forced sexual activity: Not on file  Other Topics Concern  . 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 Involved in [employer].       Prefers Naturopathic Medicine.     Family History  Problem Relation Age of Onset  . Hypertension Mother   . Breast cancer Maternal Aunt   . Prostate cancer Maternal Uncle   . Hypertension Maternal Grandmother   . Heart disease Maternal Grandmother   . Lung cancer Maternal Grandfather       There is no immunization history on file for this patient.  Outpatient Encounter Medications as of 04/18/2019  Medication Sig  . B Complex Vitamins (VITAMIN B COMPLEX PO) Take 1 tablet by mouth daily.  . Cholecalciferol (VITAMIN D PO) Take 1 capsule by mouth daily.  . diazepam (VALIUM) 2 MG tablet Take 1 tablet (2 mg total) by mouth every 8 (eight) hours as needed for anxiety.  Marland Kitchen MAGNESIUM OXIDE PO Take 5 mLs  by mouth 2 (two) times daily.  . Melatonin 10 MG TABS Take 10 mg by mouth at bedtime as needed (sleep).  . valACYclovir (VALTREX) 500 MG tablet Take 1 tablet (500 mg total) by mouth 2 (two) times daily.  . VENTOLIN HFA 108 (90 Base) MCG/ACT inhaler INHALE 2 PUFFS BY MOUTH EVERY 6 HOURS INTO THE LUNGS AS NEEDED FOR WHEEZING OR SHORTNESS OF BREATH.  Marland Kitchen VITAMIN D-VITAMIN K PO Take 1 tablet by mouth daily.  . [DISCONTINUED] terconazole (TERAZOL 3) 0.8 % vaginal cream Place 1 applicator vaginally at bedtime.   No facility-administered encounter medications on file as of 04/18/2019.      ROS: Pertinent positives and negatives noted in HPI. Remainder of ROS non-contributory    Allergies  Allergen Reactions  . Acyclovir And Related Nausea Only and Other (See Comments)    Abdominal pain dizziness.  . Gabapentin Other (See Comments)    Caused symptoms of depression  . Tindamax [Tinidazole]     UNSPECIFIED REACTION   . Sertraline Hcl Other (See Comments)    Paranoia.     BP 100/60   Pulse (!) 52   Temp 98.3 F (36.8 C) (Tympanic)   Ht 5\' 6"  (1.676 m)   Wt 130 lb (59 kg)   SpO2 96%   BMI 20.98 kg/m   Physical Exam  Constitutional: She is oriented to person, place, and time. She appears well-developed and well-nourished. No distress.  Cardiovascular: Normal rate, regular rhythm and normal heart sounds.  Pulmonary/Chest: Effort normal and breath sounds normal. No respiratory distress.  Neurological: She is alert and oriented to person, place, and time.  Psychiatric: She has a normal mood and affect. Her behavior is normal.     A/P:  1. Encounter to establish care with new doctor - due for CPE in 12/2019  2. Exercise-induced asthma - stable, well-controlled Refill: - albuterol MDI  3. Chronic neck pain 4. HNP (herniated nucleus pulposus), cervical - pt requests referral to acupuncturist Harlene Salts

## 2019-04-19 NOTE — Telephone Encounter (Signed)
Pt said to disregard see other message.

## 2019-05-14 ENCOUNTER — Ambulatory Visit: Payer: No Typology Code available for payment source | Admitting: Osteopathic Medicine

## 2019-06-29 HISTORY — PX: COLONOSCOPY: SHX174

## 2019-06-29 HISTORY — PX: UPPER GASTROINTESTINAL ENDOSCOPY: SHX188

## 2019-07-09 ENCOUNTER — Ambulatory Visit: Payer: No Typology Code available for payment source | Attending: Internal Medicine

## 2019-07-09 DIAGNOSIS — Z20822 Contact with and (suspected) exposure to covid-19: Secondary | ICD-10-CM

## 2019-07-11 LAB — NOVEL CORONAVIRUS, NAA: SARS-CoV-2, NAA: NOT DETECTED

## 2019-07-30 ENCOUNTER — Encounter: Payer: Self-pay | Admitting: Family Medicine

## 2019-07-30 ENCOUNTER — Telehealth: Payer: Self-pay | Admitting: Family Medicine

## 2019-07-30 DIAGNOSIS — B379 Candidiasis, unspecified: Secondary | ICD-10-CM

## 2019-07-30 DIAGNOSIS — E559 Vitamin D deficiency, unspecified: Secondary | ICD-10-CM

## 2019-07-30 DIAGNOSIS — R5383 Other fatigue: Secondary | ICD-10-CM

## 2019-07-30 DIAGNOSIS — E039 Hypothyroidism, unspecified: Secondary | ICD-10-CM

## 2019-07-30 NOTE — Telephone Encounter (Signed)
Please see message and advise.  Thank you. ° °

## 2019-07-30 NOTE — Telephone Encounter (Signed)
error 

## 2019-08-06 ENCOUNTER — Other Ambulatory Visit: Payer: Self-pay

## 2019-08-07 ENCOUNTER — Other Ambulatory Visit (INDEPENDENT_AMBULATORY_CARE_PROVIDER_SITE_OTHER): Payer: No Typology Code available for payment source

## 2019-08-07 DIAGNOSIS — R5383 Other fatigue: Secondary | ICD-10-CM

## 2019-08-07 DIAGNOSIS — E559 Vitamin D deficiency, unspecified: Secondary | ICD-10-CM

## 2019-08-07 DIAGNOSIS — B379 Candidiasis, unspecified: Secondary | ICD-10-CM

## 2019-08-07 DIAGNOSIS — E039 Hypothyroidism, unspecified: Secondary | ICD-10-CM

## 2019-08-08 LAB — HEMOGLOBIN A1C
Est. average glucose Bld gHb Est-mCnc: 103 mg/dL
Hgb A1c MFr Bld: 5.2 % (ref 4.8–5.6)

## 2019-08-08 LAB — CBC
Hematocrit: 37.5 % (ref 34.0–46.6)
Hemoglobin: 12.3 g/dL (ref 11.1–15.9)
MCH: 31.1 pg (ref 26.6–33.0)
MCHC: 32.8 g/dL (ref 31.5–35.7)
MCV: 95 fL (ref 79–97)
Platelets: 174 10*3/uL (ref 150–450)
RBC: 3.95 x10E6/uL (ref 3.77–5.28)
RDW: 12 % (ref 11.7–15.4)
WBC: 4.3 10*3/uL (ref 3.4–10.8)

## 2019-08-08 LAB — T3, FREE: T3, Free: 2.6 pg/mL (ref 2.0–4.4)

## 2019-08-08 LAB — VITAMIN B12: Vitamin B-12: 1086 pg/mL (ref 232–1245)

## 2019-08-08 LAB — ESTRADIOL: Estradiol: 116 pg/mL

## 2019-08-09 LAB — COMPREHENSIVE METABOLIC PANEL
ALT: 17 IU/L (ref 0–32)
AST: 20 IU/L (ref 0–40)
Albumin/Globulin Ratio: 2 (ref 1.2–2.2)
Albumin: 4.3 g/dL (ref 3.8–4.8)
Alkaline Phosphatase: 53 IU/L (ref 39–117)
BUN/Creatinine Ratio: 10 (ref 9–23)
BUN: 8 mg/dL (ref 6–24)
Bilirubin Total: 0.5 mg/dL (ref 0.0–1.2)
CO2: 18 mmol/L — ABNORMAL LOW (ref 20–29)
Calcium: 8.9 mg/dL (ref 8.7–10.2)
Chloride: 106 mmol/L (ref 96–106)
Creatinine, Ser: 0.84 mg/dL (ref 0.57–1.00)
GFR calc Af Amer: 101 mL/min/{1.73_m2} (ref >59)
GFR calc non Af Amer: 87 mL/min/{1.73_m2} (ref >59)
Globulin, Total: 2.2 g/dL (ref 1.5–4.5)
Glucose: 83 mg/dL (ref 65–99)
Potassium: 3.9 mmol/L (ref 3.5–5.2)
Sodium: 140 mmol/L (ref 134–144)
Total Protein: 6.5 g/dL (ref 6.0–8.5)

## 2019-08-09 LAB — TESTOSTERONE: Testosterone: 23 ng/dL (ref 8–48)

## 2019-08-09 LAB — T4, FREE: Free T4: 1.03 ng/dL (ref 0.82–1.77)

## 2019-08-09 LAB — TSH: TSH: 1.34 u[IU]/mL (ref 0.450–4.500)

## 2019-08-09 LAB — ZINC: Zinc: 82 ug/dL (ref 44–115)

## 2019-08-09 LAB — C-REACTIVE PROTEIN: CRP: <1 mg/L (ref 0–10)

## 2019-08-09 LAB — CORTISOL: Cortisol: 18.7 ug/dL

## 2019-08-10 LAB — MAGNESIUM, RBC: Magnesium RBC: 5.7 mg/dL (ref 4.2–6.8)

## 2019-08-12 LAB — PREGNENOLONE: Pregnenolone: 87 ng/dL

## 2019-08-14 MED FILL — diazePAM 2 MG TABS: 2 | 10 days supply | Qty: 30 | Fill #1

## 2019-08-14 MED FILL — VALACYCLOVIR HCL 500 MG TAB: 500 | 90 days supply | Qty: 180 | Fill #1

## 2019-08-16 NOTE — Progress Notes (Signed)
Please fax results to [facility] [phone] to the attn of Jettie Pagan

## 2019-08-19 LAB — VITAMIN D 25 HYDROXY (VIT D DEFICIENCY, FRACTURES): Vit D, 25-Hydroxy: 67.1 ng/mL (ref 30.0–100.0)

## 2019-08-19 LAB — SPECIMEN STATUS REPORT

## 2019-08-19 LAB — CERULOPLASMIN: Ceruloplasmin: 21.8 mg/dL (ref 19.0–39.0)

## 2019-08-19 LAB — DHEA: Dehydroepiandrosterone: 439 ng/dL (ref 31–701)

## 2019-08-19 LAB — FERRITIN: Ferritin: 36 ng/mL (ref 15–150)

## 2019-08-30 ENCOUNTER — Encounter: Payer: Self-pay | Admitting: Family Medicine

## 2019-09-14 ENCOUNTER — Encounter: Payer: Self-pay | Admitting: Family Medicine

## 2019-09-14 MED ORDER — DICLOFENAC SODIUM 75 MG PO TBEC: 75.0000 mg | DELAYED_RELEASE_TABLET | Freq: Two times a day (BID) | ORAL | 1 refills | Status: DC

## 2019-09-14 MED FILL — DICLOFENAC SODIUM 75 MG TAB: 75 | 30 days supply | Qty: 60 | Fill #0

## 2019-09-20 NOTE — Telephone Encounter (Signed)
I called pt and scheduled her to be seen.

## 2019-09-26 ENCOUNTER — Telehealth (INDEPENDENT_AMBULATORY_CARE_PROVIDER_SITE_OTHER): Payer: No Typology Code available for payment source | Admitting: Family Medicine

## 2019-09-26 ENCOUNTER — Encounter: Payer: Self-pay | Admitting: Family Medicine

## 2019-09-26 VITALS — BP 125/82 | HR 58 | Temp 96.6°F | Resp 14 | Ht 66.0 in

## 2019-09-26 DIAGNOSIS — M5020 Other cervical disc displacement, unspecified cervical region: Secondary | ICD-10-CM

## 2019-09-26 DIAGNOSIS — R200 Anesthesia of skin: Secondary | ICD-10-CM | POA: Diagnosis not present

## 2019-09-26 DIAGNOSIS — G540 Brachial plexus disorders: Secondary | ICD-10-CM

## 2019-09-26 DIAGNOSIS — R202 Paresthesia of skin: Secondary | ICD-10-CM

## 2019-09-26 DIAGNOSIS — M5414 Radiculopathy, thoracic region: Secondary | ICD-10-CM | POA: Diagnosis not present

## 2019-09-26 NOTE — Patient Instructions (Signed)
There are no preventive care reminders to display for this patient.  Depression screen [facility] 2/9 07/07/2018 02/10/2016  Decreased Interest 1 0  Down, Depressed, Hopeless 1 0  PHQ - 2 Score 2 0  Altered sleeping 1 -  Tired, decreased energy 2 -  Change in appetite 0 -  Feeling bad or failure about yourself  0 -  Trouble concentrating 0 -  Moving slowly or fidgety/restless 0 -  Suicidal thoughts 0 -  PHQ-9 Score 5 -  Difficult doing work/chores Not difficult at all -

## 2019-09-26 NOTE — Progress Notes (Signed)
Virtual Visit via Video Note  I connected with Kristie [name] on 09/26/19 at  8:00 AM EDT by a video enabled telemedicine application and verified that I am speaking with the correct person using two identifiers. Location patient: home Location provider: work or home office Persons participating in the virtual visit: patient, provider  I discussed the limitations of evaluation and management by telemedicine and the availability of in person appointments. The patient expressed understanding and agreed to proceed.  Chief Complaint  Patient presents with  . Abdominal Pain    Pt c/o rib pain, both sides, RUQ pain and extreamity pain.    HPI: Kristie [name] is a 39 y.o. female  Pt had anterior cervical decompression/discectomy fusion in 01/2018. Neurosurg - Dr. Sherwood Gambler. Pt is having recurrent issues with pain in extremities, thoracic region. Needs new referral to f/u with Dr. Sherwood Gambler.   Treated for H. Pylori by [name] - 6 wks of herbs/natural therapy   Past Medical History:  Diagnosis Date  . Anxiety   . Asthma, exercise induced   . Complication of anesthesia    Very sensitive to medicine  . Dysplasia of cervix, high grade CIN 2   . HNP (herniated nucleus pulposus) with myelopathy, cervical   . HPV in female   . Migraines   . Seasonal allergies     Past Surgical History:  Procedure Laterality Date  . ANTERIOR CERVICAL DECOMP/DISCECTOMY FUSION N/A 02/01/2018   Procedure: Anterior cervical decompression/discectomy/fusion Cervical five-six Cervical six-seven;  Surgeon: Jovita Gamma, MD;  Location: [facility] [facility];  Service: Neurosurgery;  Laterality: N/A;  . LASER ABLATION OF THE CERVIX  2013   CIN-2  . VAGINAL CYST EXCISED  2001    Family History  Problem Relation Age of Onset  . Hypertension Mother   . Breast cancer Maternal Aunt   . Prostate cancer Maternal Uncle   . Hypertension Maternal Grandmother   . Heart disease Maternal Grandmother   . Lung cancer Maternal  Grandfather     Social History   Tobacco Use  . Smoking status: Former Research scientist (life sciences)  . Smokeless tobacco: Former Systems developer    Quit date: 09/17/2003  Substance Use Topics  . Alcohol use: No  . Drug use: No     Current Outpatient Medications:  .  albuterol (VENTOLIN HFA) 108 (90 Base) MCG/ACT inhaler, Inhale 2 puffs into the lungs every 4 (four) hours as needed for wheezing or shortness of breath., Disp: 18 g, Rfl: 2 .  B Complex Vitamins (VITAMIN B COMPLEX PO), Take 1 tablet by mouth daily., Disp: , Rfl:  .  Cholecalciferol (VITAMIN D PO), Take 1 capsule by mouth daily., Disp: , Rfl:  .  diazepam (VALIUM) 2 MG tablet, Take 1 tablet (2 mg total) by mouth every 8 (eight) hours as needed for anxiety., Disp: 30 tablet, Rfl: 1 .  diclofenac (VOLTAREN) 75 MG EC tablet, Take 1 tablet (75 mg total) by mouth 2 (two) times daily., Disp: 60 tablet, Rfl: 1 .  MAGNESIUM OXIDE PO, Take 5 mLs by mouth 2 (two) times daily., Disp: , Rfl:  .  Melatonin 10 MG TABS, Take 10 mg by mouth at bedtime as needed (sleep)., Disp: , Rfl:  .  valACYclovir (VALTREX) 500 MG tablet, Take 1 tablet (500 mg total) by mouth 2 (two) times daily., Disp: 180 tablet, Rfl: 2 .  VITAMIN D-VITAMIN K PO, Take 1 tablet by mouth daily., Disp: , Rfl:   Allergies  Allergen Reactions  . Acyclovir And Related  Nausea Only and Other (See Comments)    Abdominal pain dizziness.  . Gabapentin Other (See Comments)    Caused symptoms of depression  . Tindamax [Tinidazole]     UNSPECIFIED REACTION   . Sertraline Hcl Other (See Comments)    Paranoia.       ROS: See pertinent positives and negatives per HPI.   EXAM:  VITALS per patient if applicable: BP 99991111 (BP Location: Left Arm, Patient Position: Sitting, Cuff Size: Normal)   Pulse (!) 58   Temp (!) 96.6 F (35.9 C) (Oral)   Resp 14   Ht 5\' 6"  (1.676 m)   LMP 09/16/2019   SpO2 97%   BMI 20.98 kg/m   BP Readings from Last 3 Encounters:  09/26/19 125/82  04/18/19 100/60   01/03/19 126/74    GENERAL: alert, oriented, appears well and in no acute distress  HEENT: atraumatic, conjunctiva clear, no obvious abnormalities on inspection of external nose and ears  NECK: normal movements of the head and neck  LUNGS: on inspection no signs of respiratory distress, breathing rate appears normal, no obvious gross SOB, gasping or wheezing, no conversational dyspnea  CV: no obvious cyanosis  MS: moves all visible extremities without noticeable abnormality  PSYCH/NEURO: pleasant and cooperative, no obvious depression or anxiety, speech and thought processing grossly intact   ASSESSMENT AND PLAN: 1. Thoracic outlet syndrome 2. HNP (herniated nucleus pulposus), cervical 3. Numbness and tingling 4. Radicular pain of thoracic region - Ambulatory referral to Neurosurgery    I discussed the assessment and treatment plan with the patient. The patient was provided an opportunity to ask questions and all were answered. The patient agreed with the plan and demonstrated an understanding of the instructions.   The patient was advised to call back or seek an in-person evaluation if the symptoms worsen or if the condition fails to improve as anticipated.   Letta Median, DO

## 2019-10-02 DIAGNOSIS — M47812 Spondylosis without myelopathy or radiculopathy, cervical region: Secondary | ICD-10-CM | POA: Insufficient documentation

## 2019-10-02 DIAGNOSIS — S129XXA Fracture of neck, unspecified, initial encounter: Secondary | ICD-10-CM | POA: Insufficient documentation

## 2019-10-06 ENCOUNTER — Encounter: Payer: Self-pay | Admitting: Family Medicine

## 2019-10-24 IMAGING — MR MR HEAD WO/W CM
4 of 5 series · 13 of 48 positions shown · IV contrast (multihance)
Comparison: MRI head 03/12/2016

CLINICAL DATA: Numbness and tingling, paresthesia

EXAM:
MRI HEAD WITHOUT AND WITH CONTRAST
TECHNIQUE: Multiplanar, multiecho pulse sequences of the brain and surrounding
structures were obtained without and with intravenous contrast.
CONTRAST:  12mL MULTIHANCE GADOBENATE DIMEGLUMINE 529 MG/ML IV SOLN

[Series 4: T2 · sagittal · 4.0mm · 0.36mm/px · 4 of 13 slices shown (1 of 3)]
[im 1/13]
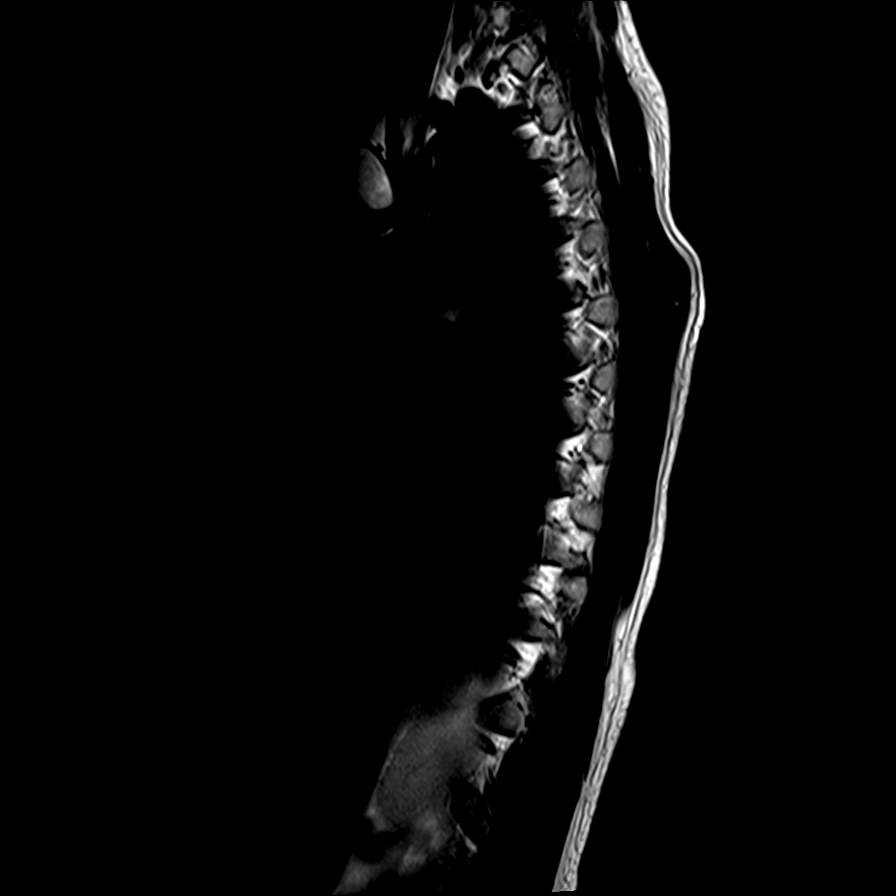
[im 3/13]
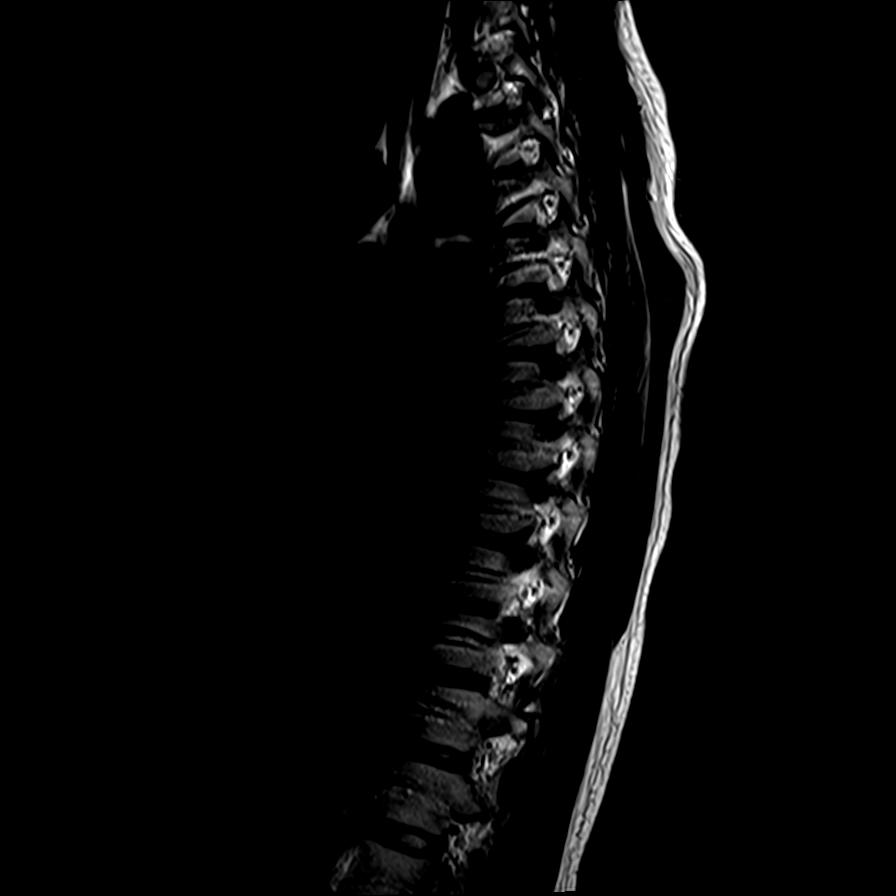
[im 8/13]
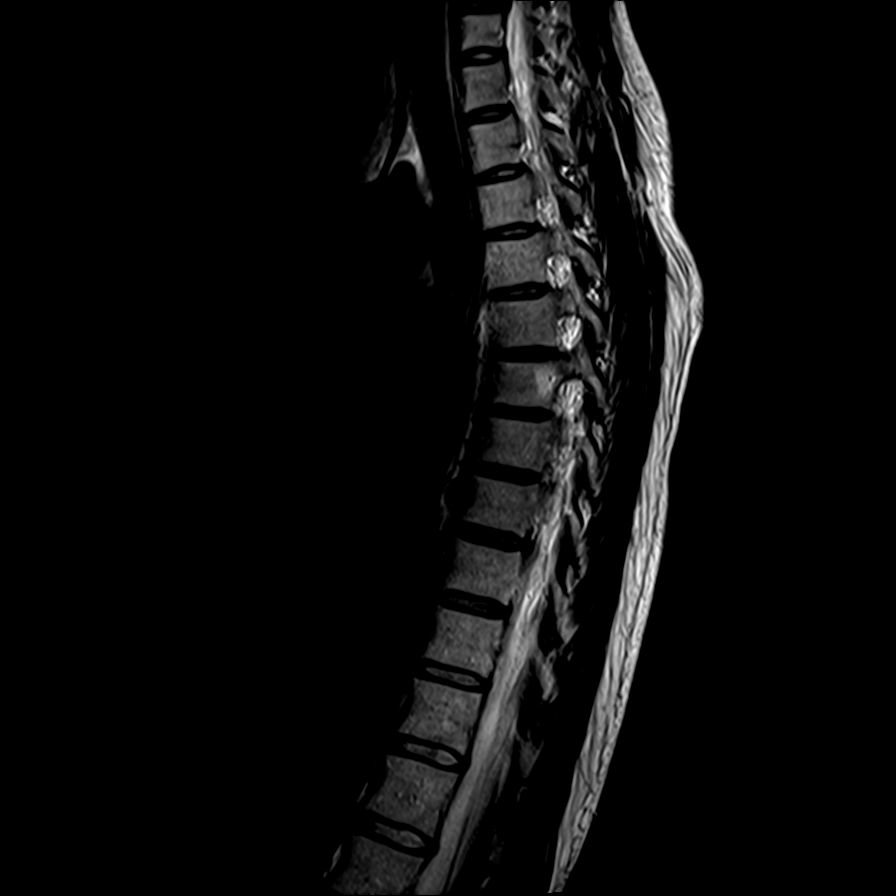
[im 13/13]
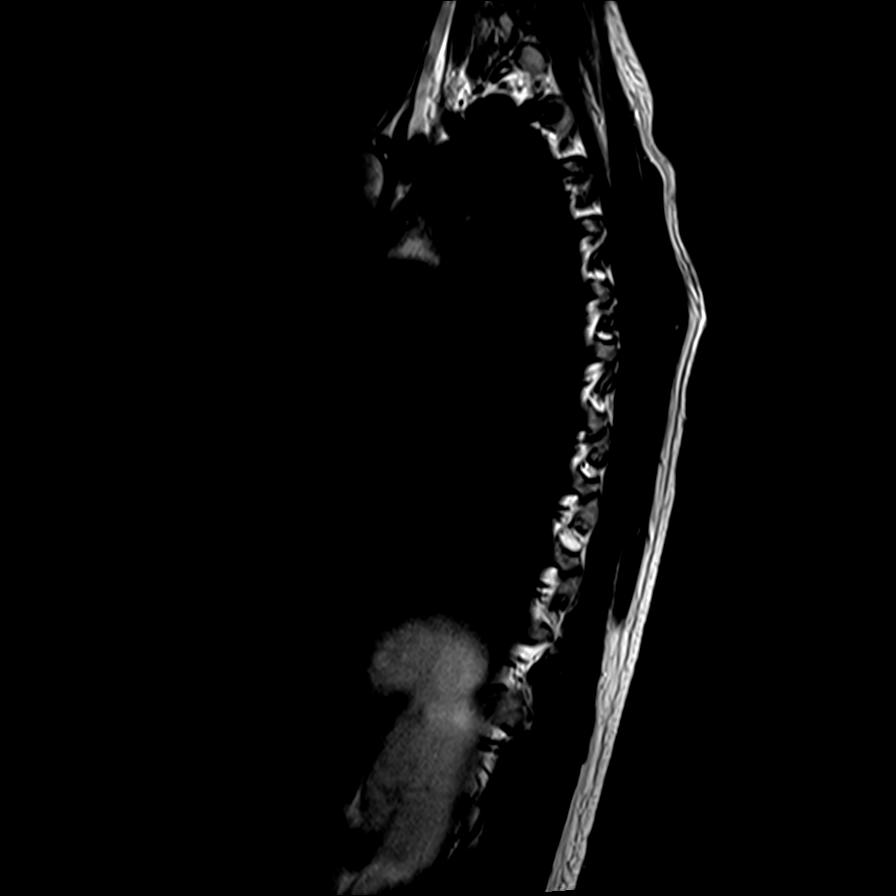

[Series 5: T1 · sagittal · 4.0mm · 0.71mm/px · 3 of 13 slices shown]
[im 1/13]
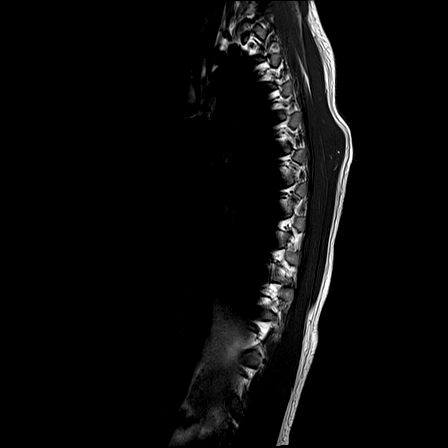
[im 7/13]
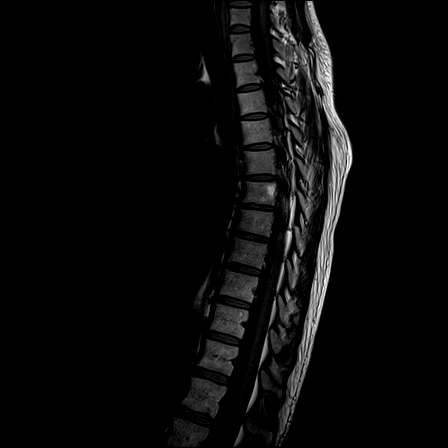
[im 13/13]
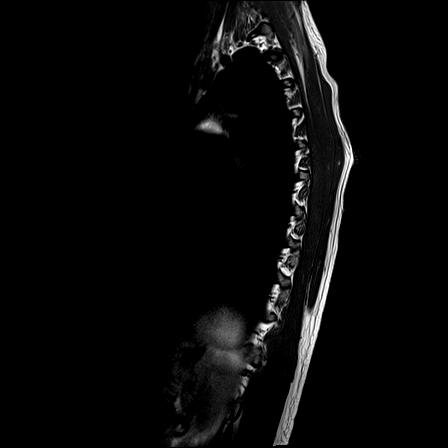

[Series 7: T2 · axial · 4.0mm · 0.39mm/px · z∈[-294,-128]mm · 3 of 39 slices shown (2 of 3)]
[im 6/39]
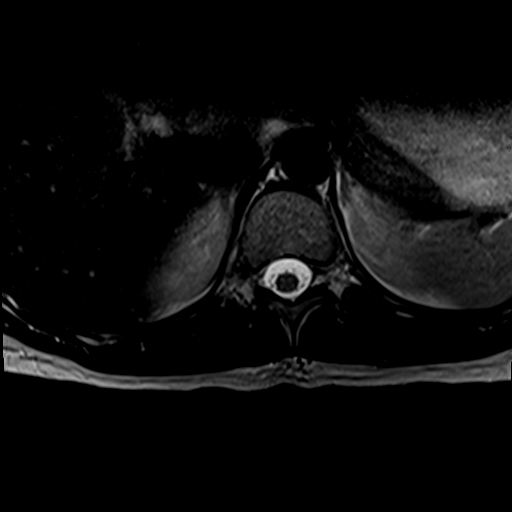
[im 21/39]
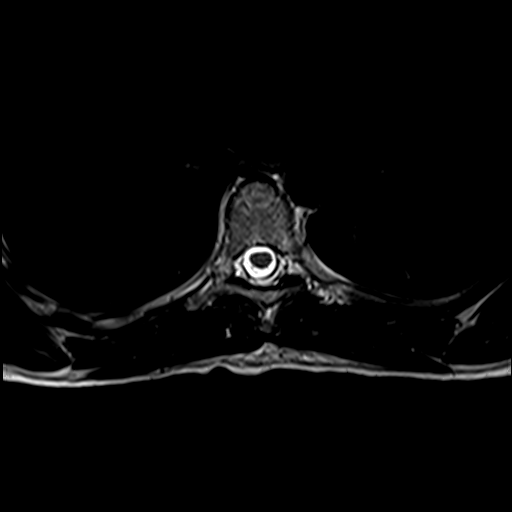
[im 33/39]
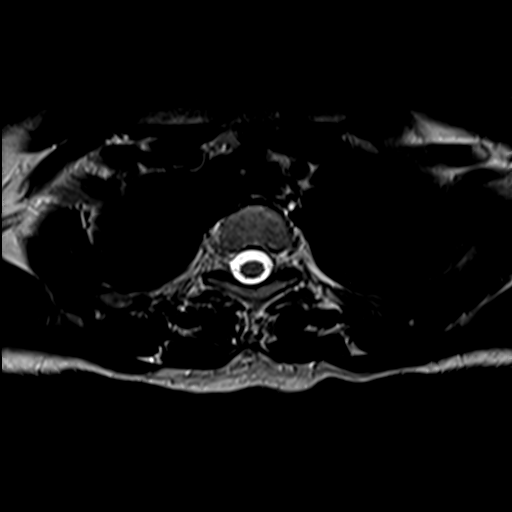

[Series 8: T2 · axial · 4.0mm · 0.39mm/px · z∈[-294,-124]mm · 3 of 39 slices shown (3 of 3)]
[im 6/39]
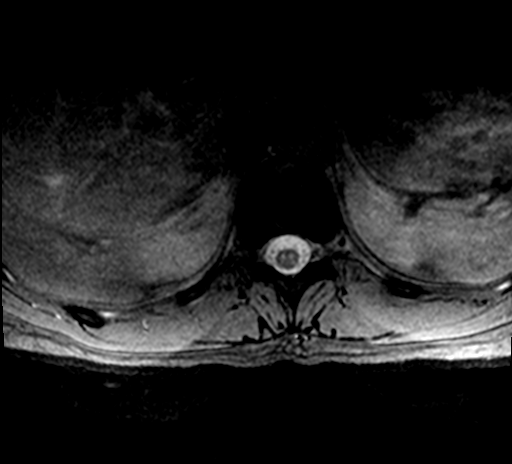
[im 21/39]
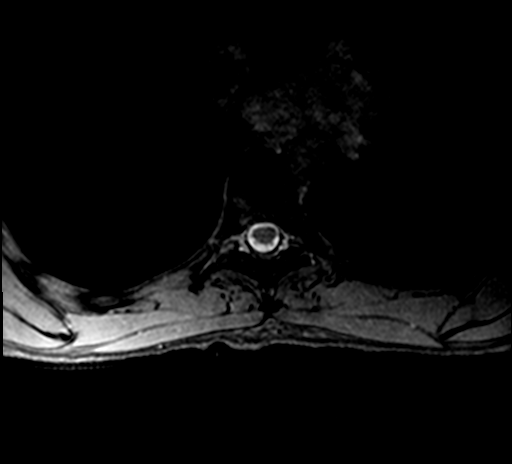
[im 33/39]
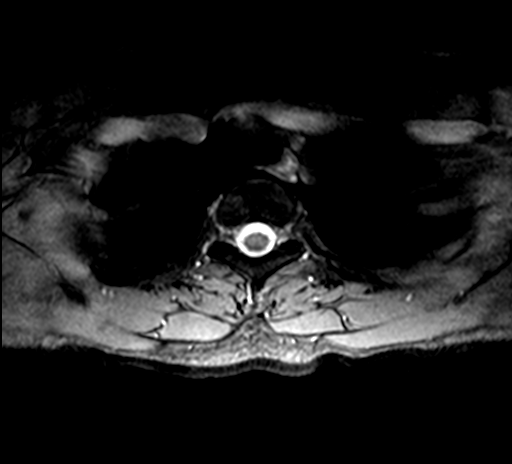

[13 of 48 positions shown; findings below may reference images not displayed]

FINDINGS: Brain: Small hyperintensities in the frontal white matter
bilaterally unchanged. These are in the deep white matter.
Periventricular white matter normal. Brainstem and cerebellum
normal. Negative for hemorrhage or mass. Ventricle size and cerebral
volume normal.

Postcontrast imaging demonstrates no enhancing mass lesion. Small
enhancing vessel with branching in the left occipital lobe posterior
to the splenium most likely a developmental venous anomaly.

Vascular: Normal arterial flow voids. Developmental venous anomaly
left occipital lobe

Skull and upper cervical spine: Negative

Sinuses/Orbits: Negative

Other: None
IMPRESSION: Small developmental venous anomaly left occipital lobe.

No acute abnormality

Small deep white matter hyperintensities in both frontal lobes
unchanged from the prior study and nonspecific in appearance.
Possible chronic ischemia or complicated migraine headaches.
Demyelinating disease not considered likely.

## 2019-11-05 ENCOUNTER — Other Ambulatory Visit: Payer: Self-pay

## 2019-11-05 ENCOUNTER — Encounter: Payer: Self-pay | Admitting: Nurse Practitioner

## 2019-11-05 ENCOUNTER — Ambulatory Visit (INDEPENDENT_AMBULATORY_CARE_PROVIDER_SITE_OTHER): Payer: No Typology Code available for payment source | Admitting: Nurse Practitioner

## 2019-11-05 VITALS — BP 110/64 | HR 55 | Temp 97.4°F | Ht 66.0 in | Wt 138.8 lb

## 2019-11-05 DIAGNOSIS — L02224 Furuncle of groin: Secondary | ICD-10-CM

## 2019-11-05 MED ORDER — SULFAMETHOXAZOLE-TRIMETHOPRIM 800-160 MG PO TABS: 1.0000 | ORAL_TABLET | Freq: Two times a day (BID) | ORAL | 0 refills | Status: DC

## 2019-11-05 NOTE — Patient Instructions (Signed)
Use sitz bath 56mins daily  How to Take a [employer] A sitz bath is a warm water bath that may be used to care for your rectum, genital area, or the area between your rectum and genitals (perineum). For a sitz bath, the water only comes up to your hips and covers your buttocks. A sitz bath may done at home in a bathtub or with a portable sitz bath that fits over the toilet. Your health care provider may recommend a sitz bath to help:  Relieve pain and discomfort after delivering a baby.  Relieve pain and itching from hemorrhoids or anal fissures.  Relieve pain after certain surgeries.  Relax muscles that are sore or tight. How to take a sitz bath Take 3-4 sitz baths a day, or as many as told by your health care provider. Bathtub sitz bath To take a sitz bath in a bathtub: 1. Partially fill a bathtub with warm water. The water should be deep enough to cover your hips and buttocks when you are sitting in the tub. 2. If your health care provider told you to put medicine in the water, follow his or her instructions. 3. Sit in the water. 4. Open the tub drain a little, and leave it open during your bath. 5. Turn on the warm water again, enough to replace the water that is draining out. Keep the water running throughout your bath. This helps keep the water at the right level and the right temperature. 6. Soak in the water for 15-20 minutes, or as long as told by your health care provider. 7. When you are done, be careful when you stand up. You may feel dizzy. 8. After the sitz bath, pat yourself dry. Do not rub your skin to dry it.  Over-the-toilet sitz bath To take a sitz bath with an over-the-toilet basin: 1. Follow the manufacturer's instructions. 2. Fill the basin with warm water. 3. If your health care provider told you to put medicine in the water, follow his or her instructions. 4. Sit on the seat. Make sure the water covers your buttocks and perineum. 5. Soak in the water for 15-20  minutes, or as long as told by your health care provider. 6. After the sitz bath, pat yourself dry. Do not rub your skin to dry it. 7. Clean and dry the basin between uses. 8. Discard the basin if it cracks, or according to the manufacturer's instructions. Contact a health care provider if:  Your symptoms get worse. Do not continue with sitz baths if your symptoms get worse.  You have new symptoms. If this happens, do not continue with sitz baths until you talk with your health care provider. Summary  A sitz bath is a warm water bath in which the water only comes up to your hips and covers your buttocks.  A sitz bath may help relieve itching, relieve pain, and relax muscles that are sore or tight in the lower part of your body, including your genital area.  Take 3-4 sitz baths a day, or as many as told by your health care provider. Soak in the water for 15-20 minutes.  Do not continue with sitz baths if your symptoms get worse. This information is not intended to replace advice given to you by your health care provider. Make sure you discuss any questions you have with your health care provider. Document Revised: 11/13/2018 Document Reviewed: 06/16/2017 Elsevier Patient Education  [employer].   Skin Abscess  A skin  abscess is an infected area on or under your skin that contains a collection of pus and other material. An abscess may also be called a furuncle, carbuncle, or boil. An abscess can occur in or on almost any part of your body. Some abscesses break open (rupture) on their own. Most continue to get worse unless they are treated. The infection can spread deeper into the body and eventually into your blood, which can make you feel ill. Treatment usually involves draining the abscess. What are the causes? An abscess occurs when germs, like bacteria, pass through your skin and cause an infection. This may be caused by:  A scrape or cut on your skin.  A puncture wound  through your skin, including a needle injection or insect bite.  Blocked oil or sweat glands.  Blocked and infected hair follicles.  A cyst that forms beneath your skin (sebaceous cyst) and becomes infected. What increases the risk? This condition is more likely to develop in people who:  Have a weak body defense system (immune system).  Have diabetes.  Have dry and irritated skin.  Get frequent injections or use illegal IV drugs.  Have a foreign body in a wound, such as a splinter.  Have problems with their lymph system or veins. What are the signs or symptoms? Symptoms of this condition include:  A painful, firm bump under the skin.  A bump with pus at the top. This may break through the skin and drain. Other symptoms include:  Redness surrounding the abscess site.  Warmth.  Swelling of the lymph nodes (glands) near the abscess.  Tenderness.  A sore on the skin. How is this diagnosed? This condition may be diagnosed based on:  A physical exam.  Your medical history.  A sample of pus. This may be used to find out what is causing the infection.  Blood tests.  Imaging tests, such as an ultrasound, CT scan, or MRI. How is this treated? A small abscess that drains on its own may not need treatment. Treatment for larger abscesses may include:  Moist heat or heat pack applied to the area several times a day.  A procedure to drain the abscess (incision and drainage).  Antibiotic medicines. For a severe abscess, you may first get antibiotics through an IV and then change to antibiotics by mouth. Follow these instructions at home: Medicines   Take over-the-counter and prescription medicines only as told by your health care provider.  If you were prescribed an antibiotic medicine, take it as told by your health care provider. Do not stop taking the antibiotic even if you start to feel better. Abscess care   If you have an abscess that has not drained, apply  heat to the affected area. Use the heat source that your health care provider recommends, such as a moist heat pack or a heating pad. ? Place a towel between your skin and the heat source. ? Leave the heat on for 20-30 minutes. ? Remove the heat if your skin turns bright red. This is especially important if you are unable to feel pain, heat, or cold. You may have a greater risk of getting burned.  Follow instructions from your health care provider about how to take care of your abscess. Make sure you: ? Cover the abscess with a bandage (dressing). ? Change your dressing or gauze as told by your health care provider. ? Wash your hands with soap and water before you change the dressing or gauze. If  soap and water are not available, use hand sanitizer.  Check your abscess every day for signs of a worsening infection. Check for: ? More redness, swelling, or pain. ? More fluid or blood. ? Warmth. ? More pus or a bad smell. General instructions  To avoid spreading the infection: ? Do not share personal care items, towels, or hot tubs with others. ? Avoid making skin contact with other people.  Keep all follow-up visits as told by your health care provider. This is important. Contact a health care provider if you have:  More redness, swelling, or pain around your abscess.  More fluid or blood coming from your abscess.  Warm skin around your abscess.  More pus or a bad smell coming from your abscess.  A fever.  Muscle aches.  Chills or a general ill feeling. Get help right away if you:  Have severe pain.  See red streaks on your skin spreading away from the abscess. Summary  A skin abscess is an infected area on or under your skin that contains a collection of pus and other material.  A small abscess that drains on its own may not need treatment.  Treatment for larger abscesses may include having a procedure to drain the abscess and taking an antibiotic. This information is  not intended to replace advice given to you by your health care provider. Make sure you discuss any questions you have with your health care provider. Document Revised: 10/05/2018 Document Reviewed: 07/28/2017 Elsevier Patient Education  2020 [employer].

## 2019-11-05 NOTE — Progress Notes (Signed)
Subjective:  Patient ID: Kristie [name], female    DOB: Nov 20, 1980  Age: 39 y.o. MRN: [id number]  CC: Mass (pt reported its in between the leg and labia for 3 weeks//sore and oozing puss//agitates it went cycling-pt reports she gets these cause she bicylces alot-this one not going away)  Rash This is a recurrent problem. The current episode started in the past 7 days. The problem is unchanged. The affected locations include the groin. The rash is characterized by pain, redness and draining. She was exposed to nothing. Pertinent negatives include no fatigue, fever or joint pain. Treatments tried: warm compress. The treatment provided no relief. There is no history of allergies, asthma, eczema or varicella.  shaves with razor. Onset after shaving. Worse after cycling Has backpacking trip coming up and is concerned lesion might worsen. She will be gone for 8days.  Reviewed past Medical, Social and Family history today.  Outpatient Medications Prior to Visit  Medication Sig Dispense Refill  . albuterol (VENTOLIN HFA) 108 (90 Base) MCG/ACT inhaler Inhale 2 puffs into the lungs every 4 (four) hours as needed for wheezing or shortness of breath. 18 g 2  . B Complex Vitamins (VITAMIN B COMPLEX PO) Take 1 tablet by mouth daily.    . Cholecalciferol (VITAMIN D PO) Take 1 capsule by mouth daily.    . diazepam (VALIUM) 2 MG tablet Take 1 tablet (2 mg total) by mouth every 8 (eight) hours as needed for anxiety. 30 tablet 1  . MAGNESIUM OXIDE PO Take 5 mLs by mouth 2 (two) times daily.    . Melatonin 10 MG TABS Take 10 mg by mouth at bedtime as needed (sleep).    . valACYclovir (VALTREX) 500 MG tablet Take 1 tablet (500 mg total) by mouth 2 (two) times daily. 180 tablet 2  . VITAMIN D-VITAMIN K PO Take 1 tablet by mouth daily.    . diclofenac (VOLTAREN) 75 MG EC tablet Take 1 tablet (75 mg total) by mouth 2 (two) times daily. (Patient not taking: Reported on 11/05/2019) 60 tablet 1   No  facility-administered medications prior to visit.    ROS See HPI  Objective:  BP 110/64   Pulse (!) 55   Temp (!) 97.4 F (36.3 C) (Tympanic)   Ht 5\' 6"  (1.676 m)   Wt 138 lb 12.8 oz (63 kg)   SpO2 99%   BMI 22.40 kg/m   BP Readings from Last 3 Encounters:  11/05/19 110/64  09/26/19 125/82  04/18/19 100/60    Wt Readings from Last 3 Encounters:  11/05/19 138 lb 12.8 oz (63 kg)  04/18/19 130 lb (59 kg)  01/03/19 126 lb 12.8 oz (57.5 kg)   Physical Exam Vitals reviewed.  Cardiovascular:     Rate and Rhythm: Normal rate.     Pulses: Normal pulses.  Pulmonary:     Effort: Pulmonary effort is normal.  Skin:    Findings: Erythema and rash present.       Neurological:     Mental Status: She is alert and oriented to person, place, and time.  Psychiatric:        Mood and Affect: Mood normal.        Behavior: Behavior normal.        Thought Content: Thought content normal.     Lab Results  Component Value Date   WBC 4.3 08/07/2019   HGB 12.3 08/07/2019   HCT 37.5 08/07/2019   PLT 174 08/07/2019   GLUCOSE 83  08/07/2019   CHOL 141 08/11/2016   TRIG 57 08/11/2016   HDL 68 08/11/2016   LDLCALC 62 08/11/2016   ALT 17 08/07/2019   AST 20 08/07/2019   NA 140 08/07/2019   K 3.9 08/07/2019   CL 106 08/07/2019   CREATININE 0.84 08/07/2019   BUN 8 08/07/2019   CO2 18 (L) 08/07/2019   TSH 1.340 08/07/2019   HGBA1C 5.2 08/07/2019    Assessment & Plan:  This visit occurred during the SARS-CoV-2 public health emergency.  Safety protocols were in place, including screening questions prior to the visit, additional usage of staff PPE, and extensive cleaning of exam room while observing appropriate contact time as indicated for disinfecting solutions.   Kristie [name] was seen today for mass.  Diagnoses and all orders for this visit:  Furuncle of groin -     sulfamethoxazole-trimethoprim (BACTRIM DS) 800-160 MG tablet; Take 1 tablet by mouth 2 (two) times daily.   I am  having Kristie [name] start on sulfamethoxazole-trimethoprim. I am also having her maintain her Melatonin, MAGNESIUM OXIDE PO, Cholecalciferol (VITAMIN D PO), valACYclovir, B Complex Vitamins (VITAMIN B COMPLEX PO), VITAMIN D-VITAMIN K PO, diazepam, albuterol, and diclofenac.  Meds ordered this encounter  Medications  . sulfamethoxazole-trimethoprim (BACTRIM DS) 800-160 MG tablet    Sig: Take 1 tablet by mouth 2 (two) times daily.    Dispense:  20 tablet    Refill:  0    Order Specific Question:   Supervising Provider    Answer:   Ronnald Nian [id number]    Problem List Items Addressed This Visit    None    Visit Diagnoses    Furuncle of groin    -  Primary   Relevant Medications   sulfamethoxazole-trimethoprim (BACTRIM DS) 800-160 MG tablet       Follow-up: Return if symptoms worsen or fail to improve.  Wilfred Lacy, NP

## 2019-12-06 ENCOUNTER — Encounter: Payer: Self-pay | Admitting: Family Medicine

## 2019-12-06 ENCOUNTER — Telehealth (INDEPENDENT_AMBULATORY_CARE_PROVIDER_SITE_OTHER): Payer: No Typology Code available for payment source | Admitting: Family Medicine

## 2019-12-06 VITALS — HR 60 | Temp 97.2°F | Ht 66.0 in

## 2019-12-06 DIAGNOSIS — L02224 Furuncle of groin: Secondary | ICD-10-CM

## 2019-12-06 DIAGNOSIS — R109 Unspecified abdominal pain: Secondary | ICD-10-CM | POA: Diagnosis not present

## 2019-12-06 DIAGNOSIS — R197 Diarrhea, unspecified: Secondary | ICD-10-CM | POA: Diagnosis not present

## 2019-12-06 NOTE — Progress Notes (Signed)
Virtual Visit via Video Note  I connected with Kristie [name] on 12/06/19 at  2:30 PM EDT by a video enabled telemedicine application and verified that I am speaking with the correct person using two identifiers. Location patient: home Location provider: work Persons participating in the virtual visit: patient, provider  I discussed the limitations of evaluation and management by telemedicine and the availability of in person appointments. The patient expressed understanding and agreed to proceed.  Chief Complaint  Patient presents with  . Recurrent Skin Infections    boil, had been seen a couple of months ago here by NP.  Boil is on the rt side of growing, pt said that it has not gone away and gotten slightly bigger, leakage.  . Abdominal Pain    Pt c/o abd pain along with diarrhea and nausea x 2weeks     HPI: Kristie [name] is a 39 y.o. female  1. Boil in groin x 2 mo. Slightly increased in size. + drainage. No fever, chills.   2. Pt was on hiking trip at the end of May - got headache, nausea, loose stools, abd pain. Acute symptoms resolved but since that time has had intermittent abdominal pain, nausea. She describes pain as burning. No vomiting. She has been eating bland diet. No blood in stool.  She has been drinking ginger tea, eating ginger.    Past Medical History:  Diagnosis Date  . Anxiety   . Asthma, exercise induced   . Complication of anesthesia    Very sensitive to medicine  . Dysplasia of cervix, high grade CIN 2   . HNP (herniated nucleus pulposus) with myelopathy, cervical   . HPV in female   . Migraines   . Seasonal allergies     Past Surgical History:  Procedure Laterality Date  . ANTERIOR CERVICAL DECOMP/DISCECTOMY FUSION N/A 02/01/2018   Procedure: Anterior cervical decompression/discectomy/fusion Cervical five-six Cervical six-seven;  Surgeon: Jovita Gamma, MD;  Location: [place];  Service: Neurosurgery;  Laterality: N/A;  . LASER ABLATION OF THE  CERVIX  2013   CIN-2  . VAGINAL CYST EXCISED  2001    Family History  Problem Relation Age of Onset  . Hypertension Mother   . Breast cancer Maternal Aunt   . Prostate cancer Maternal Uncle   . Hypertension Maternal Grandmother   . Heart disease Maternal Grandmother   . Lung cancer Maternal Grandfather     Social History   Tobacco Use  . Smoking status: Former Research scientist (life sciences)  . Smokeless tobacco: Former Systems developer    Quit date: 09/17/2003  Vaping Use  . Vaping Use: Never used  Substance Use Topics  . Alcohol use: No  . Drug use: No     Current Outpatient Medications:  .  albuterol (VENTOLIN HFA) 108 (90 Base) MCG/ACT inhaler, Inhale 2 puffs into the lungs every 4 (four) hours as needed for wheezing or shortness of breath., Disp: 18 g, Rfl: 2 .  B Complex Vitamins (VITAMIN B COMPLEX PO), Take 1 tablet by mouth daily., Disp: , Rfl:  .  Cholecalciferol (VITAMIN D PO), Take 1 capsule by mouth daily., Disp: , Rfl:  .  diazepam (VALIUM) 2 MG tablet, Take 1 tablet (2 mg total) by mouth every 8 (eight) hours as needed for anxiety., Disp: 30 tablet, Rfl: 1 .  MAGNESIUM OXIDE PO, Take 5 mLs by mouth 2 (two) times daily., Disp: , Rfl:  .  Melatonin 10 MG TABS, Take 10 mg by mouth at bedtime as  needed (sleep)., Disp: , Rfl:  .  valACYclovir (VALTREX) 500 MG tablet, Take 1 tablet (500 mg total) by mouth 2 (two) times daily., Disp: 180 tablet, Rfl: 2 .  VITAMIN D-VITAMIN K PO, Take 1 tablet by mouth daily., Disp: , Rfl:  .  diclofenac (VOLTAREN) 75 MG EC tablet, Take 1 tablet (75 mg total) by mouth 2 (two) times daily. (Patient not taking: Reported on 12/06/2019), Disp: 60 tablet, Rfl: 1 .  sulfamethoxazole-trimethoprim (BACTRIM DS) 800-160 MG tablet, Take 1 tablet by mouth 2 (two) times daily. (Patient not taking: Reported on 12/06/2019), Disp: 20 tablet, Rfl: 0  Allergies  Allergen Reactions  . Acyclovir And Related Nausea Only and Other (See Comments)    Abdominal pain dizziness.  . Gabapentin  Other (See Comments)    Caused symptoms of depression  . Tindamax [Tinidazole]     UNSPECIFIED REACTION   . Sertraline Hcl Other (See Comments)    Paranoia.       ROS: See pertinent positives and negatives per HPI.   EXAM:  VITALS per patient if applicable: Pulse 60   Temp (!) 97.2 F (36.2 C) (Oral)   Ht 5\' 6"  (1.676 m)   LMP 11/18/2019   BMI 22.40 kg/m   GENERAL: alert, oriented, appears well and in no acute distress  HEENT: atraumatic, conjunctiva clear, no obvious abnormalities on inspection of external nose and ears  NECK: normal movements of the head and neck  LUNGS: on inspection no signs of respiratory distress, breathing rate appears normal, no obvious gross SOB, gasping or wheezing, no conversational dyspnea  CV: no obvious cyanosis  PSYCH/NEURO: pleasant and cooperative, speech and thought processing grossly intact   ASSESSMENT AND PLAN: 1. Abdominal pain, unspecified abdominal location 2. Diarrhea, unspecified type - on/off x 2 wks - taking probiotic daily - trial of omeprazole, pepto-bismol - if no/minimal improvement in 4-5 days, recommend labs and stool studies - Basic metabolic panel; Future - CBC w/Diff; Future - Gastrointestinal Pathogen Panel PCR; Future - Clostridium Difficile by PCR([facility]); Future - Ova and parasite examination; Futur  3. Furuncle of groin - persistent x 2 mo, has derm appt in 2ish weeks. Pt will confirm derm can excise and if not, will refer to general surgery    I discussed the assessment and treatment plan with the patient. The patient was provided an opportunity to ask questions and all were answered. The patient agreed with the plan and demonstrated an understanding of the instructions.   The patient was advised to call back or seek an in-person evaluation if the symptoms worsen or if the condition fails to improve as anticipated.   Letta Median, DO

## 2019-12-10 NOTE — Addendum Note (Signed)
Addended by: Darral Dash on: 12/10/2019 11:03 AM   Modules accepted: Orders

## 2019-12-11 ENCOUNTER — Other Ambulatory Visit: Payer: Self-pay

## 2019-12-11 ENCOUNTER — Encounter: Payer: Self-pay | Admitting: Family Medicine

## 2019-12-11 ENCOUNTER — Other Ambulatory Visit (INDEPENDENT_AMBULATORY_CARE_PROVIDER_SITE_OTHER): Payer: No Typology Code available for payment source

## 2019-12-11 DIAGNOSIS — R109 Unspecified abdominal pain: Secondary | ICD-10-CM

## 2019-12-11 DIAGNOSIS — R197 Diarrhea, unspecified: Secondary | ICD-10-CM | POA: Diagnosis not present

## 2019-12-11 NOTE — Telephone Encounter (Signed)
Please see message and advise.  Thank you. ° °

## 2019-12-11 NOTE — Addendum Note (Signed)
Addended by: Doran Clay A on: 12/11/2019 03:14 PM   Modules accepted: Orders

## 2019-12-12 ENCOUNTER — Other Ambulatory Visit: Payer: No Typology Code available for payment source

## 2019-12-12 LAB — BASIC METABOLIC PANEL
BUN: 8 mg/dL (ref 6–23)
CO2: 27 mEq/L (ref 19–32)
Calcium: 9.1 mg/dL (ref 8.4–10.5)
Chloride: 104 mEq/L (ref 96–112)
Creatinine, Ser: 0.77 mg/dL (ref 0.40–1.20)
GFR: 82.64 mL/min (ref >60.00)
Glucose, Bld: 86 mg/dL (ref 70–99)
Potassium: 4.3 mEq/L (ref 3.5–5.1)
Sodium: 137 mEq/L (ref 135–145)

## 2019-12-12 LAB — CBC WITH DIFFERENTIAL/PLATELET
Basophils Absolute: 0 10*3/uL (ref 0.0–0.1)
Basophils Relative: 0.5 % (ref 0.0–3.0)
Eosinophils Absolute: 0.3 10*3/uL (ref 0.0–0.7)
Eosinophils Relative: 5 % (ref 0.0–5.0)
HCT: 39 % (ref 36.0–46.0)
Hemoglobin: 13.2 g/dL (ref 12.0–15.0)
Lymphocytes Relative: 31.6 % (ref 12.0–46.0)
Lymphs Abs: 1.7 10*3/uL (ref 0.7–4.0)
MCHC: 33.8 g/dL (ref 30.0–36.0)
MCV: 93.2 fl (ref 78.0–100.0)
Monocytes Absolute: 0.5 10*3/uL (ref 0.1–1.0)
Monocytes Relative: 9.4 % (ref 3.0–12.0)
Neutro Abs: 2.9 10*3/uL (ref 1.4–7.7)
Neutrophils Relative %: 53.5 % (ref 43.0–77.0)
Platelets: 179 10*3/uL (ref 150.0–400.0)
RBC: 4.18 Mil/uL (ref 3.87–5.11)
RDW: 12.8 % (ref 11.5–15.5)
WBC: 5.5 10*3/uL (ref 4.0–10.5)

## 2019-12-14 LAB — CLOSTRIDIUM DIFFICILE BY PCR: Toxigenic C. Difficile by PCR: NEGATIVE

## 2019-12-18 LAB — OVA AND PARASITE EXAMINATION
CONCENTRATE RESULT:: NONE SEEN
MICRO NUMBER:: 10598476
SPECIMEN QUALITY:: ADEQUATE
TRICHROME RESULT:: NONE SEEN

## 2019-12-18 LAB — GASTROINTESTINAL PATHOGEN PANEL PCR
C. difficile Tox A/B, PCR: NOT DETECTED
Campylobacter, PCR: NOT DETECTED
Cryptosporidium, PCR: NOT DETECTED
E coli (ETEC) LT/ST PCR: NOT DETECTED
E coli (STEC) stx1/stx2, PCR: NOT DETECTED
E coli 0157, PCR: NOT DETECTED
Giardia lamblia, PCR: NOT DETECTED
Norovirus, PCR: NOT DETECTED
Rotavirus A, PCR: NOT DETECTED
Salmonella, PCR: NOT DETECTED
Shigella, PCR: NOT DETECTED

## 2019-12-19 ENCOUNTER — Other Ambulatory Visit: Payer: No Typology Code available for payment source

## 2019-12-19 ENCOUNTER — Other Ambulatory Visit: Payer: Self-pay

## 2019-12-19 DIAGNOSIS — R109 Unspecified abdominal pain: Secondary | ICD-10-CM

## 2019-12-19 DIAGNOSIS — R140 Abdominal distension (gaseous): Secondary | ICD-10-CM

## 2019-12-19 DIAGNOSIS — R197 Diarrhea, unspecified: Secondary | ICD-10-CM

## 2019-12-21 LAB — H. PYLORI ANTIGEN, STOOL: H pylori Ag, Stl: NEGATIVE

## 2019-12-25 ENCOUNTER — Other Ambulatory Visit (HOSPITAL_COMMUNITY): Payer: Self-pay | Admitting: Dermatology

## 2019-12-25 MED FILL — CLINDAMYCIN PHOSP 1% LOTION: 1 | 30 days supply | Qty: 60 | Fill #0

## 2019-12-28 ENCOUNTER — Other Ambulatory Visit: Payer: Self-pay

## 2019-12-28 ENCOUNTER — Ambulatory Visit (INDEPENDENT_AMBULATORY_CARE_PROVIDER_SITE_OTHER)
Admission: RE | Admit: 2019-12-28 | Discharge: 2019-12-28 | Disposition: A | Discharge status: Home / Self Care | Payer: No Typology Code available for payment source | Source: Ambulatory Visit

## 2019-12-28 DIAGNOSIS — N898 Other specified noninflammatory disorders of vagina: Secondary | ICD-10-CM

## 2019-12-28 MED ORDER — FLUCONAZOLE 150 MG PO TABS
150.0000 mg | ORAL_TABLET | Freq: Every day | ORAL | 0 refills | Status: DC
Start: 2019-12-28 — End: 2020-03-07

## 2019-12-28 MED FILL — FLUCONAZOLE 150 MG TABS: 150 | 2 days supply | Qty: 2 | Fill #0

## 2019-12-28 NOTE — Discharge Instructions (Signed)
Take the Diflucan as directed.    Follow up with your primary care provider if your symptoms are not improving.    

## 2019-12-28 NOTE — ED Provider Notes (Signed)
Virtual Visit via Video Note:  Kristie [name]  initiated request for Telemedicine visit with [facility] team. I connected with Kristie [name]  on 12/28/2019 at 9:06 AM  for a synchronized telemedicine visit using a video enabled HIPPA compliant telemedicine application. I verified that I am speaking with Kristie [name]  using two identifiers. Sharion Balloon, NP  was physically located in a [facility] Urgent care site and Jenesys A Dragan was located at a different location.   The limitations of evaluation and management by telemedicine as well as the availability of in-person appointments were discussed. Patient was informed that she  may incur a bill ( including co-pay) for this virtual visit encounter. Kristie [name]  expressed understanding and gave verbal consent to proceed with virtual visit.     History of Present Illness:Kristie [name]  is a 39 y.o. female presents for evaluation of vaginal itching and small amount of thick white vaginal discharge x 1 week.  Treatment attempted at home with probiotics.  Her symptoms feel similar to previous episodes of vaginal yeast infection.  She denies fever, chills, dysuria, pelvic pain, or other symptoms.  She denies current pregnancy or breastfeeding.      Allergies  Allergen Reactions  . Acyclovir And Related Nausea Only and Other (See Comments)    Abdominal pain dizziness.  . Gabapentin Other (See Comments)    Caused symptoms of depression  . Tindamax [Tinidazole]     UNSPECIFIED REACTION   . Sertraline Hcl Other (See Comments)    Paranoia.      Past Medical History:  Diagnosis Date  . Anxiety   . Asthma, exercise induced   . Complication of anesthesia    Very sensitive to medicine  . Dysplasia of cervix, high grade CIN 2   . HNP (herniated nucleus pulposus) with myelopathy, cervical   . HPV in female   . Migraines   . Seasonal allergies      Social History   Tobacco Use  . Smoking status: Former Research scientist (life sciences)  .  Smokeless tobacco: Former Systems developer    Quit date: 09/17/2003  Vaping Use  . Vaping Use: Never used  Substance Use Topics  . Alcohol use: No  . Drug use: No   ROS: as stated in HPI.  All other systems reviewed and negative.     Observations/Objective: Physical Exam  VITALS: Patient denies fever. GENERAL: Alert, appears well and in no acute distress. HEENT: Atraumatic. Oral mucosa appears moist. NECK: Normal movements of the head and neck. CARDIOPULMONARY: No increased WOB. Speaking in clear sentences. I:E ratio WNL.  MS: Moves all visible extremities without noticeable abnormality. PSYCH: Pleasant and cooperative, well-groomed. Speech normal rate and rhythm. Affect is appropriate. Insight and judgement are appropriate. Attention is focused, linear, and appropriate.  NEURO: CN grossly intact. Oriented as arrived to appointment on time with no prompting. Moves both UE equally.  SKIN: No obvious lesions, wounds, erythema, or cyanosis noted on face or hands.   Assessment and Plan:    ICD-10-CM   1. Vaginal discharge  N89.8   2. Vaginal itching  N89.8        Follow Up Instructions: Treating with Diflucan.  Education provided about vaginal yeast infections.  Instructed patient to follow-up with her PCP if her symptoms are not improving.  Patient agrees to plan of care.      I discussed the assessment and treatment plan with the patient. The patient was provided an  opportunity to ask questions and all were answered. The patient agreed with the plan and demonstrated an understanding of the instructions.   The patient was advised to call back or seek an in-person evaluation if the symptoms worsen or if the condition fails to improve as anticipated.      Sharion Balloon, NP  12/28/2019 9:06 AM         Sharion Balloon, NP 12/28/19 [phone]

## 2020-01-01 ENCOUNTER — Other Ambulatory Visit: Payer: Self-pay

## 2020-01-02 ENCOUNTER — Encounter: Payer: Self-pay | Admitting: Family Medicine

## 2020-01-02 ENCOUNTER — Ambulatory Visit (INDEPENDENT_AMBULATORY_CARE_PROVIDER_SITE_OTHER): Payer: No Typology Code available for payment source | Admitting: Family Medicine

## 2020-01-02 VITALS — BP 100/62 | HR 58 | Temp 98.4°F | Ht 66.0 in | Wt 128.8 lb

## 2020-01-02 DIAGNOSIS — R110 Nausea: Secondary | ICD-10-CM

## 2020-01-02 DIAGNOSIS — R109 Unspecified abdominal pain: Secondary | ICD-10-CM | POA: Diagnosis not present

## 2020-01-02 DIAGNOSIS — R140 Abdominal distension (gaseous): Secondary | ICD-10-CM

## 2020-01-02 DIAGNOSIS — R6881 Early satiety: Secondary | ICD-10-CM | POA: Diagnosis not present

## 2020-01-02 MED FILL — FLUCONAZOLE 100 MG TAB: 100 | 21 days supply | Qty: 9 | Fill #0

## 2020-01-02 NOTE — Progress Notes (Signed)
Kristie [name] is a 39 y.o. female  Chief Complaint  Patient presents with  . Follow-up    Pt c/o GI issues x over 1 month.  Pt also is c/o ear preassure and headaches for 1+ year.    HPI: Kristie [name] is a 39 y.o. female who complains of GI symptoms x 7 weeks. She was seen virtually by me about 1 mo ago and after trial of omeprazole, pt also completed labs (CBC, BMP), h pylori antigen, c diff, and stool studies which were all normal/negative.    Today she notes having symptoms intermittently. Some days her appetite is good, other days she does not feel hungry, other days she eats only a few bites and then is full. She has intermittent nausea, abd pain, belching. She does have bloating at times. She feels the most significant and bothersome symptom for pt is nausea and early satiety.   Pt has tried probiotic, drinking ginger tea, eating ginger, pepto-bismol, omeprazole with significant or lasting relief of symptoms.  Symptoms initially began in late May after camping trip.   She has been treated for "yeast overgrowth" in the past x 2 by provider at [facility]. She had Rx called in for this and plans to start treatment today. She understands this is controversial but feels this is low risk and worth a try.   She notes being more irritable, anxious, tearful, depressed since 07/2019. She notes possible IBS.   She has had GI symptoms on/off x years. ? Stomach ulcers, treated for h pylori in her 20's.   Lab Results  Component Value Date   WBC 5.5 12/11/2019   HGB 13.2 12/11/2019   HCT 39.0 12/11/2019   MCV 93.2 12/11/2019   PLT 179.0 12/11/2019   Lab Results  Component Value Date   CREATININE 0.77 12/11/2019   Lab Results  Component Value Date   BUN 8 12/11/2019   Lab Results  Component Value Date   NA 137 12/11/2019   K 4.3 12/11/2019   CL 104 12/11/2019   CO2 27 12/11/2019   Lab Results  Component Value Date   ALT 17 08/07/2019   AST 20 08/07/2019    ALKPHOS 53 08/07/2019   BILITOT 0.5 08/07/2019   Component     Latest Ref Rng & Units 12/19/2019  H pylori Ag, Stl     Negative Negative   Component     Latest Ref Rng & Units 12/11/2019  Toxigenic C. Difficile by PCR     Negative Negative   Component     Latest Ref Rng & Units 12/11/2019  MICRO NUMBER:      [id number]  SPECIMEN QUALITY:      Adequate  Source      STOOL  STATUS:      FINAL  CONCENTRATE RESULT      No ova or parasites seen  TRICHROME RESULT      No ova or parasites seen  Comment      Routine Ova and Parasite exam may not detect some parasites that occasionally cause diarrheal illness. Test code(s) 92330 (Cryptosporidium Ag., DFA) and/or 10018 (Cyclospora and Isospora Exam) may be ordered to detect these parasites. One negative sample . . .   Component     Latest Ref Rng & Units 12/11/2019  Campylobacter, PCR     NOT DETECT NOT DETECTED  C. difficile Tox A/B, PCR     NOT DETECT NOT DETECTED  E coli 0157, PCR  NOT DETECT NOT DETECTED  E coli (ETEC) LT/ST PCR     NOT DETECT NOT DETECTED  E coli (STEC) stx1/stx2, PCR     NOT DETECT NOT DETECTED  Salmonella, PCR     NOT DETECT NOT DETECTED  Shigella, PCR     NOT DETECT NOT DETECTED  Norovirus, PCR     NOT DETECT NOT DETECTED  Rotavirus A, PCR     NOT DETECT NOT DETECTED  Giardia lamblia, PCR     NOT DETECT NOT DETECTED  Cryptosporidium, PCR     NOT DETECT NOT DETECTED     Past Medical History:  Diagnosis Date  . Anxiety   . Asthma, exercise induced   . Complication of anesthesia    Very sensitive to medicine  . Dysplasia of cervix, high grade CIN 2   . HNP (herniated nucleus pulposus) with myelopathy, cervical   . HPV in female   . Migraines   . Seasonal allergies     Past Surgical History:  Procedure Laterality Date  . ANTERIOR CERVICAL DECOMP/DISCECTOMY FUSION N/A 02/01/2018   Procedure: Anterior cervical decompression/discectomy/fusion Cervical five-six Cervical six-seven;  Surgeon:  Jovita Gamma, MD;  Location: [facility];  Service: Neurosurgery;  Laterality: N/A;  . LASER ABLATION OF THE CERVIX  2013   CIN-2  . VAGINAL CYST EXCISED  2001    Social History   Socioeconomic History  . Marital status: Single    Spouse name: Not on file  . Number of children: 0  . Years of education: Not on file  . Highest education level: Not on file  Occupational History  . Occupation: Programmer, multimedia: [employer]    Comment: [employer]  Tobacco Use  . Smoking status: Former Research scientist (life sciences)  . Smokeless tobacco: Former Systems developer    Quit date: 09/17/2003  Vaping Use  . Vaping Use: Never used  Substance and Sexual Activity  . Alcohol use: No  . Drug use: No  . Sexual activity: Yes    Birth control/protection: Condom  Other Topics Concern  . 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 Involved in [employer].       Prefers Naturopathic Medicine.    Social Determinants of Health   Financial Resource Strain:   . Difficulty of Paying Living Expenses:   Food Insecurity:   . Worried About Charity fundraiser in the Last Year:   . Arboriculturist in the Last Year:   Transportation Needs:   . Film/video editor (Medical):   Marland Kitchen Lack of Transportation (Non-Medical):   Physical Activity:   . Days of Exercise per Week:   . Minutes of Exercise per Session:   Stress:   . Feeling of Stress :   Social Connections:   . Frequency of Communication with Friends and Family:   . Frequency of Social Gatherings with Friends and Family:   . Attends Religious Services:   . Active Member of Clubs or Organizations:   . Attends Archivist Meetings:   Marland Kitchen Marital Status:   Intimate Partner Violence:   . Fear of Current or Ex-Partner:   . Emotionally Abused:   Marland Kitchen Physically Abused:   . Sexually Abused:     Family History  Problem Relation Age of Onset  . Hypertension Mother   . Breast cancer Maternal Aunt   . Prostate cancer Maternal Uncle   .  Hypertension Maternal Grandmother   .  Heart disease Maternal Grandmother   . Lung cancer Maternal Grandfather       There is no immunization history on file for this patient.  Outpatient Encounter Medications as of 01/02/2020  Medication Sig  . albuterol (VENTOLIN HFA) 108 (90 Base) MCG/ACT inhaler Inhale 2 puffs into the lungs every 4 (four) hours as needed for wheezing or shortness of breath.  . B Complex Vitamins (VITAMIN B COMPLEX PO) Take 1 tablet by mouth daily.  . Cholecalciferol (VITAMIN D PO) Take 1 capsule by mouth daily.  . diazepam (VALIUM) 2 MG tablet Take 1 tablet (2 mg total) by mouth every 8 (eight) hours as needed for anxiety.  . diclofenac (VOLTAREN) 75 MG EC tablet Take 1 tablet (75 mg total) by mouth 2 (two) times daily.  . fluconazole (DIFLUCAN) 150 MG tablet Take 1 tablet (150 mg total) by mouth daily. Take one tablet today.  May repeat in 3 days.  Marland Kitchen MAGNESIUM OXIDE PO Take 5 mLs by mouth 2 (two) times daily.  . Melatonin 10 MG TABS Take 10 mg by mouth at bedtime as needed (sleep).  . valACYclovir (VALTREX) 500 MG tablet Take 1 tablet (500 mg total) by mouth 2 (two) times daily.  Marland Kitchen VITAMIN D-VITAMIN K PO Take 1 tablet by mouth daily.  Marland Kitchen sulfamethoxazole-trimethoprim (BACTRIM DS) 800-160 MG tablet Take 1 tablet by mouth 2 (two) times daily. (Patient not taking: Reported on 12/06/2019)   No facility-administered encounter medications on file as of 01/02/2020.     ROS: Pertinent positives and negatives noted in HPI. Remainder of ROS non-contributory    Allergies  Allergen Reactions  . Acyclovir And Related Nausea Only and Other (See Comments)    Abdominal pain dizziness.  . Gabapentin Other (See Comments)    Caused symptoms of depression  . Tindamax [Tinidazole]     UNSPECIFIED REACTION   . Sertraline Hcl Other (See Comments)    Paranoia.     BP 100/62 (BP Location: Left Arm, Patient Position: Sitting, Cuff Size: Normal)   Pulse (!) 58   Temp 98.4 F (36.9  C) (Temporal)   Ht 5\' 6"  (1.676 m)   Wt 128 lb 12.8 oz (58.4 kg)   LMP 12/15/2019   SpO2 98%   BMI 20.79 kg/m   Wt Readings from Last 3 Encounters:  01/02/20 128 lb 12.8 oz (58.4 kg)  11/05/19 138 lb 12.8 oz (63 kg)  04/18/19 130 lb (59 kg)    Physical Exam Constitutional:      General: She is not in acute distress.    Appearance: Normal appearance. She is normal weight. She is not toxic-appearing.  Pulmonary:     Effort: No respiratory distress.  Skin:    General: Skin is warm and dry.  Neurological:     Mental Status: She is alert and oriented to person, place, and time.  Psychiatric:        Mood and Affect: Mood normal.        Behavior: Behavior normal.      A/P:  1. Abdominal pain, unspecified abdominal location 2. Abdominal bloating with cramps 3. Nausea 4. Early satiety 5. Diarrhea - labs (CBC, BMP) normal, stool studies negative, H pylori negative - pt has lost 10lbs in past 2 mo - likely some component of IBS with pts bloating, abd pain, loose and/or more frequent stools but near-persistent nausea and early satiety and decreased appetite are new and not c/w IBS - pt states she has been treated for "yeast  overgrowth" twice in the past for similar but not same symptoms and her symptoms have resolved. This has been done thru [facility]. She had Rx for "pulsed" course of diflucan called in today. Lengthy discussion with pt about this and although I'm not convinced about this dx, I do think the treatment is relatively low risk with minimal side effects so I am agreeable to her taking the med and she plans to do so - pt with chronic GI symptoms and notes being treated for "ulcers" a few times but this was a presumed dx and not confirmed by EGD. Symptoms have been intermittent, recurrent x years and I feel pt needs full GI eval. - Ambulatory referral to Gastroenterology   This visit occurred during the SARS-CoV-2 public health emergency.  Safety  protocols were in place, including screening questions prior to the visit, additional usage of staff PPE, and extensive cleaning of exam room while observing appropriate contact time as indicated for disinfecting solutions.

## 2020-01-06 ENCOUNTER — Encounter: Payer: Self-pay | Admitting: Family Medicine

## 2020-01-08 ENCOUNTER — Other Ambulatory Visit: Payer: Self-pay

## 2020-01-08 ENCOUNTER — Encounter: Payer: Self-pay | Admitting: Nurse Practitioner

## 2020-01-08 ENCOUNTER — Ambulatory Visit (INDEPENDENT_AMBULATORY_CARE_PROVIDER_SITE_OTHER): Payer: No Typology Code available for payment source | Admitting: Nurse Practitioner

## 2020-01-08 VITALS — BP 118/75 | Ht 67.0 in | Wt 125.4 lb

## 2020-01-08 DIAGNOSIS — Z01419 Encounter for gynecological examination (general) (routine) without abnormal findings: Secondary | ICD-10-CM

## 2020-01-08 NOTE — Progress Notes (Signed)
   Kristie [name] 1980-07-21 [id number]   History:  39 y.o. G0 presents for annual exam. Regular monthly cycle. Was seen 12/28/2019 in emergency room for vaginal discharge and treated for yeast infection with Diflucan with relief of symtoms. 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 2019 pap +ASCUS negative HPV. Not currently sexually active. Had cervical decompression/discectomy in 01/2018 with recurrent issues, followed by neurosurgery - Dr. Sherwood Gambler. Seeing holistic medicine doctor for recurrent yeast infections. Has not had one in a while.   Gynecologic History Patient's last menstrual period was 12/15/2019. Period Duration (Days): 3 DAYS Period Pattern: Regular Menstrual Flow: Light Menstrual Control: Panty liner Menstrual Control Change Freq (Hours): ON HEAVIER DAYS 3 OR 4 HOURS, LIGHT DAYS 1 LINER Dysmenorrhea: (!) Mild Dysmenorrhea Symptoms: Cramping Contraception: abstinence Last Pap: 01/01/2019. Results were: normal Last mammogram: never   Past medical history, past surgical history, family history and social history were all reviewed and documented in the EPIC chart.  ROS:  A ROS was performed and pertinent positives and negatives are included.  Exam:  Vitals:   01/08/20 1612  BP: 118/75  Weight: 125 lb 6.4 oz (56.9 kg)  Height: 5\' 7"  (1.702 m)   Body mass index is 19.64 kg/m.  General appearance:  Normal Thyroid:  Symmetrical, normal in size, without palpable masses or nodularity. Respiratory  Auscultation:  Clear without wheezing or rhonchi Cardiovascular  Auscultation:  Regular rate, without rubs, murmurs or gallops  Edema/varicosities:  Not grossly evident Abdominal  Soft,nontender, without masses, guarding or rebound.  Liver/spleen:  No organomegaly noted  Hernia:  None appreciated  Skin  Inspection:  Grossly normal   Breasts: Examined lying and sitting.   Right: Without masses, retractions, discharge or axillary adenopathy.   Left: Without masses,  retractions, discharge or axillary adenopathy. Gentitourinary   Inguinal/mons:  Normal without inguinal adenopathy  External genitalia:  Normal  BUS/Urethra/Skene's glands:  Normal  Vagina:  Normal  Cervix:  Normal  Uterus:  Anteverted, normal in size, shape and contour.  Midline and mobile  Adnexa/parametria:     Rt: Without masses or tenderness.   Lt: Without masses or tenderness.  Anus and perineum: Normal   Assessment/Plan:  39 y.o. G0 for annual exam.  Well female exam with routine gynecological exam - Education provided on SBEs, importance of preventative screenings, current guidelines, high calcium diet, regular exercise, and multivitamin daily. Pap with HPV typing today. Labs done elsewhere.   Screening for breast cancer - Discussed current guidelines for screenings and recommend scheduling soon. Information provided on [facility]  Follow up in 1 year for annual     [place], 4:38 PM 01/08/2020

## 2020-01-08 NOTE — Patient Instructions (Signed)
[facility] °[phone] °[address]  °[place], [place] [address] ° ° ° °Health Maintenance, Female °Adopting a healthy lifestyle and getting preventive care are important in promoting health and wellness. Ask your health care provider about: °· The right schedule for you to have regular tests and exams. °· Things you can do on your own to prevent diseases and keep yourself healthy. °What should I know about diet, weight, and exercise? °Eat a healthy diet ° °· Eat a diet that includes plenty of vegetables, fruits, low-fat dairy products, and lean protein. °· Do not eat a lot of foods that are high in solid fats, added sugars, or sodium. °Maintain a healthy weight °Body mass index (BMI) is used to identify weight problems. It estimates body fat based on height and weight. Your health care provider can help determine your BMI and help you achieve or maintain a healthy weight. °Get regular exercise °Get regular exercise. This is one of the most important things you can do for your health. Most adults should: °· Exercise for at least 150 minutes each week. The exercise should increase your heart rate and make you sweat (moderate-intensity exercise). °· Do strengthening exercises at least twice a week. This is in addition to the moderate-intensity exercise. °· Spend less time sitting. Even light physical activity can be beneficial. °Watch cholesterol and blood lipids °Have your blood tested for lipids and cholesterol at 41 years of age, then have this test every 5 years. °Have your cholesterol levels checked more often if: °· Your lipid or cholesterol levels are high. °· You are older than 40 years of age. °· You are at high risk for heart disease. °What should I know about cancer screening? °Depending on your health history and family history, you may need to have cancer screening at various ages. This may include screening for: °· Breast cancer. °· Cervical cancer. °· Colorectal  cancer. °· Skin cancer. °· Lung cancer. °What should I know about heart disease, diabetes, and high blood pressure? °Blood pressure and heart disease °· High blood pressure causes heart disease and increases the risk of stroke. This is more likely to develop in people who have high blood pressure readings, are of African descent, or are overweight. °· Have your blood pressure checked: °? Every 3-5 years if you are 18-39 years of age. °? Every year if you are 40 years old or older. °Diabetes °Have regular diabetes screenings. This checks your fasting blood sugar level. Have the screening done: °· Once every three years after age 40 if you are at a normal weight and have a low risk for diabetes. °· More often and at a younger age if you are overweight or have a high risk for diabetes. °What should I know about preventing infection? °Hepatitis B °If you have a higher risk for hepatitis B, you should be screened for this virus. Talk with your health care provider to find out if you are at risk for hepatitis B infection. °Hepatitis C °Testing is recommended for: °· Everyone born from 1945 through 1965. °· Anyone with known risk factors for hepatitis C. °Sexually transmitted infections (STIs) °· Get screened for STIs, including gonorrhea and chlamydia, if: °? You are sexually active and are younger than 41 years of age. °? You are older than 41 years of age and your health care provider tells you that you are at risk for this type of infection. °? Your sexual activity has changed since you   were last screened, and you are at increased risk for chlamydia or gonorrhea. Ask your health care provider if you are at risk. °· Ask your health care provider about whether you are at high risk for HIV. Your health care provider may recommend a prescription medicine to help prevent HIV infection. If you choose to take medicine to prevent HIV, you should first get tested for HIV. You should then be tested every 3 months for as long as  you are taking the medicine. °Pregnancy °· If you are about to stop having your period (premenopausal) and you may become pregnant, seek counseling before you get pregnant. °· Take 400 to 800 micrograms (mcg) of folic acid every day if you become pregnant. °· Ask for birth control (contraception) if you want to prevent pregnancy. °Osteoporosis and menopause °Osteoporosis is a disease in which the bones lose minerals and strength with aging. This can result in bone fractures. If you are 65 years old or older, or if you are at risk for osteoporosis and fractures, ask your health care provider if you should: °· Be screened for bone loss. °· Take a calcium or vitamin D supplement to lower your risk of fractures. °· Be given hormone replacement therapy (HRT) to treat symptoms of menopause. °Follow these instructions at home: °Lifestyle °· Do not use any products that contain nicotine or tobacco, such as cigarettes, e-cigarettes, and chewing tobacco. If you need help quitting, ask your health care provider. °· Do not use street drugs. °· Do not share needles. °· Ask your health care provider for help if you need support or information about quitting drugs. °Alcohol use °· Do not drink alcohol if: °? Your health care provider tells you not to drink. °? You are pregnant, may be pregnant, or are planning to become pregnant. °· If you drink alcohol: °? Limit how much you use to 0-1 drink a day. °? Limit intake if you are breastfeeding. °· Be aware of how much alcohol is in your drink. In the [place] one drink equals one 12 oz bottle of beer (355 mL), one 5 oz glass of wine (148 mL), or one 1½ oz glass of hard liquor (44 mL). °General instructions °· Schedule regular health, dental, and eye exams. °· Stay current with your vaccines. °· Tell your health care provider if: °? You often feel depressed. °? You have ever been abused or do not feel safe at home. °Summary °· Adopting a healthy lifestyle and getting preventive care are  important in promoting health and wellness. °· Follow your health care provider's instructions about healthy diet, exercising, and getting tested or screened for diseases. °· Follow your health care provider's instructions on monitoring your cholesterol and blood pressure. °This information is not intended to replace advice given to you by your health care provider. Make sure you discuss any questions you have with your health care provider. °Document Revised: 06/07/2018 Document Reviewed: 06/07/2018 °Elsevier Patient Education © 2020 [employer] Inc[employer] ° °

## 2020-01-10 LAB — PAP, TP IMAGING W/ HPV RNA, RFLX HPV TYPE 16,18/45: HPV DNA High Risk: NOT DETECTED

## 2020-03-07 ENCOUNTER — Encounter: Payer: Self-pay | Admitting: Gastroenterology

## 2020-03-07 ENCOUNTER — Encounter: Payer: Self-pay | Admitting: Family Medicine

## 2020-03-07 ENCOUNTER — Ambulatory Visit: Payer: No Typology Code available for payment source | Admitting: Gastroenterology

## 2020-03-07 VITALS — BP 100/60 | HR 64 | Ht 66.5 in | Wt 129.0 lb

## 2020-03-07 DIAGNOSIS — R109 Unspecified abdominal pain: Secondary | ICD-10-CM

## 2020-03-07 DIAGNOSIS — R140 Abdominal distension (gaseous): Secondary | ICD-10-CM

## 2020-03-07 MED ORDER — SUPREP BOWEL PREP KIT 17.5-3.13-1.6 GM/177ML PO SOLN
1.0000 | ORAL | 0 refills | Status: DC
Start: 2020-03-07 — End: 2020-04-17

## 2020-03-07 MED FILL — SUPREP BOWEL PREP KIT: 17.5-3.13-1 | 1 days supply | Qty: 354 | Fill #0

## 2020-03-07 NOTE — Patient Instructions (Signed)
You have been scheduled for an endoscopy and colonoscopy. Please follow the written instructions given to you at your visit today. Please pick up your prep supplies at the pharmacy within the next 1-3 days. If you use inhalers (even only as needed), please bring them with you on the day of your procedure.   We have sent the following medications to your pharmacy for you to pick up at your convenience: Suprep   If you are age 41 or older, your body mass index should be between 23-30. Your Body mass index is 20.51 kg/m. If this is out of the aforementioned range listed, please consider follow up with your Primary Care Provider.  If you are age 51 or younger, your body mass index should be between 19-25. Your Body mass index is 20.51 kg/m. If this is out of the aformentioned range listed, please consider follow up with your Primary Care Provider.    Due to recent changes in healthcare laws, you may see the results of your imaging and laboratory studies on MyChart before your provider has had a chance to review them.  We understand that in some cases there may be results that are confusing or concerning to you. Not all laboratory results come back in the same time frame and the provider may be waiting for multiple results in order to interpret others.  Please give [place] 48 hours in order for your provider to thoroughly review all the results before contacting the office for clarification of your results.   We will obtain records from your previous physician.   Thank you for choosing me and [facility].  Dr.Kimberly Beavers

## 2020-03-07 NOTE — Progress Notes (Addendum)
Referring Provider: Ronnald Nian, DO Primary Care Physician:  Ronnald Nian, DO  Reason for Consultation: Abdominal pain, diarrhea, nausea, vomiting   IMPRESSION:  Abdominal pain, nausea, bloating, post-prandial diarrhea    - intermittent symptoms for years, worse since hiking trip in May    - normal abdominal ultrasound 2020    - negative GI pathogen panel, C diff, O&P, H pylori stool antigen "Yeast overgrowth"     - treated with Diflucan x 3 by [name] with improvement of GI symptoms while on treatment  Unfortunately, the clinical significance of Candida or yeast detected in the GI tract is unclear.  It is not necessarily pathological.  That being said, small intestinal functional overgrowth has been described and must be considered.  With this diagnosis a low sugar diet is most appropriate.  Symptoms may be due to IBS or post-infectious IBS. Must exclude mimickers of disease including, but not limited to, IBD, celiac disease, gastroparesis, food intolerance SIBO.   I offered dicyclomine for symptomatic relief, particularly given her post-prandial diarrhea, as we proceed with her evaluation. She prefers to avoid medications to control her symptoms at this time.   I reached out to Dr. Janese Banks at the [facility] for additional guidance regarding SIFO.   PLAN: - Offered dicyclomine for treatment of symptoms, declined - Continue diet that avoids sugar - EGD and colonoscopy - Obtain prior records from our practice (colon and EGD >10 years) - SIBO breath test if endoscopic evaluation is negative  The nature of the procedure, as well as the risks, benefits, and alternatives were carefully and thoroughly reviewed with the patient. Ample time for discussion and questions allowed. The patient understood, was satisfied, and agreed to proceed.  Please see the "Patient Instructions" section for addition details about the plan.  HPI: Kristie [name] is a  39 y.o. female referred by Dr. [name] Lemma for further evaluation of abdominal pain.  The history is obtained through the patient and review of her electronic health record. 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  She has anxiety, exercise-induced asthma, chronic headaches, depression, recurrent urinary tract infections, and possible IBS.  She has a history of possible stomach ulcers and was treated for H. pylori in her 28s.  She notes that she had a colonoscopy and EGD performed with [facility] 12-14 years.  Unfortunately, I am unable to locate those results in EPIC.  Has had recurrent yeast infection since having HSV-1 in 2017.    She reports intermittent GI symptoms for years but worse over the last several months after a 70 mile hiking trip on the [place] in May.   She has intermittent nausea, abdominal pain, post-prandial dairrhea when eating sugar, heartburn and eructation that began after a camping trip in May.  There is frequent bloating and altered bowel habits. Associated anal itching, headaches. Triggered by eating fruits and sugars. Has removed removed all sugars from her diet without completing controlling her symptoms. Weight is stable. She has also tried probiotics, ginger tea, eating ginger, Pepto-Bismol, and omeprazole with incomplete relief of symptoms.  Dr. [name] Lemma treated her with omeprazole.  Evaluation in June included a normal CBC, BMP and normal/negative stool studies including C. difficile, ova and parasites, GI pathogen panel, and H. pylori antigen.  Abdominal ultrasound 01/04/2019 was normal.   She has been treated for "yeast overgrowth" in the past x 3 by provider at [facility], most recently over the summer. Notes dramatic improvement in  GI symptoms while she is on antifungal therapy with Diflucan. Completed 9 doses of 2.5 weeks in July and her symptoms recurred 2 weeks ago. In the past, her symptoms have been controlled for 6-8 months.   Uses boric  acid suppositories for prevention.    Notes increasing stress at work over the last couple of months related to the Covid vaccine requirement through [place].   She comments that recurrent yeast infections in the GI tract are "bizarre" but she feels much better with antifungal treatment.  She is curious about dosing for chronic with Diflucan.    Prefers to avoid prescription medications if possible.   Sister with food sensitivities with severe bloating. No known family history of colon cancer or polyps. No family history of uterine/endometrial cancer, pancreatic cancer or gastric/stomach cancer.   Past Medical History:  Diagnosis Date   Anxiety    Asthma, exercise induced    Complication of anesthesia    Very sensitive to medicine   Depression    Dysplasia of cervix, high grade CIN 2    HNP (herniated nucleus pulposus) with myelopathy, cervical    HPV in female    HSV-1 (herpes simplex virus 1) infection    Migraines    Seasonal allergies     Past Surgical History:  Procedure Laterality Date   ANTERIOR CERVICAL DECOMP/DISCECTOMY FUSION N/A 02/01/2018   Procedure: Anterior cervical decompression/discectomy/fusion Cervical five-six Cervical six-seven;  Surgeon: Jovita Gamma, MD;  Location: [facility];  Service: Neurosurgery;  Laterality: N/A;   LASER ABLATION OF THE CERVIX  2013   CIN-2   VAGINAL CYST EXCISED  2001    Current Outpatient Medications  Medication Sig Dispense Refill   albuterol (VENTOLIN HFA) 108 (90 Base) MCG/ACT inhaler Inhale 2 puffs into the lungs every 4 (four) hours as needed for wheezing or shortness of breath. 18 g 2   B Complex Vitamins (VITAMIN B COMPLEX PO) Take 1 tablet by mouth daily.     MAGNESIUM OXIDE PO Take 5 mLs by mouth 2 (two) times daily.     Melatonin 10 MG TABS Take 10 mg by mouth at bedtime as needed (sleep).     valACYclovir (VALTREX) 500 MG tablet Take 1 tablet (500 mg total) by mouth 2 (two) times daily. 180 tablet 2    VITAMIN D-VITAMIN K PO Take 1 tablet by mouth daily.     diazepam (VALIUM) 2 MG tablet Take 1 tablet (2 mg total) by mouth every 8 (eight) hours as needed for anxiety. (Patient not taking: Reported on 03/07/2020) 30 tablet 1   No current facility-administered medications for this visit.    Allergies as of 03/07/2020 - Review Complete 03/07/2020  Allergen Reaction Noted   Acyclovir and related Nausea Only and Other (See Comments) 12/12/2015   Neurontin [gabapentin] Other (See Comments) 05/30/2018   Tindamax [tinidazole]  12/08/2015   Zoloft [sertraline hcl]  12/31/2010    Family History  Problem Relation Age of Onset   Hypertension Mother    Anxiety disorder Mother    Breast cancer Maternal Aunt    Prostate cancer Maternal Uncle    Hypertension Maternal Grandmother    Lung cancer Maternal Grandfather     Social History   Socioeconomic History   Marital status: Single    Spouse name: Not on file   Number of children: 0   Years of education: Not on file   Highest education level: Not on file  Occupational History   Occupation: Therapist, sports  Employer: [employer]    Comment: [employer]  Tobacco Use   Smoking status: Former Smoker   Smokeless tobacco: Former Systems developer    Quit date: 09/17/2003  Vaping Use   Vaping Use: Never used  Substance and Sexual Activity   Alcohol use: No   Drug use: No   Sexual activity: Not Currently  Other Topics Concern   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 Involved in [employer].       Prefers Naturopathic Medicine.    Social Determinants of Health   Financial Resource Strain:    Difficulty of Paying Living Expenses: Not on file  Food Insecurity:    Worried About Charity fundraiser in the Last Year: Not on file   [employer] of Food in the Last Year: Not on file  Transportation Needs:    Lack of Transportation (Medical): Not on file   Lack of Transportation (Non-Medical):  Not on file  Physical Activity:    Days of Exercise per Week: Not on file   Minutes of Exercise per Session: Not on file  Stress:    Feeling of Stress : Not on file  Social Connections:    Frequency of Communication with Friends and Family: Not on file   Frequency of Social Gatherings with Friends and Family: Not on file   Attends Religious Services: Not on file   Active Member of Clubs or Organizations: Not on file   Attends Archivist Meetings: Not on file   Marital Status: Not on file  Intimate Partner Violence:    Fear of Current or Ex-Partner: Not on file   Emotionally Abused: Not on file   Physically Abused: Not on file   Sexually Abused: Not on file    Review of Systems: 12 system ROS is negative except as noted above with the addition of allergies, anxiety, back pain, depression, headaches, itching, menstrual pain, night sweats, insomnia, sore throat.   Physical Exam: General:   Alert,  well-nourished, pleasant and cooperative in NAD Head:  Normocephalic and atraumatic. Eyes:  Sclera clear, no icterus.   Conjunctiva pink. Ears:  Normal auditory acuity. Nose:  No deformity, discharge,  or lesions. Mouth:  No deformity or lesions.   Neck:  Supple; no masses or thyromegaly. Lungs:  Clear throughout to auscultation.   No wheezes. Heart:  Regular rate and rhythm; no murmurs. Abdomen:  Soft, thin, nontender, nondistended, normal bowel sounds, no rebound or guarding. No hepatosplenomegaly.  I am unable to reproduce her pain on exam. Rectal:  Deferred  Msk:  Symmetrical. No boney deformities LAD: No inguinal or umbilical LAD Extremities:  No clubbing or edema. Neurologic:  Alert and  oriented x4;  grossly nonfocal Skin:  Intact without significant lesions or rashes. Psych:  Alert and cooperative. Normal mood and affect.    Hayk Divis L. Tarri Glenn, MD, MPH 03/07/2020, 9:14 AM

## 2020-03-11 MED ORDER — FLUCONAZOLE 100 MG PO TABS
100.0000 mg | ORAL_TABLET | Freq: Every day | ORAL | 0 refills | Status: DC
Start: 2020-03-11 — End: 2020-12-10

## 2020-03-11 MED FILL — FLUCONAZOLE 100 MG TAB: 100 | 9 days supply | Qty: 9 | Fill #0

## 2020-04-04 ENCOUNTER — Encounter: Payer: Self-pay | Admitting: Family Medicine

## 2020-04-04 ENCOUNTER — Telehealth (INDEPENDENT_AMBULATORY_CARE_PROVIDER_SITE_OTHER): Payer: No Typology Code available for payment source | Admitting: Family Medicine

## 2020-04-04 VITALS — Wt 133.0 lb

## 2020-04-04 DIAGNOSIS — R519 Headache, unspecified: Secondary | ICD-10-CM | POA: Diagnosis not present

## 2020-04-04 DIAGNOSIS — R5383 Other fatigue: Secondary | ICD-10-CM

## 2020-04-04 NOTE — Progress Notes (Signed)
Virtual Visit via Video Note  I connected with Kristie [name] on 04/04/20 at  9:30 AM EDT by a video enabled telemedicine application and verified that I am speaking with the correct person using two identifiers. Location patient: home Location provider: work  Persons participating in the virtual visit: patient, provider  I discussed the limitations of evaluation and management by telemedicine and the availability of in person appointments. The patient expressed understanding and agreed to proceed.  Chief Complaint  Patient presents with  . Acute Visit    HA's, ear pressure, fatigue x 1 weeks   HPI: Kristie [name] is a 39 y.o. female who complains of fatigue, headache, ear pressure x 1 week.  No runny nose, sneezing, fever, chills, sore throat. No cough. No new/worse GI symptoms - occasional nausea.  She feels the headache gets worse after eating.  She is not able to exercise like she normally does.  She is taking diclofenac for her neck and arm but does not feel it is helping. She was also taking valium for her neck pain at night x 3 days.  She is going to [employer] to get IV for mold exposure since the vacation rental she was staying in was found to have mold. She noted increased stress 4+ weeks ago before covid vaccine exemption was approved.    Past Medical History:  Diagnosis Date  . Anxiety   . Asthma, exercise induced   . Complication of anesthesia    Very sensitive to medicine  . Depression   . Dysplasia of cervix, high grade CIN 2   . HNP (herniated nucleus pulposus) with myelopathy, cervical   . HPV in female   . HSV-1 (herpes simplex virus 1) infection   . Migraines   . Seasonal allergies     Past Surgical History:  Procedure Laterality Date  . ANTERIOR CERVICAL DECOMP/DISCECTOMY FUSION N/A 02/01/2018   Procedure: Anterior cervical decompression/discectomy/fusion Cervical five-six Cervical six-seven;  Surgeon: Jovita Gamma, MD;  Location: [facility];  Service:  Neurosurgery;  Laterality: N/A;  . LASER ABLATION OF THE CERVIX  2013   CIN-2  . VAGINAL CYST EXCISED  2001    Family History  Problem Relation Age of Onset  . Hypertension Mother   . Anxiety disorder Mother   . Breast cancer Maternal Aunt   . Prostate cancer Maternal Uncle   . Hypertension Maternal Grandmother   . Lung cancer Maternal Grandfather     Social History   Tobacco Use  . Smoking status: Former Smoker    Types: Cigarettes    Quit date: 2005    Years since quitting: 16.7  . Smokeless tobacco: Never Used  Vaping Use  . Vaping Use: Never used  Substance Use Topics  . Alcohol use: No  . Drug use: No     Current Outpatient Medications:  .  albuterol (VENTOLIN HFA) 108 (90 Base) MCG/ACT inhaler, Inhale 2 puffs into the lungs every 4 (four) hours as needed for wheezing or shortness of breath., Disp: 18 g, Rfl: 2 .  B Complex Vitamins (VITAMIN B COMPLEX PO), Take 1 tablet by mouth daily., Disp: , Rfl:  .  diazepam (VALIUM) 2 MG tablet, Take 2 mg by mouth every 6 (six) hours as needed for anxiety., Disp: , Rfl:  .  MAGNESIUM OXIDE PO, Take 5 mLs by mouth 2 (two) times daily., Disp: , Rfl:  .  Melatonin 10 MG TABS, Take 10 mg by mouth at bedtime as needed (sleep).,  Disp: , Rfl:  .  Na Sulfate-K Sulfate-Mg Sulf (SUPREP BOWEL PREP KIT) 17.5-3.13-1.6 GM/177ML SOLN, Take 1 kit by mouth as directed. For colonoscopy prep, Disp: 354 mL, Rfl: 0 .  valACYclovir (VALTREX) 500 MG tablet, Take 1 tablet (500 mg total) by mouth 2 (two) times daily., Disp: 180 tablet, Rfl: 2 .  VITAMIN D-VITAMIN K PO, Take 1 tablet by mouth daily., Disp: , Rfl:   Allergies  Allergen Reactions  . Acyclovir And Related Nausea Only and Other (See Comments)    Abdominal pain dizziness.  . Neurontin [Gabapentin] Other (See Comments)    Caused symptoms of depression  . Tindamax [Tinidazole]     UNSPECIFIED REACTION   . Zoloft [Sertraline Hcl]     paranoia      ROS: See pertinent positives and  negatives per HPI.   EXAM:  VITALS per patient if applicable: Wt 133 lb (60.3 kg) Comment: pt reprted  BMI 21.15 kg/m    GENERAL: alert, oriented, appears well and in no acute distress  HEENT: atraumatic, conjunctiva clear, no obvious abnormalities on inspection of external nose and ears  NECK: normal movements of the head and neck  LUNGS: on inspection no signs of respiratory distress, breathing rate appears normal, no obvious gross SOB, gasping or wheezing, no conversational dyspnea  CV: no obvious cyanosis  PSYCH/NEURO: pleasant and cooperative, no obvious depression or anxiety, speech and thought processing grossly intact   ASSESSMENT AND PLAN:  1. Fatigue, unspecified type 2. Acute nonintractable headache, unspecified headache type - symptoms x 1 week - no other symptoms - pt thinks mold exposure in vacation home triggered this and is scheduled for IV infusion/treatment for mold with [facility] on Monday - I think there is a large psychosomatic component to pts symptoms - I don't think labs needs to be done at this time (done in 07/2019 and 11/2019) - pt will f/u with [facility] and then had appt for colonoscopy next week to further eval ongoing but mild GI symptoms    I discussed the assessment and treatment plan with the patient. The patient was provided an opportunity to ask questions and all were answered. The patient agreed with the plan and demonstrated an understanding of the instructions.   The patient was advised to call back or seek an in-person evaluation if the symptoms worsen or if the condition fails to improve as anticipated.   Letta Median, DO

## 2020-04-10 ENCOUNTER — Encounter: Payer: No Typology Code available for payment source | Admitting: Family Medicine

## 2020-04-15 ENCOUNTER — Encounter: Payer: Self-pay | Admitting: Gastroenterology

## 2020-04-17 ENCOUNTER — Encounter: Payer: Self-pay | Admitting: Gastroenterology

## 2020-04-17 ENCOUNTER — Other Ambulatory Visit: Payer: Self-pay | Admitting: Family Medicine

## 2020-04-17 ENCOUNTER — Other Ambulatory Visit: Payer: Self-pay

## 2020-04-17 ENCOUNTER — Other Ambulatory Visit: Payer: Self-pay | Admitting: Gastroenterology

## 2020-04-17 ENCOUNTER — Ambulatory Visit (AMBULATORY_SURGERY_CENTER): Payer: No Typology Code available for payment source | Admitting: Gastroenterology

## 2020-04-17 VITALS — BP 103/67 | HR 50 | Temp 97.5°F | Resp 27 | Ht 66.0 in | Wt 129.0 lb

## 2020-04-17 DIAGNOSIS — B009 Herpesviral infection, unspecified: Secondary | ICD-10-CM

## 2020-04-17 DIAGNOSIS — K3189 Other diseases of stomach and duodenum: Secondary | ICD-10-CM | POA: Diagnosis not present

## 2020-04-17 DIAGNOSIS — R197 Diarrhea, unspecified: Secondary | ICD-10-CM

## 2020-04-17 DIAGNOSIS — R140 Abdominal distension (gaseous): Secondary | ICD-10-CM

## 2020-04-17 DIAGNOSIS — K2980 Duodenitis without bleeding: Secondary | ICD-10-CM | POA: Diagnosis not present

## 2020-04-17 DIAGNOSIS — R109 Unspecified abdominal pain: Secondary | ICD-10-CM | POA: Diagnosis not present

## 2020-04-17 DIAGNOSIS — K259 Gastric ulcer, unspecified as acute or chronic, without hemorrhage or perforation: Secondary | ICD-10-CM | POA: Diagnosis not present

## 2020-04-17 DIAGNOSIS — K319 Disease of stomach and duodenum, unspecified: Secondary | ICD-10-CM

## 2020-04-17 DIAGNOSIS — R110 Nausea: Secondary | ICD-10-CM

## 2020-04-17 MED ORDER — PANTOPRAZOLE SODIUM 40 MG PO TBEC
DELAYED_RELEASE_TABLET | ORAL | 0 refills | Status: DC
Start: 2020-04-17 — End: 2020-08-08

## 2020-04-17 MED ORDER — SODIUM CHLORIDE 0.9 % IV SOLN: 500.0000 mL | Freq: Once | INTRAVENOUS | Status: DC

## 2020-04-17 MED FILL — PANTOPRAZOLE SOD DR 40 MG T: 40 | 84 days supply | Qty: 150 | Fill #0

## 2020-04-17 MED FILL — CLINDAMYCIN PHOSP 1% LOTION: 1 | 30 days supply | Qty: 60 | Fill #1

## 2020-04-17 MED FILL — DICLOFENAC SODIUM 75 MG TAB: 75 | 30 days supply | Qty: 60 | Fill #1

## 2020-04-17 NOTE — Op Note (Signed)
[facility] Patient Name: Kristie [name] Procedure Date: 04/17/2020 2:26 PM MRN: [id number] Endoscopist: Thornton Park MD, MD Age: 39 Referring MD:  Date of Birth: June 15, 1981 Gender: Female Account #: [id number] Procedure:                Upper GI endoscopy Indications:              Abdominal pain, nausea, bloating, post-prandial                            diarrhea                           - intermittent symptoms for years, worse since                            hiking trip in May                           - normal abdominal ultrasound 2020                           - negative GI pathogen panel, C diff, O&P, H pylori                            stool antigen                           "Yeast overgrowth"                           - treated with Diflucan x 3 by [name] with improvement of GI symptoms while                            on treatment Medicines:                Monitored Anesthesia Care Procedure:                Pre-Anesthesia Assessment:                           - Prior to the procedure, a History and Physical                            was performed, and patient medications and                            allergies were reviewed. The patient's tolerance of                            previous anesthesia was also reviewed. The risks                            and benefits of the procedure and the sedation  options and risks were discussed with the patient.                            All questions were answered, and informed consent                            was obtained. Prior Anticoagulants: The patient has                            taken no previous anticoagulant or antiplatelet                            agents. ASA Grade Assessment: II - A patient with                            mild systemic disease. After reviewing the risks                            and benefits, the patient was deemed in                             satisfactory condition to undergo the procedure.                           After obtaining informed consent, the endoscope was                            passed under direct vision. Throughout the                            procedure, the patient's blood pressure, pulse, and                            oxygen saturations were monitored continuously. The                            Endoscope was introduced through the mouth, and                            advanced to the third part of duodenum. The upper                            GI endoscopy was accomplished without difficulty.                            The patient tolerated the procedure well. Scope In: Scope Out: Findings:                 The examined esophagus was normal.                           One non-bleeding cratered gastric ulcer with no  stigmata of bleeding was found in the prepyloric                            region of the stomach. The lesion was 4 mm in                            largest dimension. Biopsies were taken from the                            antrum, body, and fundus with a cold forceps for                            histology. Estimated blood loss was minimal.                           Diffuse mildly erythematous mucosa without active                            bleeding and with no stigmata of bleeding was found                            in the duodenal bulb. Biopsies were taken with a                            cold forceps for histology. Estimated blood loss                            was minimal. Complications:            No immediate complications. Estimated blood loss:                            Minimal. Estimated Blood Loss:     Estimated blood loss was minimal. Impression:               - Normal esophagus.                           - Non-bleeding gastric ulcer with no stigmata of                            bleeding. Biopsied.                            - Erythematous duodenopathy. Biopsied. Recommendation:           - Patient has a contact number available for                            emergencies. The signs and symptoms of potential                            delayed complications were discussed with the                            patient. Return  to normal activities tomorrow.                            Written discharge instructions were provided to the                            patient.                           - Resume previous diet.                           - Continue present medications.                           - Pantoprazole 40 mg BID x 10 weeks, then reduce to                            40 mg QD x 1 week, then QOD x 1 week, then                            discontinue. Resume with any recurrent symptoms.                           - No aspirin, ibuprofen, naproxen, or other                            non-steroidal anti-inflammatory drugs.                           - Await pathology results. Thornton Park MD, MD 04/17/2020 3:04:39 PM This report has been signed electronically.

## 2020-04-17 NOTE — Progress Notes (Signed)
Called to room to assist during endoscopic procedure.  Patient ID and intended procedure confirmed with present staff. Received instructions for my participation in the procedure from the performing physician.  

## 2020-04-17 NOTE — Progress Notes (Signed)
Lidocaine 100mg IV given to blunt gag reflex 

## 2020-04-17 NOTE — Patient Instructions (Signed)
The biopsies taken today have been sent for pathology.  The results can take 1-3 weeks to receive.    You may resume your previous diet and medication schedule.  Avoid taking any aspirin, ibuprofen, naproxen, or other NSAIDs  Thank you for allowing [place] to care for you today!!!   YOU HAD AN ENDOSCOPIC PROCEDURE TODAY AT [facility]:   Refer to the procedure report that was given to you for any specific questions about what was found during the examination.  If the procedure report does not answer your questions, please call your gastroenterologist to clarify.  If you requested that your care partner not be given the details of your procedure findings, then the procedure report has been included in a sealed envelope for you to review at your convenience later.  YOU SHOULD EXPECT: Some feelings of bloating in the abdomen. Passage of more gas than usual.  Walking can help get rid of the air that was put into your GI tract during the procedure and reduce the bloating. If you had a lower endoscopy (such as a colonoscopy or flexible sigmoidoscopy) you may notice spotting of blood in your stool or on the toilet paper. If you underwent a bowel prep for your procedure, you may not have a normal bowel movement for a few days.  Please Note:  You might notice some irritation and congestion in your nose or some drainage.  This is from the oxygen used during your procedure.  There is no need for concern and it should clear up in a day or so.  SYMPTOMS TO REPORT IMMEDIATELY:   Following lower endoscopy (colonoscopy or flexible sigmoidoscopy):  Excessive amounts of blood in the stool  Significant tenderness or worsening of abdominal pains  Swelling of the abdomen that is new, acute  Fever of 100F or higher   Following upper endoscopy (EGD)  Vomiting of blood or coffee ground material  New chest pain or pain under the shoulder blades  Painful or persistently difficult swallowing  New  shortness of breath  Fever of 100F or higher  Black, tarry-looking stools  For urgent or emergent issues, a gastroenterologist can be reached at any hour by calling [phone]. Do not use MyChart messaging for urgent concerns.    DIET:  We do recommend a small meal at first, but then you may proceed to your regular diet.  Drink plenty of fluids but you should avoid alcoholic beverages for 24 hours.  ACTIVITY:  You should plan to take it easy for the rest of today and you should NOT DRIVE or use heavy machinery until tomorrow (because of the sedation medicines used during the test).    FOLLOW UP: Our staff will call the number listed on your records 48-72 hours following your procedure to check on you and address any questions or concerns that you may have regarding the information given to you following your procedure. If we do not reach you, we will leave a message.  We will attempt to reach you two times.  During this call, we will ask if you have developed any symptoms of COVID 19. If you develop any symptoms (ie: fever, flu-like symptoms, shortness of breath, cough etc.) before then, please call [phone].  If you test positive for Covid 19 in the 2 weeks post procedure, please call and report this information to [place].    If any biopsies were taken you will be contacted by phone or by letter within the next 1-3  weeks.  Please call us at [phone] if you have not heard about the biopsies in 3 weeks.    SIGNATURES/CONFIDENTIALITY: You and/or your care partner have signed paperwork which will be entered into your electronic medical record.  These signatures attest to the fact that that the information above on your After Visit Summary has been reviewed and is understood.  Full responsibility of the confidentiality of this discharge information lies with you and/or your care-partner.

## 2020-04-17 NOTE — Progress Notes (Signed)
Pt Drowsy. VSS. To PACU, report to RN. No anesthetic complications noted.  

## 2020-04-17 NOTE — Op Note (Signed)
[place] Patient Name: Kristie [name] Procedure Date: 04/17/2020 2:25 PM MRN: [id number] Endoscopist: Thornton Park MD, MD Age: 39 Referring MD:  Date of Birth: [birth year] Gender: Female Account #: [id number] Procedure:                Colonoscopy Indications:              Abdominal pain, nausea, bloating, post-prandial                            diarrhea                           - intermittent symptoms for years, worse since                            hiking trip in May                           - normal abdominal ultrasound 2020                           - negative GI pathogen panel, C diff, O&P, H pylori                            stool antigen                           "Yeast overgrowth"                           - treated with Diflucan x 3 by [name]                            [facility] with improvement of GI symptoms while                            on treatment Medicines:                Monitored Anesthesia Care Procedure:                Pre-Anesthesia Assessment:                           - Prior to the procedure, a History and Physical                            was performed, and patient medications and                            allergies were reviewed. The patient's tolerance of                            previous anesthesia was also reviewed. The risks                            and benefits of the procedure and the sedation  options and risks were discussed with the patient.                            All questions were answered, and informed consent                            was obtained. Prior Anticoagulants: The patient has                            taken no previous anticoagulant or antiplatelet                            agents. ASA Grade Assessment: II - A patient with                            mild systemic disease. After reviewing the risks                            and benefits, the patient was deemed in                             satisfactory condition to undergo the procedure.                           After obtaining informed consent, the colonoscope                            was passed under direct vision. Throughout the                            procedure, the patient's blood pressure, pulse, and                            oxygen saturations were monitored continuously. The                            Colonoscope was introduced through the anus and                            advanced to the 3 cm into the ileum. The                            colonoscopy was performed without difficulty. The                            patient tolerated the procedure well. The quality                            of the bowel preparation was good. The terminal                            ileum, ileocecal valve, appendiceal orifice, and  rectum were photographed. Scope In: 2:42:53 PM Scope Out: 9:76:73 PM Scope Withdrawal Time: 0 hours 10 minutes 25 seconds  Total Procedure Duration: 0 hours 13 minutes 25 seconds  Findings:                 The perianal and digital rectal examinations were                            normal.                           The colon (entire examined portion) appeared                            normal. Biopsies for histology were taken with a                            cold forceps from the right colon and left colon                            for evaluation of microscopic colitis. Estimated                            blood loss was minimal.                           The terminal ileum appeared normal.                           The exam was otherwise without abnormality on                            direct and retroflexion views. Complications:            No immediate complications. Estimated blood loss:                            Minimal. Estimated Blood Loss:     Estimated blood loss was minimal. Impression:               - The entire examined colon is  normal. Biopsied.                           - The examined portion of the ileum was normal.                           - The examination was otherwise normal on direct                            and retroflexion views. Recommendation:           - Patient has a contact number available for                            emergencies. The signs and symptoms of potential  delayed complications were discussed with the                            patient. Return to normal activities tomorrow.                            Written discharge instructions were provided to the                            patient.                           - Resume previous diet.                           - Continue present medications.                           - Await pathology results.                           - Repeat colonoscopy in 10 years for surveillance,                            earlier with new symptoms.                           - Emerging evidence supports eating a diet of                            fruits, vegetables, grains, calcium, and yogurt                            while reducing red meat and alcohol may reduce the                            risk of colon cancer. Thornton Park MD, MD 04/17/2020 3:07:07 PM This report has been signed electronically.

## 2020-04-21 ENCOUNTER — Other Ambulatory Visit: Payer: Self-pay | Admitting: Family Medicine

## 2020-04-21 ENCOUNTER — Telehealth: Payer: Self-pay

## 2020-04-21 DIAGNOSIS — B009 Herpesviral infection, unspecified: Secondary | ICD-10-CM

## 2020-04-21 NOTE — Telephone Encounter (Signed)
  Follow up Call-  Call back number 04/17/2020  Post procedure Call Back phone  # [phone]  Permission to leave phone message Yes  Some recent data might be hidden     Patient questions:  Do you have a fever, pain , or abdominal swelling? No. Pain Score  0 *  Have you tolerated food without any problems? Yes.    Have you been able to return to your normal activities? Yes.    Do you have any questions about your discharge instructions: Diet   No. Medications  No. Follow up visit  No.  Do you have questions or concerns about your Care? No.  Actions: * If pain score is 4 or above: No action needed, pain <4.  1. Have you developed a fever since your procedure? no  2.   Have you had an respiratory symptoms (SOB or cough) since your procedure? no  3.   Have you tested positive for COVID 19 since your procedure no  4.   Have you had any family members/close contacts diagnosed with the COVID 19 since your procedure?  no   If yes to any of these questions please route to Joylene John, RN and Joella Prince, RN

## 2020-04-21 NOTE — Telephone Encounter (Signed)
Received a refill request for:  Valtrex 500 mg LR 10/09/18 #180, 2 rf's B(by Dr Juleen China) [place] 04/04/20 (for fatigue) FOV  None scheduled.   Please review and advise.   Thanks.  Dm/cma

## 2020-04-23 ENCOUNTER — Other Ambulatory Visit: Payer: Self-pay | Admitting: Student

## 2020-04-23 ENCOUNTER — Other Ambulatory Visit: Payer: Self-pay | Admitting: Family Medicine

## 2020-04-23 DIAGNOSIS — M542 Cervicalgia: Secondary | ICD-10-CM

## 2020-04-23 MED FILL — VALACYCLOVIR HCL 500 MG TAB: 500 | 90 days supply | Qty: 180 | Fill #0

## 2020-05-07 ENCOUNTER — Other Ambulatory Visit: Payer: Self-pay

## 2020-05-07 ENCOUNTER — Ambulatory Visit
Admission: RE | Admit: 2020-05-07 | Discharge: 2020-05-07 | Disposition: A | Discharge status: Home / Self Care | Payer: No Typology Code available for payment source | Source: Ambulatory Visit | Attending: Student | Admitting: Student

## 2020-05-07 DIAGNOSIS — M542 Cervicalgia: Secondary | ICD-10-CM

## 2020-05-07 MED ORDER — OMEPRAZOLE 40 MG PO CPDR
40.0000 mg | DELAYED_RELEASE_CAPSULE | Freq: Every day | ORAL | 3 refills | Status: DC
Start: 2020-05-07 — End: 2020-08-08

## 2020-05-08 ENCOUNTER — Other Ambulatory Visit: Payer: Self-pay

## 2020-05-08 ENCOUNTER — Telehealth: Payer: Self-pay | Admitting: Gastroenterology

## 2020-05-08 MED ORDER — OMEPRAZOLE 40 MG PO CPDR
40.0000 mg | DELAYED_RELEASE_CAPSULE | Freq: Two times a day (BID) | ORAL | 3 refills | Status: DC
Start: 2020-05-08 — End: 2020-08-08

## 2020-05-08 MED FILL — OMEPRAZOLE 40 MG CPDR: 40 | 30 days supply | Qty: 60 | Fill #0

## 2020-05-08 NOTE — Telephone Encounter (Signed)
[facility] is requesting for the pt's omeprazole to be changed to twice a day per pt, the prescription they have now states to take once a day.

## 2020-05-08 NOTE — Telephone Encounter (Signed)
Spoke with pharmacy and let them know that another script has been sent in for the BID dosing.

## 2020-05-13 DIAGNOSIS — M5416 Radiculopathy, lumbar region: Secondary | ICD-10-CM | POA: Insufficient documentation

## 2020-06-11 ENCOUNTER — Other Ambulatory Visit: Payer: Self-pay

## 2020-06-11 MED ORDER — FAMOTIDINE 20 MG PO TABS: 20.0000 mg | ORAL_TABLET | Freq: Two times a day (BID) | ORAL | 3 refills | Status: DC

## 2020-06-11 MED FILL — FAMOTIDINE 20 MG TABLET: 20 | 90 days supply | Qty: 180 | Fill #0

## 2020-06-12 ENCOUNTER — Encounter: Payer: Self-pay | Admitting: Gastroenterology

## 2020-07-25 ENCOUNTER — Ambulatory Visit (AMBULATORY_SURGERY_CENTER): Payer: No Typology Code available for payment source

## 2020-07-25 ENCOUNTER — Other Ambulatory Visit: Payer: Self-pay

## 2020-07-25 VITALS — Ht 66.5 in | Wt 130.0 lb

## 2020-07-25 DIAGNOSIS — Z01818 Encounter for other preprocedural examination: Secondary | ICD-10-CM

## 2020-07-25 DIAGNOSIS — K259 Gastric ulcer, unspecified as acute or chronic, without hemorrhage or perforation: Secondary | ICD-10-CM

## 2020-07-25 NOTE — Progress Notes (Signed)
Pt verified name, DOB, address and insurance during PV today.   Pt mailed instruction packet to included paper to complete and mail back to [facility] with addressed and stamped envelope, Emmi video, copy of consent form to read and not return, and instructions. . PV completed over the phone. Pt encouraged to call with questions or issues    No allergies to soy or egg Pt is not on blood thinners or diet pills Denies issues with sedation/intubation Denies atrial flutter/fib Denies constipation   Emmi instructions given to pt  Pt is aware of Covid safety and care partner requirements.  Self report wt:  130 lb

## 2020-08-05 ENCOUNTER — Other Ambulatory Visit: Payer: Self-pay

## 2020-08-06 LAB — SARS CORONAVIRUS 2 (TAT 6-24 HRS): SARS Coronavirus 2: NEGATIVE

## 2020-08-08 ENCOUNTER — Other Ambulatory Visit: Payer: Self-pay

## 2020-08-08 ENCOUNTER — Encounter: Payer: Self-pay | Admitting: Gastroenterology

## 2020-08-08 ENCOUNTER — Ambulatory Visit (AMBULATORY_SURGERY_CENTER): Payer: No Typology Code available for payment source | Admitting: Gastroenterology

## 2020-08-08 VITALS — BP 102/66 | HR 46 | Temp 97.7°F | Resp 15 | Ht 66.5 in | Wt 130.0 lb

## 2020-08-08 DIAGNOSIS — K3189 Other diseases of stomach and duodenum: Secondary | ICD-10-CM

## 2020-08-08 DIAGNOSIS — K319 Disease of stomach and duodenum, unspecified: Secondary | ICD-10-CM

## 2020-08-08 DIAGNOSIS — K259 Gastric ulcer, unspecified as acute or chronic, without hemorrhage or perforation: Secondary | ICD-10-CM

## 2020-08-08 MED ORDER — SODIUM CHLORIDE 0.9 % IV SOLN: 500.0000 mL | Freq: Once | INTRAVENOUS | Status: DC

## 2020-08-08 NOTE — Progress Notes (Signed)
No problems noted in the recovery room. maw 

## 2020-08-08 NOTE — Patient Instructions (Addendum)
NO NSAIDS (IBUPROFEN, ADVIL, ALEVE, AND MOTRIN); TYLENOL IS OK TO TAKE if needed. You may resume your current medications today. Await biopsy results.  May take 1-3 weeks to receive pathology results. Please call if any questions or concerns.     YOU HAD AN ENDOSCOPIC PROCEDURE TODAY AT [facility]:   Refer to the procedure report that was given to you for any specific questions about what was found during the examination.  If the procedure report does not answer your questions, please call your gastroenterologist to clarify.  If you requested that your care partner not be given the details of your procedure findings, then the procedure report has been included in a sealed envelope for you to review at your convenience later.  YOU SHOULD EXPECT: Some feelings of bloating in the abdomen. Passage of more gas than usual.  Walking can help get rid of the air that was put into your GI tract during the procedure and reduce the bloating. If you had a lower endoscopy (such as a colonoscopy or flexible sigmoidoscopy) you may notice spotting of blood in your stool or on the toilet paper. If you underwent a bowel prep for your procedure, you may not have a normal bowel movement for a few days.  Please Note:  You might notice some irritation and congestion in your nose or some drainage.  This is from the oxygen used during your procedure.  There is no need for concern and it should clear up in a day or so.  SYMPTOMS TO REPORT IMMEDIATELY:   Following upper endoscopy (EGD)  Vomiting of blood or coffee ground material  New chest pain or pain under the shoulder blades  Painful or persistently difficult swallowing  New shortness of breath  Fever of 100F or higher  Black, tarry-looking stools  For urgent or emergent issues, a gastroenterologist can be reached at any hour by calling [phone]. Do not use MyChart messaging for urgent concerns.    DIET:  We do recommend a small meal  at first, but then you may proceed to your regular diet.  Drink plenty of fluids but you should avoid alcoholic beverages for 24 hours.  ACTIVITY:  You should plan to take it easy for the rest of today and you should NOT DRIVE or use heavy machinery until tomorrow (because of the sedation medicines used during the test).    FOLLOW UP: Our staff will call the number listed on your records 48-72 hours following your procedure to check on you and address any questions or concerns that you may have regarding the information given to you following your procedure. If we do not reach you, we will leave a message.  We will attempt to reach you two times.  During this call, we will ask if you have developed any symptoms of COVID 19. If you develop any symptoms (ie: fever, flu-like symptoms, shortness of breath, cough etc.) before then, please call [phone].  If you test positive for Covid 19 in the 2 weeks post procedure, please call and report this information to [place].    If any biopsies were taken you will be contacted by phone or by letter within the next 1-3 weeks.  Please call us at [phone] if you have not heard about the biopsies in 3 weeks.    SIGNATURES/CONFIDENTIALITY: You and/or your care partner have signed paperwork which will be entered into your electronic medical record.  These signatures attest to the fact that that  the information above on your After Visit Summary has been reviewed and is understood.  Full responsibility of the confidentiality of this discharge information lies with you and/or your care-partner.

## 2020-08-08 NOTE — Progress Notes (Signed)
Called to room to assist during endoscopic procedure.  Patient ID and intended procedure confirmed with present staff. Received instructions for my participation in the procedure from the performing physician.  

## 2020-08-08 NOTE — Progress Notes (Signed)
To PACU, VSS. Report to [email or web address] 

## 2020-08-08 NOTE — Op Note (Signed)
[place] Patient Name: Kristie [name] Procedure Date: 08/08/2020 11:03 AM MRN: [id number] Endoscopist: Thornton Park MD, MD Age: 40 Referring MD:  Date of Birth: [birth year] Gender: Female Account #: [id number] Procedure:                Upper GI endoscopy Indications:              Follow-up of acute gastric ulcer                           H pylori gastric antral ulcer 03/2020 biopsies                            showed intestinal metaplasia Medicines:                Monitored Anesthesia Care Procedure:                Pre-Anesthesia Assessment:                           - Prior to the procedure, a History and Physical                            was performed, and patient medications and                            allergies were reviewed. The patient's tolerance of                            previous anesthesia was also reviewed. The risks                            and benefits of the procedure and the sedation                            options and risks were discussed with the patient.                            All questions were answered, and informed consent                            was obtained. Prior Anticoagulants: The patient has                            taken no previous anticoagulant or antiplatelet                            agents. ASA Grade Assessment: II - A patient with                            mild systemic disease. After reviewing the risks                            and benefits, the patient was deemed in  satisfactory condition to undergo the procedure.                           After obtaining informed consent, the endoscope was                            passed under direct vision. Throughout the                            procedure, the patient's blood pressure, pulse, and                            oxygen saturations were monitored continuously. The                            Endoscope was introduced through the  mouth, and                            advanced to the third part of duodenum. The upper                            GI endoscopy was accomplished without difficulty.                            The patient tolerated the procedure well. Scope In: Scope Out: Findings:                 The esophagus was normal.                           The entire examined stomach was normal except for                            an area of scarring in the antrum at the location                            of the prior ulcer. Biopsies were taken from the                            antrum, body, and fundus with a cold forceps for                            histology. Estimated blood loss was minimal.                           The examined duodenum was normal. Complications:            No immediate complications. Estimated blood loss:                            Minimal. Estimated Blood Loss:     Estimated blood loss was minimal. Impression:               - Normal esophagus.                           -  Evidence for healed gastric ulcer.                           - Multiple biopsies obtained given the history of                            gastric intestinal metaplasia.                           - Normal examined duodenum. Recommendation:           - Patient has a contact number available for                            emergencies. The signs and symptoms of potential                            delayed complications were discussed with the                            patient. Return to normal activities tomorrow.                            Written discharge instructions were provided to the                            patient.                           - Resume previous diet.                           - Continue present medications.                           - Avoid all NSAIDs.                           - Await pathology results. Thornton Park MD, MD 08/08/2020 11:23:18 AM This report has been signed  electronically.

## 2020-08-08 NOTE — Progress Notes (Signed)
VS NS ° °Pt's states no medical or surgical changes since previsit or office visit. ° °

## 2020-08-12 ENCOUNTER — Telehealth: Payer: Self-pay

## 2020-08-12 NOTE — Telephone Encounter (Signed)
  Follow up Call-  Call back number 08/08/2020 04/17/2020  Post procedure Call Back phone  # [phone]  Permission to leave phone message Yes Yes  Some recent data might be hidden     Patient questions:  Do you have a fever, pain , or abdominal swelling? No. Pain Score  0 *  Have you tolerated food without any problems? Yes.    Have you been able to return to your normal activities? Yes.    Do you have any questions about your discharge instructions: Diet   No. Medications  No. Follow up visit  No.  Do you have questions or concerns about your Care? No.  Actions: * If pain score is 4 or above: No action needed, pain <4.  1. Have you developed a fever since your procedure? no  2.   Have you had an respiratory symptoms (SOB or cough) since your procedure? no  3.   Have you tested positive for COVID 19 since your procedure no  4.   Have you had any family members/close contacts diagnosed with the COVID 19 since your procedure?  no   If yes to any of these questions please route to Joylene John, RN and Joella Prince, RN

## 2020-08-13 ENCOUNTER — Other Ambulatory Visit: Payer: Self-pay

## 2020-08-13 MED FILL — VALACYCLOVIR HCL 500 MG TAB: 500 | 90 days supply | Qty: 180 | Fill #1

## 2020-08-14 ENCOUNTER — Ambulatory Visit (INDEPENDENT_AMBULATORY_CARE_PROVIDER_SITE_OTHER): Payer: No Typology Code available for payment source | Admitting: Family Medicine

## 2020-08-14 ENCOUNTER — Encounter: Payer: Self-pay | Admitting: Family Medicine

## 2020-08-14 VITALS — BP 110/60 | HR 55 | Temp 97.7°F | Ht 67.0 in | Wt 126.8 lb

## 2020-08-14 DIAGNOSIS — Z13220 Encounter for screening for lipoid disorders: Secondary | ICD-10-CM | POA: Diagnosis not present

## 2020-08-14 DIAGNOSIS — Z0000 Encounter for general adult medical examination without abnormal findings: Secondary | ICD-10-CM

## 2020-08-14 DIAGNOSIS — Z1321 Encounter for screening for nutritional disorder: Secondary | ICD-10-CM | POA: Diagnosis not present

## 2020-08-14 DIAGNOSIS — Z2821 Immunization not carried out because of patient refusal: Secondary | ICD-10-CM

## 2020-08-14 DIAGNOSIS — Z5320 Procedure and treatment not carried out because of patient's decision for unspecified reasons: Secondary | ICD-10-CM

## 2020-08-14 DIAGNOSIS — Z1329 Encounter for screening for other suspected endocrine disorder: Secondary | ICD-10-CM

## 2020-08-14 LAB — CBC WITH DIFFERENTIAL/PLATELET
Basophils Absolute: 0.1 10*3/uL (ref 0.0–0.1)
Basophils Relative: 1 % (ref 0.0–3.0)
Eosinophils Absolute: 0.1 10*3/uL (ref 0.0–0.7)
Eosinophils Relative: 1.6 % (ref 0.0–5.0)
HCT: 41.3 % (ref 36.0–46.0)
Hemoglobin: 13.8 g/dL (ref 12.0–15.0)
Lymphocytes Relative: 25.9 % (ref 12.0–46.0)
Lymphs Abs: 1.5 10*3/uL (ref 0.7–4.0)
MCHC: 33.4 g/dL (ref 30.0–36.0)
MCV: 95.8 fl (ref 78.0–100.0)
Monocytes Absolute: 0.4 10*3/uL (ref 0.1–1.0)
Monocytes Relative: 6.8 % (ref 3.0–12.0)
Neutro Abs: 3.8 10*3/uL (ref 1.4–7.7)
Neutrophils Relative %: 64.7 % (ref 43.0–77.0)
Platelets: 198 10*3/uL (ref 150.0–400.0)
RBC: 4.31 Mil/uL (ref 3.87–5.11)
RDW: 13.2 % (ref 11.5–15.5)
WBC: 5.9 10*3/uL (ref 4.0–10.5)

## 2020-08-14 LAB — COMPREHENSIVE METABOLIC PANEL
ALT: 14 U/L (ref 0–35)
AST: 17 U/L (ref 0–37)
Albumin: 4.5 g/dL (ref 3.5–5.2)
Alkaline Phosphatase: 42 U/L (ref 39–117)
BUN: 10 mg/dL (ref 6–23)
CO2: 31 mEq/L (ref 19–32)
Calcium: 9.6 mg/dL (ref 8.4–10.5)
Chloride: 100 mEq/L (ref 96–112)
Creatinine, Ser: 0.68 mg/dL (ref 0.40–1.20)
GFR: 108.05 mL/min (ref >60.00)
Glucose, Bld: 85 mg/dL (ref 70–99)
Potassium: 4.2 mEq/L (ref 3.5–5.1)
Sodium: 136 mEq/L (ref 135–145)
Total Bilirubin: 0.5 mg/dL (ref 0.2–1.2)
Total Protein: 6.8 g/dL (ref 6.0–8.3)

## 2020-08-14 LAB — LIPID PANEL
Cholesterol: 178 mg/dL (ref 0–200)
HDL: 71 mg/dL (ref >39.00)
LDL Cholesterol: 80 mg/dL (ref 0–99)
NonHDL: 107.06
Total CHOL/HDL Ratio: 3
Triglycerides: 134 mg/dL (ref 0.0–149.0)
VLDL: 26.8 mg/dL (ref 0.0–40.0)

## 2020-08-14 LAB — MAGNESIUM: Magnesium: 2.1 mg/dL (ref 1.5–2.5)

## 2020-08-14 LAB — T4, FREE: Free T4: 0.79 ng/dL (ref 0.60–1.60)

## 2020-08-14 LAB — T3, FREE: T3, Free: 2.9 pg/mL (ref 2.3–4.2)

## 2020-08-14 LAB — TSH: TSH: 0.8 u[IU]/mL (ref 0.35–4.50)

## 2020-08-14 LAB — HEMOGLOBIN A1C: Hgb A1c MFr Bld: 5.2 % (ref 4.6–6.5)

## 2020-08-14 LAB — VITAMIN D 25 HYDROXY (VIT D DEFICIENCY, FRACTURES): VITD: 79.37 ng/mL (ref 30.00–100.00)

## 2020-08-14 LAB — CORTISOL: Cortisol, Plasma: 8.6 ug/dL

## 2020-08-14 LAB — HIGH SENSITIVITY CRP: CRP, High Sensitivity: <0.2 mg/L (ref 0.000–5.000)

## 2020-08-14 LAB — GAMMA GT: GGT: 10 U/L (ref 7–51)

## 2020-08-14 LAB — TESTOSTERONE: Testosterone: 39.68 ng/dL (ref 15.00–40.00)

## 2020-08-14 NOTE — Progress Notes (Signed)
Kristie [name] is a 40 y.o. female  Chief Complaint  Patient presents with  . Annual Exam    CPE/labs.  No concerns.  Declines flu and covid.     HPI: Kristie [name] is a 40 y.o. female seen today for annual CPE, fasting labs.  She previously followed with [name]. Continue to follow with Dr. Cristela Felt ([place]) in [place] and has a lengthy list of labs she would like done and then pt will send to Dr.Parks. She states insurance has covered in the past and if not she is willing to pay out of pocket.   Last PAP: 12/2019 - normal PAP, HPV negative Last mammo: declines  Last colonoscopy: 03/2020 - Dr. Tarri Glenn w/ [facility] - due in 03/2030 Pt also had EGD 07/2020 - evidence of healed gastric ulcer otherwise normal Dental: UTD  Vision: has appt scheduled  Med refills needed today? no  Imm: declines flu and covid vaccines   Past Medical History:  Diagnosis Date  . Anxiety   . Asthma, exercise induced   . Complication of anesthesia    Very sensitive to medicine  . Depression   . Dysplasia of cervix, high grade CIN 2   . HNP (herniated nucleus pulposus) with myelopathy, cervical   . HPV in female   . HSV-1 (herpes simplex virus 1) infection   . Migraines   . Seasonal allergies     Past Surgical History:  Procedure Laterality Date  . ANTERIOR CERVICAL DECOMP/DISCECTOMY FUSION N/A 02/01/2018   Procedure: Anterior cervical decompression/discectomy/fusion Cervical five-six Cervical six-seven;  Surgeon: Jovita Gamma, MD;  Location: [facility];  Service: Neurosurgery;  Laterality: N/A;  . COLONOSCOPY  2021  . LASER ABLATION OF THE CERVIX  2013   CIN-2  . UPPER GASTROINTESTINAL ENDOSCOPY  2021  . VAGINAL CYST EXCISED  2001    Social History   Socioeconomic History  . Marital status: Single    Spouse name: Not on file  . Number of children: 0  . Years of education: Not on file  . Highest education level: Not on file  Occupational History  . Occupation: Facilities manager: [employer]    Comment: [employer]  Tobacco Use  . Smoking status: Former Smoker    Types: Cigarettes    Quit date: 2005    Years since quitting: 17.1  . Smokeless tobacco: Never Used  Vaping Use  . Vaping Use: Never used  Substance and Sexual Activity  . Alcohol use: No  . Drug use: No  . Sexual activity: Not Currently  Other Topics Concern  . 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 Involved in [employer].       Prefers Naturopathic Medicine.    Social Determinants of Health   Financial Resource Strain: Not on file  Food Insecurity: Not on file  Transportation Needs: Not on file  Physical Activity: Not on file  Stress: Not on file  Social Connections: Not on file  Intimate Partner Violence: Not on file    Family History  Problem Relation Age of Onset  . Hypertension Mother   . Anxiety disorder Mother   . Breast cancer Maternal Aunt   . Prostate cancer Maternal Uncle   . Hypertension Maternal Grandmother   . Lung cancer Maternal Grandfather   . Colon cancer Neg Hx   . Colon polyps Neg Hx   . Esophageal cancer Neg Hx   .  Rectal cancer Neg Hx   . Stomach cancer Neg Hx       There is no immunization history on file for this patient.  Outpatient Encounter Medications as of 08/14/2020  Medication Sig  . albuterol (VENTOLIN HFA) 108 (90 Base) MCG/ACT inhaler Inhale 2 puffs into the lungs every 4 (four) hours as needed for wheezing or shortness of breath.  . B Complex Vitamins (VITAMIN B COMPLEX PO) Take 1 tablet by mouth daily.  . diazepam (VALIUM) 2 MG tablet Take 2 mg by mouth every 6 (six) hours as needed for anxiety.  . famotidine (PEPCID) 20 MG tablet Take 1 tablet (20 mg total) by mouth 2 (two) times daily.  Marland Kitchen MAGNESIUM OXIDE PO Take 5 mLs by mouth 2 (two) times daily.  . Melatonin 10 MG TABS Take 10 mg by mouth at bedtime as needed (sleep).  . valACYclovir (VALTREX) 500 MG tablet Take 1 tablet (500 mg  total) by mouth 2 (two) times daily.  Marland Kitchen VITAMIN D-VITAMIN K PO Take 1 tablet by mouth daily.   No facility-administered encounter medications on file as of 08/14/2020.     ROS: Gen: no fever, chills  Skin: no rash, itching ENT: no ear pain, ear drainage, nasal congestion, rhinorrhea, sinus pressure, sore throat Eyes: no blurry vision, double vision Resp: no cough, wheeze,SOB Breast: no breast tenderness, no nipple discharge, no breast masses CV: no CP, palpitations, LE edema,  GI: multiple GI symptoms - follows with GI GU: no dysuria, urgency, frequency, hematuria MSK: no joint pain, myalgias, back pain Neuro: no dizziness, headache, weakness Psych: no depression, anxiety, insomnia   Allergies  Allergen Reactions  . Acyclovir And Related Nausea Only and Other (See Comments)    Abdominal pain dizziness.  . Neurontin [Gabapentin] Other (See Comments)    Caused symptoms of depression  . Tindamax [Tinidazole]     UNSPECIFIED REACTION   . Zoloft [Sertraline Hcl]     paranoia    BP 110/60   Pulse (!) 55   Temp 97.7 F (36.5 C) (Temporal)   Ht 5\' 7"  (1.702 m)   Wt 126 lb 12.8 oz (57.5 kg)   LMP 08/04/2020 (LMP Unknown)   SpO2 99%   BMI 19.86 kg/m   Wt Readings from Last 3 Encounters:  08/14/20 126 lb 12.8 oz (57.5 kg)  08/08/20 130 lb (59 kg)  07/25/20 130 lb (59 kg)   Temp Readings from Last 3 Encounters:  08/14/20 97.7 F (36.5 C) (Temporal)  08/08/20 97.7 F (36.5 C) (Temporal)  04/17/20 (!) 97.5 F (36.4 C)   BP Readings from Last 3 Encounters:  08/14/20 110/60  08/08/20 102/66  04/17/20 103/67   Pulse Readings from Last 3 Encounters:  08/14/20 (!) 55  08/08/20 (!) 46  04/17/20 (!) 50     Physical Exam Constitutional:      General: She is not in acute distress.    Appearance: She is well-developed and well-nourished.  HENT:     Head: Normocephalic and atraumatic.     Right Ear: Tympanic membrane and ear canal normal.     Left Ear: Tympanic  membrane and ear canal normal.     Nose: Nose normal.     Mouth/Throat:     Mouth: Oropharynx is clear and moist and mucous membranes are normal. Mucous membranes are moist.     Pharynx: Oropharynx is clear.  Eyes:     Conjunctiva/sclera: Conjunctivae normal.  Neck:     Thyroid: No thyromegaly.  Cardiovascular:  Rate and Rhythm: Normal rate and regular rhythm.     Pulses: Intact distal pulses.     Heart sounds: Normal heart sounds. No murmur heard.   Pulmonary:     Effort: Pulmonary effort is normal. No respiratory distress.     Breath sounds: Normal breath sounds. No wheezing or rhonchi.  Abdominal:     General: Bowel sounds are normal. There is no distension.     Palpations: Abdomen is soft. There is no mass.     Tenderness: There is no abdominal tenderness.  Musculoskeletal:        General: No edema.     Cervical back: Neck supple.     Right lower leg: No edema.     Left lower leg: No edema.  Lymphadenopathy:     Cervical: No cervical adenopathy.  Skin:    General: Skin is warm and dry.  Neurological:     Mental Status: She is alert and oriented to person, place, and time.     Motor: No abnormal muscle tone.     Coordination: Coordination normal.  Psychiatric:        Mood and Affect: Mood and affect and mood normal.        Behavior: Behavior normal.      A/P:  1. Annual physical exam - discussed importance of regular CV exercise, healthy diet, adequate sleep - PAP and colonoscopy UTD - decline mammo today - declines flu vaccine - routine labs of CBC, CMP, lipid panel, Vit D ordered and will be resulted by me. Remained of labs below will be sent to and are the responsibility of Dr. Cristela Felt (natropathic practitoner)  - Comprehensive metabolic panel - T4, free - T3, free - T3, reverse - CBC w/Diff - ANA Screen,IFA,Reflex Titer/Pattern,Reflex Mplx 11 Ab Cascade with IdentRA - Cortisol - DHEA-sulfate - Estradiol - Estrogens, Total - Iron, TIBC and  Ferritin Panel - FSH/LH - Gamma GT - Growth hormone - Hemoglobin A1c - CRP High sensitivity - Leptin, Serum - Progesterone - Magnesium - Testosterone - T4 - Insulin, random  2. Screening for lipid disorders - Lipid panel  3. Encounter for vitamin deficiency screening - takes OTC Vit D supplement - VITAMIN D 25 Hydroxy (Vit-D Deficiency, Fractures)  4. Screening for thyroid disorder - TSH  5. Influenza vaccination declined by patient  6. COVID-19 vaccination declined  7. Mammogram declined    This visit occurred during the SARS-CoV-2 public health emergency.  Safety protocols were in place, including screening questions prior to the visit, additional usage of staff PPE, and extensive cleaning of exam room while observing appropriate contact time as indicated for disinfecting solutions.

## 2020-08-14 NOTE — Patient Instructions (Signed)
Health Maintenance, Female Adopting a healthy lifestyle and getting preventive care are important in promoting health and wellness. Ask your health care provider about:  The right schedule for you to have regular tests and exams.  Things you can do on your own to prevent diseases and keep yourself healthy. What should I know about diet, weight, and exercise? Eat a healthy diet  Eat a diet that includes plenty of vegetables, fruits, low-fat dairy products, and lean protein.  Do not eat a lot of foods that are high in solid fats, added sugars, or sodium.   Maintain a healthy weight Body mass index (BMI) is used to identify weight problems. It estimates body fat based on height and weight. Your health care provider can help determine your BMI and help you achieve or maintain a healthy weight. Get regular exercise Get regular exercise. This is one of the most important things you can do for your health. Most adults should:  Exercise for at least 150 minutes each week. The exercise should increase your heart rate and make you sweat (moderate-intensity exercise).  Do strengthening exercises at least twice a week. This is in addition to the moderate-intensity exercise.  Spend less time sitting. Even light physical activity can be beneficial. Watch cholesterol and blood lipids Have your blood tested for lipids and cholesterol at 42 years of age, then have this test every 5 years. Have your cholesterol levels checked more often if:  Your lipid or cholesterol levels are high.  You are older than 42 years of age.  You are at high risk for heart disease. What should I know about cancer screening? Depending on your health history and family history, you may need to have cancer screening at various ages. This may include screening for:  Breast cancer.  Cervical cancer.  Colorectal cancer.  Skin cancer.  Lung cancer. What should I know about heart disease, diabetes, and high blood  pressure? Blood pressure and heart disease  High blood pressure causes heart disease and increases the risk of stroke. This is more likely to develop in people who have high blood pressure readings, are of African descent, or are overweight.  Have your blood pressure checked: ? Every 3-5 years if you are 18-39 years of age. ? Every year if you are 40 years old or older. Diabetes Have regular diabetes screenings. This checks your fasting blood sugar level. Have the screening done:  Once every three years after age 40 if you are at a normal weight and have a low risk for diabetes.  More often and at a younger age if you are overweight or have a high risk for diabetes. What should I know about preventing infection? Hepatitis B If you have a higher risk for hepatitis B, you should be screened for this virus. Talk with your health care provider to find out if you are at risk for hepatitis B infection. Hepatitis C Testing is recommended for:  Everyone born from 1945 through 1965.  Anyone with known risk factors for hepatitis C. Sexually transmitted infections (STIs)  Get screened for STIs, including gonorrhea and chlamydia, if: ? You are sexually active and are younger than 42 years of age. ? You are older than 42 years of age and your health care provider tells you that you are at risk for this type of infection. ? Your sexual activity has changed since you were last screened, and you are at increased risk for chlamydia or gonorrhea. Ask your health care provider   if you are at risk.  Ask your health care provider about whether you are at high risk for HIV. Your health care provider may recommend a prescription medicine to help prevent HIV infection. If you choose to take medicine to prevent HIV, you should first get tested for HIV. You should then be tested every 3 months for as long as you are taking the medicine. Pregnancy  If you are about to stop having your period (premenopausal) and  you may become pregnant, seek counseling before you get pregnant.  Take 400 to 800 micrograms (mcg) of folic acid every day if you become pregnant.  Ask for birth control (contraception) if you want to prevent pregnancy. Osteoporosis and menopause Osteoporosis is a disease in which the bones lose minerals and strength with aging. This can result in bone fractures. If you are 65 years old or older, or if you are at risk for osteoporosis and fractures, ask your health care provider if you should:  Be screened for bone loss.  Take a calcium or vitamin D supplement to lower your risk of fractures.  Be given hormone replacement therapy (HRT) to treat symptoms of menopause. Follow these instructions at home: Lifestyle  Do not use any products that contain nicotine or tobacco, such as cigarettes, e-cigarettes, and chewing tobacco. If you need help quitting, ask your health care provider.  Do not use street drugs.  Do not share needles.  Ask your health care provider for help if you need support or information about quitting drugs. Alcohol use  Do not drink alcohol if: ? Your health care provider tells you not to drink. ? You are pregnant, may be pregnant, or are planning to become pregnant.  If you drink alcohol: ? Limit how much you use to 0-1 drink a day. ? Limit intake if you are breastfeeding.  Be aware of how much alcohol is in your drink. In the [place] one drink equals one 12 oz bottle of beer (355 mL), one 5 oz glass of wine (148 mL), or one 1 oz glass of hard liquor (44 mL). General instructions  Schedule regular health, dental, and eye exams.  Stay current with your vaccines.  Tell your health care provider if: ? You often feel depressed. ? You have ever been abused or do not feel safe at home. Summary  Adopting a healthy lifestyle and getting preventive care are important in promoting health and wellness.  Follow your health care provider's instructions about healthy  diet, exercising, and getting tested or screened for diseases.  Follow your health care provider's instructions on monitoring your cholesterol and blood pressure. This information is not intended to replace advice given to you by your health care provider. Make sure you discuss any questions you have with your health care provider. Document Revised: 06/07/2018 Document Reviewed: 06/07/2018 Elsevier Patient Education  2021 Elsevier Inc.  

## 2020-08-15 ENCOUNTER — Encounter: Payer: Self-pay | Admitting: Family Medicine

## 2020-08-19 ENCOUNTER — Encounter: Payer: Self-pay | Admitting: Gastroenterology

## 2020-08-19 LAB — ESTRADIOL: Estradiol: 463 pg/mL — ABNORMAL HIGH

## 2020-08-19 LAB — GROWTH HORMONE: Growth Hormone: 0.6 ng/mL (ref <=7.1)

## 2020-08-19 LAB — INSULIN, RANDOM: Insulin: 2.7 u[IU]/mL

## 2020-08-19 LAB — IRON,TIBC AND FERRITIN PANEL
%SAT: 37 % (calc) (ref 16–45)
Ferritin: 23 ng/mL (ref 16–232)
Iron: 104 ug/dL (ref 40–190)
TIBC: 278 mcg/dL (calc) (ref 250–450)

## 2020-08-19 LAB — FSH/LH
FSH: 22 m[IU]/mL
LH: 47.1 m[IU]/mL

## 2020-08-19 LAB — DHEA-SULFATE: DHEA-SO4: 207 ug/dL — ABNORMAL HIGH (ref 15–205)

## 2020-08-19 LAB — ESTROGENS, TOTAL: Estrogen: 739.3 pg/mL — ABNORMAL HIGH

## 2020-08-19 LAB — T3, REVERSE: T3, Reverse: 14 ng/dL (ref 8–25)

## 2020-08-19 LAB — T4: T4, Total: 6.7 ug/dL (ref 5.1–11.9)

## 2020-08-19 LAB — PROGESTERONE: Progesterone: 0.8 ng/mL

## 2020-08-19 LAB — LEPTIN, SERUM: Leptin, Serum: 7.4 ng/mL

## 2020-08-20 LAB — ANA SCREEN,IFA,REFLEX TITER/PATTERN,REFLEX MPLX 11 AB CASCADE
14-3-3 eta Protein: 0.2 ng/mL — ABNORMAL HIGH (ref <0.2)
Anti Nuclear Antibody (ANA): NEGATIVE
Cyclic Citrullin Peptide Ab: <16 UNITS
Rheumatoid fact SerPl-aCnc: <14 IU/mL (ref <14)

## 2020-08-29 ENCOUNTER — Other Ambulatory Visit (HOSPITAL_COMMUNITY): Payer: Self-pay | Admitting: Pharmacist

## 2020-08-29 MED FILL — CARESTART COVID-19 HOME TES: 4 days supply | Qty: 4 | Fill #0

## 2020-09-19 ENCOUNTER — Other Ambulatory Visit (HOSPITAL_BASED_OUTPATIENT_CLINIC_OR_DEPARTMENT_OTHER): Payer: Self-pay

## 2020-09-25 ENCOUNTER — Other Ambulatory Visit (HOSPITAL_COMMUNITY): Payer: Self-pay | Admitting: Student

## 2020-09-25 MED FILL — METHYLPREDNISOLONE 4 MG TBP: 4 | 6 days supply | Qty: 21 | Fill #0

## 2020-09-25 MED FILL — diazePAM 5 MG TABS: 5 | 20 days supply | Qty: 20 | Fill #0

## 2020-09-25 MED FILL — PREGABALIN 25 MG CAPS: 25 | 15 days supply | Qty: 45 | Fill #0

## 2020-09-25 MED FILL — TIZANIDINE HCL 4 MG CAPS: 4 | 15 days supply | Qty: 45 | Fill #0

## 2020-12-01 ENCOUNTER — Other Ambulatory Visit (HOSPITAL_COMMUNITY): Payer: Self-pay

## 2020-12-01 NOTE — Telephone Encounter (Signed)
Kristie [name], pls contact the patient. If she is taking Famotidine 20mg  po bid I would but feels jittery on this dose then she can reduce to Famotidine OTC 10mg  tab one po bid. She can take Gaviscon 1 tablespoon tid as needed. Patient should provide further update in one week, call office if symptoms worsen.  THX

## 2020-12-03 ENCOUNTER — Other Ambulatory Visit (HOSPITAL_COMMUNITY): Payer: Self-pay

## 2020-12-03 ENCOUNTER — Ambulatory Visit (INDEPENDENT_AMBULATORY_CARE_PROVIDER_SITE_OTHER): Payer: No Typology Code available for payment source | Admitting: Nurse Practitioner

## 2020-12-03 ENCOUNTER — Other Ambulatory Visit: Payer: Self-pay

## 2020-12-03 ENCOUNTER — Other Ambulatory Visit: Payer: No Typology Code available for payment source

## 2020-12-03 ENCOUNTER — Encounter: Payer: Self-pay | Admitting: Nurse Practitioner

## 2020-12-03 VITALS — BP 110/64 | HR 64 | Temp 97.6°F | Ht 66.0 in | Wt 126.6 lb

## 2020-12-03 DIAGNOSIS — R1013 Epigastric pain: Secondary | ICD-10-CM

## 2020-12-03 MED ORDER — SUCRALFATE 1 G PO TABS
1.0000 g | ORAL_TABLET | Freq: Three times a day (TID) | ORAL | 0 refills | Status: DC
  Filled 2020-12-03: qty 90, 23d supply, fill #0

## 2020-12-03 NOTE — Patient Instructions (Addendum)
Start carafate,  Schedule f/up with GI. Go to lab for stool kit  Gastritis, Adult Gastritis is inflammation of the stomach. There are two kinds of gastritis:  Acute gastritis. This kind develops suddenly.  Chronic gastritis. This kind is much more common and lasts for a long time. Gastritis happens when the lining of the stomach becomes weak or gets damaged. Without treatment, gastritis can lead to stomach bleeding and ulcers. What are the causes? This condition may be caused by:  An infection.  Drinking too much alcohol.  Certain medicines. These include steroids, antibiotics, and some over-the-counter medicines, such as aspirin or ibuprofen.  Having too much acid in the stomach.  A disease of the intestines or stomach.  Stress.  An allergic reaction.  Crohn's disease.  Some cancer treatments (radiation). Sometimes the cause of this condition is not known. What are the signs or symptoms? Symptoms of this condition include:  Pain or a burning sensation in the upper abdomen.  Nausea.  Vomiting.  An uncomfortable feeling of fullness after eating.  Weight loss.  Bad breath.  Blood in your vomit or stools. In some cases, there are no symptoms. How is this diagnosed? This condition may be diagnosed with:  Your medical history and a description of your symptoms.  A physical exam.  Tests. These can include: ? Blood tests. ? Stool tests. ? A test in which a thin, flexible instrument with a light and a camera is passed down the esophagus and into the stomach (upper endoscopy). ? A test in which a sample of tissue is taken for testing (biopsy). How is this treated? This condition may be treated with medicines. The medicines that are used vary depending on the cause of the gastritis:  If the condition is caused by a bacterial infection, you may be given antibiotic medicines.  If the condition is caused by too much acid in the stomach, you may be given medicines  called H2 blockers, proton pump inhibitors, or antacids. Treatment may also involve stopping the use of certain medicines, such as aspirin, ibuprofen, or other NSAIDs. Follow these instructions at home: Medicines  Take over-the-counter and prescription medicines only as told by your health care provider.  If you were prescribed an antibiotic medicine, take it as told by your health care provider. Do not stop taking the antibiotic even if you start to feel better. Eating and drinking  Eat small, frequent meals instead of large meals.  Avoid foods and drinks that make your symptoms worse.  Drink enough fluid to keep your urine pale yellow.   Alcohol use  Do not drink alcohol if: ? Your health care provider tells you not to drink. ? You are pregnant, may be pregnant, or are planning to become pregnant.  If you drink alcohol: ? Limit your use to:  0-1 drink a day for women.  0-2 drinks a day for men. ? Be aware of how much alcohol is in your drink. In the [place] one drink equals one 12 oz bottle of beer (355 mL), one 5 oz glass of wine (148 mL), or one 1 oz glass of hard liquor (44 mL). General instructions  Talk with your health care provider about ways to manage stress, such as getting regular exercise or practicing deep breathing, meditation, or yoga.  Do not use any products that contain nicotine or tobacco, such as cigarettes and e-cigarettes. If you need help quitting, ask your health care provider.  Keep all follow-up visits as told by  your health care provider. This is important. Contact a health care provider if:  Your symptoms get worse.  Your symptoms return after treatment. Get help right away if:  You vomit blood or material that looks like coffee grounds.  You have black or dark red stools.  You are unable to keep fluids down.  Your abdominal pain gets worse.  You have a fever.  You do not feel better after one week. Summary  Gastritis is inflammation  of the lining of the stomach that can occur suddenly (acute) or develop slowly over time (chronic).  This condition is diagnosed with a medical history, a physical exam, or tests.  This condition may be treated with medicines to treat infection or medicines to reduce the amount of acid in your stomach.  Follow your health care provider's instructions about taking medicines, making changes to your diet, and knowing when to call for help. This information is not intended to replace advice given to you by your health care provider. Make sure you discuss any questions you have with your health care provider. Document Revised: 11/01/2017 Document Reviewed: 11/01/2017 Elsevier Patient Education  [employer].

## 2020-12-03 NOTE — Progress Notes (Signed)
Subjective:  Patient ID: Kristie [name], female    DOB: [birth year]  Age: 40 y.o. MRN: [id number]  CC: Acute Visit (Pt c/o stomach issues x 3 weeks. Pt states she has been experiencing bloating, indigestion, discomfort, and feeling full)  GI Problem The primary symptoms include abdominal pain and nausea. Primary symptoms do not include fever, weight loss, fatigue, vomiting, diarrhea, melena, hematemesis, jaundice, hematochezia, dysuria, myalgias, arthralgias or rash. Primary symptoms comment: heartburn. The illness began more than 7 days ago (3weeks ago). The onset was gradual. The problem has been gradually worsening.  The illness is also significant for anorexia and bloating. The illness does not include chills, dysphagia, odynophagia, constipation, tenesmus, back pain or itching. Significant associated medical issues include GERD and PUD. Associated medical issues do not include irritable bowel syndrome or diverticulitis.  onset after bagpacking trip x 3nights. Reports she uses portable filter for drinking water. she had similar symptoms 03/2020. eval by pcp and GI, stool studies and endoscopy completed (04/30/20-gastric ulser, negative H.pylori and repeat 08/08/2020- Normal esophagus. Evidence for healed gastric ulcer. Normal examined duodenum. Negative H. pylori Unable to tolerate PPI, no improvement with famotidine.  Reviewed past Medical, Social and Family history today.  Outpatient Medications Prior to Visit  Medication Sig Dispense Refill  . albuterol (VENTOLIN HFA) 108 (90 Base) MCG/ACT inhaler Inhale 2 puffs into the lungs every 4 (four) hours as needed for wheezing or shortness of breath. 18 g 2  . B Complex Vitamins (VITAMIN B COMPLEX PO) Take 1 tablet by mouth daily.    . diazepam (VALIUM) 2 MG tablet Take 2 mg by mouth every 6 (six) hours as needed for anxiety.    . diazepam (VALIUM) 5 MG tablet TAKE 1 TABLET BY MOUTH ONCE A DAY AS NEEDED 20 tablet 0  . famotidine (PEPCID) 20 MG  tablet TAKE 1 TABLET (20 MG TOTAL) BY MOUTH 2 (TWO) TIMES DAILY. 60 tablet 3  . MAGNESIUM OXIDE PO Take 5 mLs by mouth 2 (two) times daily.    Marland Kitchen tiZANidine (ZANAFLEX) 4 MG capsule TAKE 1 CAPSULE BY MOUTH 3 TIMES DAILY 45 capsule 0  . valACYclovir (VALTREX) 500 MG tablet TAKE 1 TABLET BY MOUTH TWO TIMES DAILY 180 tablet 3  . VITAMIN D-VITAMIN K PO Take 1 tablet by mouth daily.    . clindamycin (CLEOCIN T) 1 % lotion APPLY TO AFFECTED AREA(S) TWICE DAILY AS NEEDED FOR FLARES 60 mL 2  . COVID-19 At Home Antigen Test KIT USE AS DIRECTED 4 kit 0  . Melatonin 10 MG TABS Take 10 mg by mouth at bedtime as needed (sleep).    . methylPREDNISolone (MEDROL DOSEPAK) 4 MG TBPK tablet TAKE BY MOUTH AS DIRECTED PER PACKAGE INSTRUCTIONS 21 each 0  . pregabalin (LYRICA) 25 MG capsule TAKE 1 CAPSULE BY MOUTH 3 TIMES DAILY 45 capsule 0   No facility-administered medications prior to visit.    ROS See HPI  Objective:  BP 110/64 (BP Location: Left Arm, Patient Position: Sitting, Cuff Size: Normal)   Pulse 64   Temp 97.6 F (36.4 C) (Temporal)   Ht _0  (1.676 m)   Wt 126 lb 9.6 oz (57.4 kg)   SpO2 99%   BMI 20.43 kg/m   Physical Exam Vitals reviewed.  Pulmonary:     Effort: Pulmonary effort is normal.  Abdominal:     General: There is no distension.     Palpations: There is no mass.     Tenderness: There is abdominal  tenderness. There is no guarding.     Hernia: No hernia is present.     Comments: Epigastric tenderness  Neurological:     Mental Status: She is alert and oriented to person, place, and time.    Assessment & Plan:  This visit occurred during the SARS-CoV-2 public health emergency.  Safety protocols were in place, including screening questions prior to the visit, additional usage of staff PPE, and extensive cleaning of exam room while observing appropriate contact time as indicated for disinfecting solutions.   [name] was seen today for acute visit.  Diagnoses and all orders for  this visit:  Dyspepsia -     sucralfate (CARAFATE) 1 g tablet; Take 1 tablet (1 g total) by mouth 4 (four) times daily -  with meals and at bedtime. -     GI Profile, Stool, PCR; Future -     Ova and parasite examination; Future  Other orders -     Cancel: Gastrointestinal Pathogen Panel PCR; Future   Problem List Items Addressed This Visit   None   Visit Diagnoses    Dyspepsia    -  Primary   Relevant Medications   sucralfate (CARAFATE) 1 g tablet   Other Relevant Orders   GI Profile, Stool, PCR   Ova and parasite examination      Follow-up: Return if symptoms worsen or fail to improve.  Wilfred Lacy, NP

## 2020-12-04 ENCOUNTER — Ambulatory Visit: Payer: No Typology Code available for payment source | Admitting: Physician Assistant

## 2020-12-04 ENCOUNTER — Other Ambulatory Visit: Payer: No Typology Code available for payment source

## 2020-12-08 LAB — GI PROFILE, STOOL, PCR

## 2020-12-09 ENCOUNTER — Encounter: Payer: Self-pay | Admitting: Family Medicine

## 2020-12-10 ENCOUNTER — Other Ambulatory Visit (HOSPITAL_COMMUNITY): Payer: Self-pay

## 2020-12-10 LAB — SPECIMEN STATUS REPORT

## 2020-12-10 MED ORDER — FLUCONAZOLE 100 MG PO TABS
100.0000 mg | ORAL_TABLET | Freq: Every day | ORAL | 0 refills | Status: AC
  Filled 2020-12-10: qty 9, 9d supply, fill #0

## 2020-12-11 ENCOUNTER — Ambulatory Visit: Payer: No Typology Code available for payment source | Admitting: Family Medicine

## 2020-12-11 LAB — OVA AND PARASITE EXAMINATION

## 2020-12-13 ENCOUNTER — Other Ambulatory Visit (HOSPITAL_COMMUNITY): Payer: Self-pay

## 2020-12-13 MED FILL — Valacyclovir HCl Tab 500 MG: ORAL | 30 days supply | Qty: 90 | Fill #0 | Status: AC

## 2020-12-16 ENCOUNTER — Other Ambulatory Visit (HOSPITAL_COMMUNITY): Payer: Self-pay

## 2020-12-30 ENCOUNTER — Encounter: Payer: Self-pay | Admitting: Family Medicine

## 2021-01-05 ENCOUNTER — Other Ambulatory Visit: Payer: Self-pay

## 2021-01-05 DIAGNOSIS — R109 Unspecified abdominal pain: Secondary | ICD-10-CM

## 2021-01-05 DIAGNOSIS — R197 Diarrhea, unspecified: Secondary | ICD-10-CM

## 2021-01-07 ENCOUNTER — Other Ambulatory Visit: Payer: No Typology Code available for payment source

## 2021-01-07 DIAGNOSIS — R197 Diarrhea, unspecified: Secondary | ICD-10-CM

## 2021-01-12 ENCOUNTER — Encounter: Payer: Self-pay | Admitting: Nurse Practitioner

## 2021-01-12 ENCOUNTER — Other Ambulatory Visit (HOSPITAL_COMMUNITY)
Admission: RE | Admit: 2021-01-12 | Discharge: 2021-01-12 | Disposition: A | Discharge status: Home / Self Care | Payer: No Typology Code available for payment source | Source: Ambulatory Visit | Attending: Nurse Practitioner | Admitting: Nurse Practitioner

## 2021-01-12 ENCOUNTER — Ambulatory Visit (INDEPENDENT_AMBULATORY_CARE_PROVIDER_SITE_OTHER): Payer: No Typology Code available for payment source | Admitting: Nurse Practitioner

## 2021-01-12 ENCOUNTER — Other Ambulatory Visit: Payer: Self-pay

## 2021-01-12 ENCOUNTER — Other Ambulatory Visit (HOSPITAL_COMMUNITY): Payer: Self-pay

## 2021-01-12 VITALS — BP 116/74 | Ht 66.0 in | Wt 127.0 lb

## 2021-01-12 DIAGNOSIS — Z87410 Personal history of cervical dysplasia: Secondary | ICD-10-CM | POA: Diagnosis not present

## 2021-01-12 DIAGNOSIS — B009 Herpesviral infection, unspecified: Secondary | ICD-10-CM | POA: Diagnosis not present

## 2021-01-12 DIAGNOSIS — Z01419 Encounter for gynecological examination (general) (routine) without abnormal findings: Secondary | ICD-10-CM | POA: Diagnosis present

## 2021-01-12 DIAGNOSIS — Z9889 Other specified postprocedural states: Secondary | ICD-10-CM | POA: Diagnosis not present

## 2021-01-12 LAB — OVA AND PARASITE EXAMINATION
CONCENTRATE RESULT:: NONE SEEN
MICRO NUMBER:: 12114444
SPECIMEN QUALITY:: ADEQUATE
TRICHROME RESULT:: NONE SEEN

## 2021-01-12 MED ORDER — VALACYCLOVIR HCL 500 MG PO TABS
ORAL_TABLET | Freq: Two times a day (BID) | ORAL | 3 refills | Status: DC
  Filled 2021-01-12 – 2021-01-19 (×2): qty 180, 90d supply, fill #0
  Filled 2021-04-24: qty 180, 90d supply, fill #1
  Filled 2021-07-14: qty 180, 90d supply, fill #2
  Filled 2021-10-17: qty 180, 90d supply, fill #3

## 2021-01-12 NOTE — Progress Notes (Signed)
Kristie [name] April 14, 1981 [id number]   History:  40 y.o. G0 presents for annual exam without GYN complaints. Monthly cycles. 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 Sexually active with one partner, had negative STD screening at health department. Seeing [name] - recently told her had high levels of metals. Takes Valtrex twice daily for HSV, was prescribed by PCP who is leaving practice and asks for a refill. Recent endoscopy showed healing ulcer but she has been unable to stop Pepcid due to returning symptoms.   Gynecologic History Patient's last menstrual period was 01/04/2021. Period Cycle (Days): 28 Period Duration (Days): 3 Period Pattern: Regular Menstrual Flow: Light Dysmenorrhea: (!) Moderate Dysmenorrhea Symptoms: Cramping Contraception: abstinence  Health Maintenance Last Pap: 01/08/2020 Results were: normal Last mammogram: Never Last colonoscopy: 2022. Results were: Normal Last Dexa: Not indicated  Past medical history, past surgical history, family history and social history were all reviewed and documented in the EPIC chart. 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   ROS:  A ROS was performed and pertinent positives and negatives are included.  Exam:  Vitals:   01/12/21 1549  BP: 116/74  Weight: 127 lb (57.6 kg)  Height: 5\' 6"  (1.676 m)    Body mass index is 20.5 kg/m.  General appearance:  Normal Thyroid:  Symmetrical, normal in size, without palpable masses or nodularity. Respiratory  Auscultation:  Clear without wheezing or rhonchi Cardiovascular  Auscultation:  Regular rate, without rubs, murmurs or gallops  Edema/varicosities:  Not grossly evident Abdominal  Soft,nontender, without masses, guarding or rebound.  Liver/spleen:  No organomegaly noted  Hernia:  None appreciated  Skin  Inspection:  Grossly normal   Breasts: Examined lying  and sitting.   Right: Without masses, retractions, discharge or axillary adenopathy.   Left: Without masses, retractions, discharge or axillary adenopathy. Gentitourinary   Inguinal/mons:  Normal without inguinal adenopathy  External genitalia:  Normal  BUS/Urethra/Skene's glands:  Normal  Vagina:  Normal  Cervix:  Reparative changes  Uterus:  Anteverted, normal in size, shape and contour.  Midline and mobile  Adnexa/parametria:     Rt: Without masses or tenderness.   Lt: Without masses or tenderness.  Anus and perineum: Normal  Assessment/Plan:  40 y.o. G0 for annual exam.  Well female exam with routine gynecological exam - Plan: Cytology - PAP( [place]). Education provided on SBEs, importance of preventative screenings, current guidelines, high calcium diet, regular exercise, and multivitamin daily. Labs with [facility] 1 month ago.   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 akes Valtrex twice daily for HSV, was prescribed by PCP who is leaving practice and asks for a refill. No outbreaks. Reports trying to stop Valtrex but she feels bad when she does. She plans to stop taking after workup with [facility].   Screening for cervical cancer - 2013 CO2 laser ablation for CIN-2, 2018 LEEP CIN-1, 2019 pap ASCUS negative HPV, 2020/2021 normal cytology negative HPV. Pap with reflex today. If normal we discussed screenings every 3 years per guidelines.   Screening for breast cancer - Has not had screening mammogram.  Discussed current guidelines and importance of preventative screenings.  Information provided on local breast imaging centers.  Normal breast exam today.  Screening for colon cancer - 2022  colonoscopy. Will repeat at GI's recommended interval.  Follow up in 1 year for annual     [place], 4:24 PM  01/12/2021

## 2021-01-13 LAB — CYTOLOGY - PAP: Diagnosis: NEGATIVE

## 2021-01-19 ENCOUNTER — Other Ambulatory Visit (HOSPITAL_COMMUNITY): Payer: Self-pay

## 2021-01-25 ENCOUNTER — Encounter: Payer: Self-pay | Admitting: Family Medicine

## 2021-01-26 ENCOUNTER — Other Ambulatory Visit (HOSPITAL_COMMUNITY): Payer: Self-pay

## 2021-01-26 DIAGNOSIS — B829 Intestinal parasitism, unspecified: Secondary | ICD-10-CM

## 2021-01-26 HISTORY — DX: Intestinal parasitism, unspecified: B82.9

## 2021-01-26 NOTE — Telephone Encounter (Signed)
Some people continue to require treatment for acid peptic problems over time. I saw her earlier message about having multiple headaches. I would recommend that she follow-up with her primary care provider to evaluate for a unifying cause of her symptoms. To further her evaluation for GI symptoms, please schedule office visit with me or Colleen. If she would like, could pursue SIBO breath testing prior to that visit. Otherwise, can discuss plans at the time of her visit. Thanks.

## 2021-01-30 ENCOUNTER — Other Ambulatory Visit (HOSPITAL_COMMUNITY): Payer: Self-pay

## 2021-01-30 ENCOUNTER — Other Ambulatory Visit: Payer: Self-pay

## 2021-01-30 ENCOUNTER — Ambulatory Visit (INDEPENDENT_AMBULATORY_CARE_PROVIDER_SITE_OTHER): Payer: No Typology Code available for payment source | Admitting: Family Medicine

## 2021-01-30 ENCOUNTER — Encounter: Payer: Self-pay | Admitting: Family Medicine

## 2021-01-30 VITALS — BP 110/70 | HR 55 | Temp 98.5°F | Ht 66.0 in | Wt 123.0 lb

## 2021-01-30 DIAGNOSIS — B9689 Other specified bacterial agents as the cause of diseases classified elsewhere: Secondary | ICD-10-CM

## 2021-01-30 DIAGNOSIS — Z7729 Contact with and (suspected ) exposure to other hazardous substances: Secondary | ICD-10-CM | POA: Diagnosis not present

## 2021-01-30 DIAGNOSIS — J329 Chronic sinusitis, unspecified: Secondary | ICD-10-CM | POA: Diagnosis not present

## 2021-01-30 DIAGNOSIS — R519 Headache, unspecified: Secondary | ICD-10-CM | POA: Diagnosis not present

## 2021-01-30 MED ORDER — PREDNISONE 20 MG PO TABS
ORAL_TABLET | ORAL | 0 refills | Status: DC
  Filled 2021-01-30: qty 18, 10d supply, fill #0

## 2021-01-30 MED ORDER — DOXYCYCLINE HYCLATE 100 MG PO TABS
100.0000 mg | ORAL_TABLET | Freq: Two times a day (BID) | ORAL | 0 refills | Status: DC
  Filled 2021-01-30: qty 20, 10d supply, fill #0

## 2021-01-30 NOTE — Patient Instructions (Signed)

## 2021-01-30 NOTE — Progress Notes (Signed)
This visit occurred during the SARS-CoV-2 public health emergency.  Safety protocols were in place, including screening questions prior to the visit, additional usage of staff PPE, and extensive cleaning of exam room while observing appropriate contact time as indicated for disinfecting solutions.    TAQUILA VANLENTEN , [birth year], 40 y.o., female MRN: [id number] Patient Care Team    Relationship Specialty Notifications Start End  Ronnald Nian, DO PCP - General Family Medicine  04/18/19   [name], PT Physical Therapist Physical Therapy  04/06/18   [facility]    02/05/21   Thornton Park, MD Consulting Physician Gastroenterology  02/05/21     Chief Complaint  Patient presents with   Sinusitis    Pt c/o  dizzy (light headed) sinus pressure, ear pain; recently experienced gas exposure with carbon monoxide 3.5 weeks ago; HVAC system was recently replaced due to mold, dust, and rust       Subjective: Pt presents for an OV with complaints of dizziness, sinus pressure with headache and ear discomfort for a little over a week.  She has been having difficulty with her HVAC system and feels she was exposed to carbon monoxide about 3 and half weeks ago.  The HVAC system had been replaced secondary to mold, dust and rest within the system.  She states her headache started after her gas was shut off and then restarted with new system.  She denies fevers, chills, nausea, vomit or diarrhea. She is having some epigastric/abdominal discomfort and working with her GI team for resolution. She also is established with [name].  Depression screen [facility] 2/9 08/14/2020 07/07/2018 02/10/2016  Decreased Interest 0 1 0  Down, Depressed, Hopeless 0 1 0  PHQ - 2 Score 0 2 0  Altered sleeping - 1 -  Tired, decreased energy - 2 -  Change in appetite - 0 -  Feeling bad or failure about yourself  - 0 -  Trouble concentrating - 0 -  Moving slowly or  fidgety/restless - 0 -  Suicidal thoughts - 0 -  PHQ-9 Score - 5 -  Difficult doing work/chores - Not difficult at all -    Allergies  Allergen Reactions   Acyclovir And Related Nausea Only and Other (See Comments)    Abdominal pain dizziness.   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 Involved in [employer].       Prefers Naturopathic Medicine.    Past Medical History:  Diagnosis Date   Anxiety    Asthma, exercise induced    Carbon monoxide exposure    Complication of anesthesia    Very sensitive to medicine   Depression    Dysplasia of cervix, high grade CIN 2    HNP (herniated nucleus pulposus) with myelopathy, cervical    HPV in female    HSV-1 (herpes simplex virus 1) infection    Migraines    Peptic ulcer    Seasonal allergies    Past Surgical History:  Procedure Laterality Date   ANTERIOR CERVICAL DECOMP/DISCECTOMY FUSION N/A 02/01/2018   Procedure: Anterior cervical decompression/discectomy/fusion Cervical five-six Cervical six-seven;  Surgeon: Jovita Gamma,  MD;  Location: [facility];  Service: Neurosurgery;  Laterality: N/A;   COLONOSCOPY  2021   LASER ABLATION OF THE CERVIX  2013   CIN-2   UPPER GASTROINTESTINAL ENDOSCOPY  2021   VAGINAL CYST EXCISED  2001   Family History  Problem Relation Age of Onset   Hypertension Mother    Anxiety disorder Mother    Breast cancer Maternal Aunt    Prostate cancer Maternal Uncle    Hypertension Maternal Grandmother    Lung cancer Maternal Grandfather    Colon cancer Neg Hx    Colon polyps Neg Hx    Esophageal cancer Neg Hx    Rectal cancer Neg Hx    Stomach cancer Neg Hx    Allergies as of 01/30/2021       Reactions    Acyclovir And Related Nausea Only, Other (See Comments)   Abdominal pain dizziness.   Protonix [pantoprazole] Anxiety   Neurontin [gabapentin] Other (See Comments)   Caused symptoms of depression   Omeprazole Other (See Comments)   headaches   Tindamax [tinidazole]    UNSPECIFIED REACTION    Zoloft [sertraline Hcl]    paranoia        Medication List        Accurate as of January 30, 2021 11:59 PM. If you have any questions, ask your nurse or doctor.          albuterol 108 (90 Base) MCG/ACT inhaler Commonly known as: Ventolin HFA Inhale 2 puffs into the lungs every 4 (four) hours as needed for wheezing or shortness of breath.   diazepam 5 MG tablet Commonly known as: VALIUM TAKE 1 TABLET BY MOUTH ONCE A DAY AS NEEDED   doxycycline 100 MG tablet Commonly known as: VIBRA-TABS Take 1 tablet (100 mg total) by mouth 2 (two) times daily. Started by: Howard Pouch, DO   famotidine 20 MG tablet Commonly known as: PEPCID TAKE 1 TABLET (20 MG TOTAL) BY MOUTH 2 (TWO) TIMES DAILY.   MAGNESIUM OXIDE PO Take 5 mLs by mouth 2 (two) times daily.   predniSONE 20 MG tablet Commonly known as: DELTASONE Take 3 tablets by mouth daily for 3 days, then 2 tabs daily for 3 days, then 1 tab daily for 2 days, then 1/2 tab daily for 2 days. Started by: Howard Pouch, DO   sucralfate 1 g tablet Commonly known as: CARAFATE Take 1 tablet (1 g total) by mouth 4 (four) times daily -  with meals and at bedtime.   tiZANidine 4 MG capsule Commonly known as: ZANAFLEX TAKE 1 CAPSULE BY MOUTH 3 TIMES DAILY   valACYclovir 500 MG tablet Commonly known as: VALTREX TAKE 1 TABLET BY MOUTH 2 TIMES DAILY   VITAMIN B COMPLEX PO Take 1 tablet by mouth daily.   VITAMIN D-VITAMIN K PO Take 1 tablet by mouth daily.        All past medical history, surgical history, allergies, family history, immunizations andmedications were updated in the EMR today and reviewed under the history and medication  portions of their EMR.     ROS: Negative, with the exception of above mentioned in HPI   Objective:  BP 110/70   Pulse (!) 55   Temp 98.5 F (36.9 C) (Oral)   Ht '5\' 6"'$  (1.676 m)   Wt 123 lb (55.8 kg)   LMP 01/04/2021   SpO2 100%   BMI 19.85 kg/m  Body mass index is 19.85 kg/m. Gen: Afebrile. No acute distress. Nontoxic in appearance,  well developed, well nourished.  HENT: AT. [place]. Bilateral TM visualized without erythema or bulging EAC normal bilaterally.MMM, no oral lesions. Bilateral nares with erythema, drainage-no swelling. Throat without erythema or exudates.  Cough.  No hoarseness.  Tender palpation over sinus cavity Eyes:Pupils Equal Round Reactive to light, Extraocular movements intact,  Conjunctiva without redness, discharge or icterus. Neck/lymp/endocrine: Supple, no lymphadenopathy CV: RRR  Chest: CTAB, no wheeze or crackles. Good air movement, normal resp effort.  Skin: no rashes, purpura or petechiae.  Neuro: Normal gait. PERLA. EOMi. Alert. Oriented x3  Psych: Normal affect, dress and demeanor. Normal speech. Normal thought content and judgment.  No results found. No results found. No results found for this or any previous visit (from the past 24 hour(s)).  Assessment/Plan: BETTE SWATZELL is a 40 y.o. female present for OV for  Acute nonintractable headache, unspecified headache type/sinusitis Rest, hydrate.  +/- flonase, nettie pot or nasal saline.  Doxycycline prednisone prescribed, take until completed.  - CBC w/Diff> normal Suspect new HVAC may distributed dust etc. from events when restarted.  Which caused her to have sinusitis and headache. Follow-up in 2 weeks if not seeing improvement in symptoms.  Carbon monoxide exposure - Carboxyhemoglobin ordered today> normal  Reviewed expectations re: course of current medical issues. Discussed self-management of symptoms. Outlined signs and symptoms indicating need for more acute intervention. Patient  verbalized understanding and all questions were answered. Patient received an After-Visit Summary.    Orders Placed This Encounter  Procedures   Carboxyhemoglobin   CBC w/Diff   Meds ordered this encounter  Medications   doxycycline (VIBRA-TABS) 100 MG tablet    Sig: Take 1 tablet (100 mg total) by mouth 2 (two) times daily.    Dispense:  20 tablet    Refill:  0   predniSONE (DELTASONE) 20 MG tablet    Sig: Take 3 tablets by mouth daily for 3 days, then 2 tabs daily for 3 days, then 1 tab daily for 2 days, then 1/2 tab daily for 2 days.    Dispense:  18 tablet    Refill:  0   Referral Orders  No referral(s) requested today     Note is dictated utilizing voice recognition software. Although note has been proof read prior to signing, occasional typographical errors still can be missed. If any questions arise, please do not hesitate to call for verification.   electronically signed by:  Howard Pouch, DO  [place]

## 2021-01-31 LAB — CBC WITH DIFFERENTIAL/PLATELET
Absolute Monocytes: 413 cells/uL (ref 200–950)
Basophils Absolute: 47 cells/uL (ref 0–200)
Basophils Relative: 0.8 %
Eosinophils Absolute: 242 cells/uL (ref 15–500)
Eosinophils Relative: 4.1 %
HCT: 39.5 % (ref 35.0–45.0)
Hemoglobin: 12.9 g/dL (ref 11.7–15.5)
Lymphs Abs: 2154 cells/uL (ref 850–3900)
MCH: 32.1 pg (ref 27.0–33.0)
MCHC: 32.7 g/dL (ref 32.0–36.0)
MCV: 98.3 fL (ref 80.0–100.0)
MPV: 12.1 fL (ref 7.5–12.5)
Monocytes Relative: 7 %
Neutro Abs: 3044 cells/uL (ref 1500–7800)
Neutrophils Relative %: 51.6 %
Platelets: 190 10*3/uL (ref 140–400)
RBC: 4.02 10*6/uL (ref 3.80–5.10)
RDW: 12 % (ref 11.0–15.0)
Total Lymphocyte: 36.5 %
WBC: 5.9 10*3/uL (ref 3.8–10.8)

## 2021-02-03 ENCOUNTER — Encounter: Payer: Self-pay | Admitting: Family Medicine

## 2021-02-03 LAB — CARBOXYHEMOGLOBIN: Carboxyhemoglobin: 3 %TOTAL HGB

## 2021-02-03 NOTE — Telephone Encounter (Signed)
The lab we are waiting on is called carboxyhemoglobin.  We will call the lab to ensure they are running and get an estimated time. The symptoms she is explaining is usually part of the drainage process causing sore throat and ear discomfort.  This should start to improve within the next week.

## 2021-02-05 ENCOUNTER — Other Ambulatory Visit (HOSPITAL_COMMUNITY): Payer: Self-pay

## 2021-02-05 MED FILL — Famotidine Tab 20 MG: ORAL | 30 days supply | Qty: 60 | Fill #0 | Status: AC

## 2021-02-07 ENCOUNTER — Encounter: Payer: Self-pay | Admitting: Family Medicine

## 2021-02-24 ENCOUNTER — Other Ambulatory Visit (HOSPITAL_COMMUNITY): Payer: Self-pay

## 2021-02-24 MED ORDER — IVERMECTIN 3 MG PO TABS
ORAL_TABLET | ORAL | 0 refills | Status: DC
  Filled 2021-02-24: qty 8, 2d supply, fill #0

## 2021-02-24 MED ORDER — METRONIDAZOLE 500 MG PO TABS
ORAL_TABLET | ORAL | 0 refills | Status: AC
  Filled 2021-02-24: qty 14, 7d supply, fill #0

## 2021-02-26 DIAGNOSIS — Z77120 Contact with and (suspected) exposure to mold (toxic): Secondary | ICD-10-CM

## 2021-02-26 HISTORY — DX: Contact with and (suspected) exposure to mold (toxic): Z77.120

## 2021-03-06 ENCOUNTER — Other Ambulatory Visit (HOSPITAL_COMMUNITY): Payer: Self-pay

## 2021-03-06 MED ORDER — CHOLESTYRAMINE 4 GM/DOSE PO POWD
ORAL | 1 refills | Status: AC
  Filled 2021-03-06: qty 368.76, 20d supply, fill #0

## 2021-03-09 ENCOUNTER — Other Ambulatory Visit (HOSPITAL_COMMUNITY): Payer: Self-pay

## 2021-03-10 ENCOUNTER — Encounter: Payer: Self-pay | Admitting: Gastroenterology

## 2021-03-10 ENCOUNTER — Ambulatory Visit (INDEPENDENT_AMBULATORY_CARE_PROVIDER_SITE_OTHER): Payer: No Typology Code available for payment source | Admitting: Gastroenterology

## 2021-03-10 ENCOUNTER — Other Ambulatory Visit (HOSPITAL_COMMUNITY): Payer: Self-pay

## 2021-03-10 VITALS — BP 102/70 | HR 55 | Ht 66.0 in | Wt 127.0 lb

## 2021-03-10 DIAGNOSIS — K259 Gastric ulcer, unspecified as acute or chronic, without hemorrhage or perforation: Secondary | ICD-10-CM

## 2021-03-10 DIAGNOSIS — R140 Abdominal distension (gaseous): Secondary | ICD-10-CM | POA: Diagnosis not present

## 2021-03-10 DIAGNOSIS — R109 Unspecified abdominal pain: Secondary | ICD-10-CM | POA: Diagnosis not present

## 2021-03-10 MED ORDER — CARESTART COVID-19 HOME TEST VI KIT
PACK | 0 refills | Status: AC
  Filled 2021-03-10: qty 4, 4d supply, fill #0

## 2021-03-10 NOTE — Patient Instructions (Addendum)
It was my pleasure to provide care to you today. Based on our discussion, I am providing you with my recommendations below:  RECOMMENDATION(S):   Taper Pepcid in 3 months if symptoms remain adequately controlled  FOLLOW UP:  I would like for you to follow up with me as needed.   BMI:  If you are age 42 or younger, your body mass index should be between 19-25. Your Body mass index is 20.5 kg/m. If this is out of the aformentioned range listed, please consider follow up with your Primary Care Provider.   MY CHART:  The [facility] providers would like to encourage you to use [facility] to communicate with providers for non-urgent requests or questions.  Due to long hold times on the telephone, sending your provider a message by [facility] may be a faster and more efficient way to get a response.  Please allow 48 business hours for a response.  Please remember that this is for non-urgent requests.   Thank you for trusting me with your gastrointestinal care!    Thornton Park, MD, MPH

## 2021-03-10 NOTE — Progress Notes (Addendum)
Referring Provider: Ronnald Nian, DO Primary Care Physician:  Pcp, No  Chief Complaint: Abdominal pain, diarrhea, nausea, vomiting   IMPRESSION:  Abdominal pain, nausea, bloating, post-prandial diarrhea    - intermittent symptoms for years, worse since hiking trip in May    - normal abdominal ultrasound 2020    - negative GI pathogen panel, C diff, O&P, H pylori stool antigen    - normal duodenal and random colon biopsies    - ? SIBO versus IBS or post-infectious IBS H pylori negative gastric ulcer 2021    - intestinal metaplasia on initially biopsies, not seen on follow-up    - unable to tolerate PPI "Yeast overgrowth" with recurrent infections    - treated with Diflucan with improvement of GI symptoms while on treatment   PLAN: - Taper Pepcid in 3 months if symptoms remain adequately controlled - SIBO breath test if symptoms recur - Follow-up PRN   HPI: Kristie [name] is a 40 y.o. female who returns in follow-up after endoscopic evaluation 08/08/2020.  She was initially referred by Dr. [name] Lemma for further evaluation of abdominal pain.  The interval history is obtained through the patient and review of her electronic health record. 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  She has anxiety, exercise-induced asthma, chronic headaches, depression, recurrent urinary tract infections, and possible IBS.  She has a history of possible stomach ulcers and was treated for H. pylori in her 64s.  She notes that she had a colonoscopy and EGD performed with [facility] 12-14 years.  Unfortunately, I am unable to locate those results in EPIC.  She is an established patient with [name]. Has had recurrent yeast infection since having HSV-1 in 2017.    She reports intermittent GI symptoms for years but worse after a 70 mile hiking trip on the [place] May 2021.   She has intermittent nausea, abdominal pain, post-prandial dairrhea when eating sugar,  heartburn and eructation that began after a camping trip in May.  There is frequent bloating and altered bowel habits. Associated anal itching, headaches. Triggered by eating fruits and sugars. Has all sugars from her diet without completing controlling her symptoms. Weight is stable. She has also tried probiotics, ginger tea, eating ginger, Pepto-Bismol, and omeprazole with incomplete relief of symptoms.  Dr. [name] Lemma treated her with omeprazole.  Normal evaluation included CBC, BMP and normal/negative stool studies including C. difficile, ova and parasites, GI pathogen panel, and H. pylori antigen.  Abdominal ultrasound 01/04/2019 was normal.   She has been treated for recurrent yeast infections that improve with antifungal treatment.  Uses boric acid suppositories for prevention.  Prefers to avoid prescription medications if possible.    EGD and colonoscopy were performed 04/17/2020 Colonoscopy was normal.  Random right and left sided colon biopsies were normal. EGD revealed 4 mm gastric antral ulcer with focal intestinal metaplasia, reactive gastropathy, and peptic duodenitis.  Follow-up EGD 08/08/2020 showed antral scarring but with otherwise negative.  Biopsies showed a nonspecific reactive gastropathy.  There was no H. pylori.  Unable to tolerate PPI. No improvement on famotidine.    Called the office with epigastric pain and nausea in June. Started on Carafate. GI pathogen panel and stool for O&P were negative.   Recently called with concerns for ongoing illness, headaches, hair loss, and dizziness. Feels that her stomach is full and unwell despite Pepcid.  Carboxyhemoglobin evaluate was normal.   Symptoms have improved after treatment for intestinal parasites.  Recent evaluation through [facility] has identified positive heavy metal testing. She is struggling with mold in her house. Strongyloides and Giardia treated earlier this year.    Past Medical History:  Diagnosis Date    Anxiety    Asthma, exercise induced    Carbon monoxide exposure    Complication of anesthesia    Very sensitive to medicine   Depression    Dysplasia of cervix, high grade CIN 2    HNP (herniated nucleus pulposus) with myelopathy, cervical    HPV in female    HSV-1 (herpes simplex virus 1) infection    Migraines    Peptic ulcer    Seasonal allergies     Past Surgical History:  Procedure Laterality Date   ANTERIOR CERVICAL DECOMP/DISCECTOMY FUSION N/A 02/01/2018   Procedure: Anterior cervical decompression/discectomy/fusion Cervical five-six Cervical six-seven;  Surgeon: Jovita Gamma, MD;  Location: [facility];  Service: Neurosurgery;  Laterality: N/A;   COLONOSCOPY  2021   LASER ABLATION OF THE CERVIX  2013   CIN-2   UPPER GASTROINTESTINAL ENDOSCOPY  2021   VAGINAL CYST EXCISED  2001    Current Outpatient Medications  Medication Sig Dispense Refill   albuterol (VENTOLIN HFA) 108 (90 Base) MCG/ACT inhaler Inhale 2 puffs into the lungs every 4 (four) hours as needed for wheezing or shortness of breath. 18 g 2   B Complex Vitamins (VITAMIN B COMPLEX PO) Take 1 tablet by mouth daily.     cholestyramine (QUESTRAN) 4 GM/DOSE powder Work up to 1 scoop twice daily as directed. 368.76 g 1   COVID-19 At Home Antigen Test (CARESTART COVID-19 HOME TEST) KIT Use as directed on packaging 4 each 0   diazepam (VALIUM) 5 MG tablet TAKE 1 TABLET BY MOUTH ONCE A DAY AS NEEDED 20 tablet 0   doxycycline (VIBRA-TABS) 100 MG tablet Take 1 tablet (100 mg total) by mouth 2 (two) times daily. 20 tablet 0   famotidine (PEPCID) 20 MG tablet TAKE 1 TABLET (20 MG TOTAL) BY MOUTH 2 (TWO) TIMES DAILY. 60 tablet 3   ivermectin (STROMECTOL) 3 MG TABS tablet Take 4 tablets by mouth daily. 8 tablet 0   MAGNESIUM OXIDE PO Take 5 mLs by mouth 2 (two) times daily.     metroNIDAZOLE (FLAGYL) 500 MG tablet Take one tablet by mouth twice daily. 14 tablet 0   predniSONE (DELTASONE) 20 MG tablet Take 3 tablets by mouth daily  for 3 days, then 2 tabs daily for 3 days, then 1 tab daily for 2 days, then 1/2 tab daily for 2 days. 18 tablet 0   sucralfate (CARAFATE) 1 g tablet Take 1 tablet (1 g total) by mouth 4 (four) times daily -  with meals and at bedtime. 90 tablet 0   tiZANidine (ZANAFLEX) 4 MG capsule TAKE 1 CAPSULE BY MOUTH 3 TIMES DAILY 45 capsule 0   valACYclovir (VALTREX) 500 MG tablet TAKE 1 TABLET BY MOUTH 2 TIMES DAILY 180 tablet 3   VITAMIN D-VITAMIN K PO Take 1 tablet by mouth daily.     No current facility-administered medications for this visit.    Allergies as of 03/10/2021 - Review Complete 01/30/2021  Allergen Reaction Noted   Acyclovir and related Nausea Only and Other (See Comments) 12/12/2015   Protonix [pantoprazole] Anxiety 12/03/2020   Neurontin [gabapentin] Other (See Comments) 05/30/2018   Omeprazole Other (See Comments) 12/03/2020   Tindamax [tinidazole]  12/08/2015   Zoloft [sertraline hcl]  12/31/2010    Family History  Problem Relation  Age of Onset   Hypertension Mother    Anxiety disorder Mother    Breast cancer Maternal Aunt    Prostate cancer Maternal Uncle    Hypertension Maternal Grandmother    Lung cancer Maternal Grandfather    Colon cancer Neg Hx    Colon polyps Neg Hx    Esophageal cancer Neg Hx    Rectal cancer Neg Hx    Stomach cancer Neg Hx       Physical Exam: General:   Alert,  well-nourished, pleasant and cooperative in NAD Head:  Normocephalic and atraumatic. Eyes:  Sclera clear, no icterus.   Conjunctiva pink. Abdomen:  Soft, thin, nontender, nondistended, normal bowel sounds, no rebound or guarding. No hepatosplenomegaly.  I am unable to reproduce her pain on exam. Neurologic:  Alert and  oriented x4;  grossly nonfocal Skin:  Intact without significant lesions or rashes. Psych:  Alert and cooperative. Normal mood and affect.    Lareen Mullings L. Tarri Glenn, MD, MPH 03/10/2021, 2:20 PM

## 2021-04-23 ENCOUNTER — Other Ambulatory Visit: Payer: Self-pay

## 2021-04-24 ENCOUNTER — Other Ambulatory Visit (HOSPITAL_COMMUNITY): Payer: Self-pay

## 2021-05-11 ENCOUNTER — Other Ambulatory Visit (HOSPITAL_COMMUNITY): Payer: Self-pay

## 2021-05-11 MED ORDER — ITRACONAZOLE 100 MG PO CAPS
100.0000 mg | ORAL_CAPSULE | Freq: Two times a day (BID) | ORAL | 2 refills | Status: AC
  Filled 2021-05-11: qty 60, 30d supply, fill #0
  Filled 2021-06-15: qty 60, 30d supply, fill #1
  Filled 2021-07-14: qty 60, 30d supply, fill #2

## 2021-05-12 ENCOUNTER — Other Ambulatory Visit (HOSPITAL_COMMUNITY): Payer: Self-pay

## 2021-05-12 ENCOUNTER — Ambulatory Visit: Payer: No Typology Code available for payment source | Admitting: Family Medicine

## 2021-06-05 ENCOUNTER — Other Ambulatory Visit (HOSPITAL_COMMUNITY): Payer: Self-pay

## 2021-06-16 ENCOUNTER — Other Ambulatory Visit (HOSPITAL_COMMUNITY): Payer: Self-pay

## 2021-06-17 ENCOUNTER — Other Ambulatory Visit (HOSPITAL_COMMUNITY): Payer: Self-pay

## 2021-07-14 ENCOUNTER — Other Ambulatory Visit (HOSPITAL_COMMUNITY): Payer: Self-pay

## 2021-07-15 ENCOUNTER — Other Ambulatory Visit (HOSPITAL_COMMUNITY): Payer: Self-pay

## 2021-08-11 ENCOUNTER — Other Ambulatory Visit (HOSPITAL_COMMUNITY): Payer: Self-pay

## 2021-08-11 ENCOUNTER — Telehealth: Payer: No Typology Code available for payment source | Admitting: Family Medicine

## 2021-08-11 DIAGNOSIS — M542 Cervicalgia: Secondary | ICD-10-CM

## 2021-08-11 DIAGNOSIS — M5412 Radiculopathy, cervical region: Secondary | ICD-10-CM | POA: Diagnosis not present

## 2021-08-11 MED ORDER — PREDNISONE 10 MG (21) PO TBPK
ORAL_TABLET | ORAL | 0 refills | Status: AC
  Filled 2021-08-11: qty 21, 6d supply, fill #0

## 2021-08-11 NOTE — Patient Instructions (Signed)
Please don't hesitate to go be seen in person if symptoms worsen or fail to improve.   I hope you feel better soon.   With gratitude  Jarrett Soho

## 2021-08-11 NOTE — Progress Notes (Signed)
Virtual Visit Consent   [name], you are scheduled for a virtual visit with a [facility] provider today.     Just as with appointments in the office, your consent must be obtained to participate.  Your consent [name] be active for this visit and any virtual visit you may have with one of our providers in the next 365 days.     If you have a MyChart account, a copy of this consent can be sent to you electronically.  All virtual visits are billed to your insurance company just like a traditional visit in the office.    As this is a virtual visit, video technology does not allow for your provider to perform a traditional examination.  This may limit your provider's ability to fully assess your condition.  If your provider identifies any concerns that need to be evaluated in person or the need to arrange testing (such as labs, EKG, etc.), we [name] make arrangements to do so.     Although advances in technology are sophisticated, we cannot ensure that it [name] always work on either your end or our end.  If the connection with a video visit is poor, the visit may have to be switched to a telephone visit.  With either a video or telephone visit, we are not always able to ensure that we have a secure connection.     I need to obtain your verbal consent now.   Are you willing to proceed with your visit today?    Kristie [name] has provided verbal consent on 08/11/2021 for a virtual visit (video or telephone).   Perlie Mayo, NP   Date: 08/11/2021 1:14 PM   Virtual Visit via Video Note   I, Perlie Mayo, connected with  Kristie [name]  ([id number], 1980-07-27) on 08/11/21 at  1:15 PM EST by a video-enabled telemedicine application and verified that I am speaking with the correct person using two identifiers.  Location: Patient: Virtual Visit Location Patient: Home Provider: Virtual Visit Location Provider: Home Office   I discussed the limitations of evaluation and management by  telemedicine and the availability of in person appointments. The patient expressed understanding and agreed to proceed.    History of Present Illness: Kristie [name] is a 41 y.o. who identifies as a female who was assigned female at birth, and is being seen today for neck pain. Had neck surgery 3 years ago she has instability of this area due to poor healing and lack of fusion. Had a fall 3 weeks ago, hit her bottom, but developed nerve and muscle pain 3-4 days later. This is common for her due to the radiculopathy.  Discomfort, pain (6/10) yesterday was 9/10. Reports taking valium without much relief last night. Reports prednisone dose back usually helps. Has PCP appt next week, but doesn't think she can last that long.    No changes that are new in sensation, that are concern emergent follow up. See chart for extended hx in notes.  No new sensations changes from central cord syndrome that she has had.  No red flags- reviewed neurosx notes in detail, [name] try pred dose pack and have her follow up next week.   Problems:  Patient Active Problem List   Diagnosis Date Noted   Lumbar radiculopathy 05/13/2020   Spondylosis without myelopathy or radiculopathy, cervical region 10/02/2019   Pseudoarthrosis of cervical spine ([place]) 10/02/2019   HNP (herniated nucleus pulposus), cervical 02/01/2018   Thoracic outlet  syndrome 09/07/2017   Dysesthesia of multiple sites 09/07/2017   Pain in thoracic spine 02/18/2017   Congenital cavus foot 02/18/2017   History of cervical dysplasia 01/20/2017   HSV-1 (herpes simplex virus 1) infection 07/08/2015   Migraines 08/05/2011   Allergic rhinitis 06/12/2010   Asthma 06/05/2010    Allergies:  Allergies  Allergen Reactions   Acyclovir And Related Nausea Only and Other (See Comments)    Abdominal pain dizziness.   Protonix [Pantoprazole] Anxiety   Neurontin [Gabapentin] Other (See Comments)    Caused symptoms of depression   Omeprazole Other (See  Comments)    headaches   Tindamax [Tinidazole]     UNSPECIFIED REACTION    Zoloft [Sertraline Hcl]     paranoia   Medications:  Current Outpatient Medications:    albuterol (VENTOLIN HFA) 108 (90 Base) MCG/ACT inhaler, Inhale 2 puffs into the lungs every 4 (four) hours as needed for wheezing or shortness of breath., Disp: 18 g, Rfl: 2   B Complex Vitamins (VITAMIN B COMPLEX PO), Take 1 tablet by mouth daily., Disp: , Rfl:    cholestyramine (QUESTRAN) 4 GM/DOSE powder, Work up to 1 scoop twice daily as directed., Disp: 368.76 g, Rfl: 1   COVID-19 At Home Antigen Test (CARESTART COVID-19 HOME TEST) KIT, Use as directed on packaging, Disp: 4 each, Rfl: 0   famotidine (PEPCID) 20 MG tablet, TAKE 1 TABLET (20 MG TOTAL) BY MOUTH 2 (TWO) TIMES DAILY., Disp: 60 tablet, Rfl: 3   itraconazole (SPORANOX) 100 MG capsule, Take 1 capsule (100 mg total) by mouth 2 (two) times daily., Disp: 60 capsule, Rfl: 2   MAGNESIUM OXIDE PO, Take 5 mLs by mouth 2 (two) times daily., Disp: , Rfl:    metroNIDAZOLE (FLAGYL) 500 MG tablet, Take one tablet by mouth twice daily., Disp: 14 tablet, Rfl: 0   tiZANidine (ZANAFLEX) 4 MG capsule, TAKE 1 CAPSULE BY MOUTH 3 TIMES DAILY, Disp: 45 capsule, Rfl: 0   valACYclovir (VALTREX) 500 MG tablet, TAKE 1 TABLET BY MOUTH 2 TIMES DAILY, Disp: 180 tablet, Rfl: 3   VITAMIN D-VITAMIN K PO, Take 1 tablet by mouth daily., Disp: , Rfl:   Observations/Objective: Patient is well-developed, well-nourished in no acute distress.  Resting comfortably  at home.  Head is normocephalic, atraumatic.  No labored breathing.  Speech is clear and coherent with logical content.  Patient is alert and oriented at baseline.    Assessment and Plan:  1. Cervicalgia  - predniSONE (STERAPRED UNI-PAK 21 TAB) 10 MG (21) TBPK tablet; Take as directed  Dispense: 21 tablet; Refill: 0  2. Radiculopathy of cervical region  - predniSONE (STERAPRED UNI-PAK 21 TAB) 10 MG (21) TBPK tablet; Take as directed   Dispense: 21 tablet; Refill: 0   No new sensations changes from central cord syndrome that she has had.  No red flags- reviewed neurosx notes in detail, [name] try pred dose pack and have her follow up next week.   Strict ED precautions reviewed   Reviewed side effects, risks and benefits of medication.    Patient acknowledged agreement and understanding of the plan.     Follow Up Instructions: I discussed the assessment and treatment plan with the patient. The patient was provided an opportunity to ask questions and all were answered. The patient agreed with the plan and demonstrated an understanding of the instructions.  A copy of instructions were sent to the patient via MyChart unless otherwise noted below.    The patient was advised  to call back or seek an in-person evaluation if the symptoms worsen or if the condition fails to improve as anticipated.  Time:  I spent 10 minutes with the patient via telehealth technology discussing the above problems/concerns.    Perlie Mayo, NP

## 2021-08-25 ENCOUNTER — Other Ambulatory Visit (HOSPITAL_COMMUNITY): Payer: Self-pay

## 2021-08-25 MED ORDER — TIZANIDINE HCL 4 MG PO CAPS
4.0000 mg | ORAL_CAPSULE | Freq: Three times a day (TID) | ORAL | 0 refills | Status: DC
  Filled 2021-08-25: qty 45, 15d supply, fill #0

## 2021-08-26 ENCOUNTER — Other Ambulatory Visit (HOSPITAL_COMMUNITY): Payer: Self-pay | Admitting: Student

## 2021-08-26 ENCOUNTER — Other Ambulatory Visit (HOSPITAL_COMMUNITY): Payer: Self-pay

## 2021-08-26 ENCOUNTER — Other Ambulatory Visit: Payer: Self-pay | Admitting: Student

## 2021-08-26 DIAGNOSIS — M5416 Radiculopathy, lumbar region: Secondary | ICD-10-CM

## 2021-08-26 DIAGNOSIS — M5412 Radiculopathy, cervical region: Secondary | ICD-10-CM

## 2021-08-27 ENCOUNTER — Other Ambulatory Visit (HOSPITAL_COMMUNITY): Payer: Self-pay

## 2021-08-27 MED ORDER — DIAZEPAM 5 MG PO TABS
ORAL_TABLET | ORAL | 0 refills | Status: AC
  Filled 2021-08-27: qty 20, 20d supply, fill #0

## 2021-08-28 ENCOUNTER — Other Ambulatory Visit (HOSPITAL_COMMUNITY): Payer: Self-pay

## 2021-09-06 ENCOUNTER — Ambulatory Visit (HOSPITAL_COMMUNITY)
Admission: RE | Admit: 2021-09-06 | Discharge: 2021-09-06 | Disposition: A | Discharge status: Home / Self Care | Payer: No Typology Code available for payment source | Source: Ambulatory Visit | Attending: Student | Admitting: Student

## 2021-09-06 ENCOUNTER — Other Ambulatory Visit: Payer: Self-pay

## 2021-09-06 DIAGNOSIS — M5416 Radiculopathy, lumbar region: Secondary | ICD-10-CM | POA: Diagnosis present

## 2021-09-06 DIAGNOSIS — M5412 Radiculopathy, cervical region: Secondary | ICD-10-CM | POA: Insufficient documentation

## 2021-09-24 ENCOUNTER — Other Ambulatory Visit (HOSPITAL_COMMUNITY): Payer: Self-pay

## 2021-09-24 MED ORDER — TIZANIDINE HCL 4 MG PO CAPS
ORAL_CAPSULE | ORAL | 0 refills | Status: AC
  Filled 2021-09-24: qty 45, 15d supply, fill #0

## 2021-10-17 ENCOUNTER — Other Ambulatory Visit (HOSPITAL_COMMUNITY): Payer: Self-pay

## 2021-11-24 ENCOUNTER — Other Ambulatory Visit (HOSPITAL_COMMUNITY): Payer: Self-pay

## 2021-11-24 MED ORDER — ITRACONAZOLE 100 MG PO CAPS
100.0000 mg | ORAL_CAPSULE | Freq: Two times a day (BID) | ORAL | 2 refills | Status: DC
  Filled 2021-11-24: qty 60, 30d supply, fill #0
  Filled 2021-12-16 (×2): qty 60, 30d supply, fill #1
  Filled 2022-02-12: qty 60, 30d supply, fill #2

## 2021-11-25 ENCOUNTER — Other Ambulatory Visit (HOSPITAL_COMMUNITY): Payer: Self-pay

## 2021-11-26 ENCOUNTER — Other Ambulatory Visit (HOSPITAL_COMMUNITY): Payer: Self-pay

## 2021-12-16 ENCOUNTER — Other Ambulatory Visit: Payer: Self-pay | Admitting: Nurse Practitioner

## 2021-12-16 ENCOUNTER — Other Ambulatory Visit (HOSPITAL_COMMUNITY): Payer: Self-pay

## 2021-12-16 DIAGNOSIS — B009 Herpesviral infection, unspecified: Secondary | ICD-10-CM

## 2021-12-16 MED ORDER — VALACYCLOVIR HCL 500 MG PO TABS
ORAL_TABLET | Freq: Two times a day (BID) | ORAL | 0 refills | Status: AC
  Filled 2021-12-16: qty 180, fill #0

## 2021-12-16 NOTE — Telephone Encounter (Signed)
AEX was 01/12/2021.

## 2021-12-18 ENCOUNTER — Other Ambulatory Visit (HOSPITAL_COMMUNITY): Payer: Self-pay

## 2022-01-12 ENCOUNTER — Other Ambulatory Visit (HOSPITAL_COMMUNITY): Payer: Self-pay

## 2022-01-12 MED ORDER — VALACYCLOVIR HCL 500 MG PO TABS
500.0000 mg | ORAL_TABLET | Freq: Two times a day (BID) | ORAL | 11 refills | Status: AC
  Filled 2022-01-12: qty 180, 90d supply, fill #0
  Filled 2022-03-25 – 2022-04-02 (×2): qty 180, 90d supply, fill #1
  Filled 2022-04-22: qty 180, 90d supply, fill #2
  Filled 2022-04-22: qty 120, 60d supply, fill #2

## 2022-01-13 ENCOUNTER — Other Ambulatory Visit (HOSPITAL_COMMUNITY): Payer: Self-pay

## 2022-01-13 ENCOUNTER — Ambulatory Visit: Payer: No Typology Code available for payment source | Admitting: Nurse Practitioner

## 2022-01-18 ENCOUNTER — Ambulatory Visit: Payer: No Typology Code available for payment source | Admitting: Nurse Practitioner

## 2022-01-21 ENCOUNTER — Other Ambulatory Visit (HOSPITAL_COMMUNITY): Payer: Self-pay

## 2022-01-27 ENCOUNTER — Other Ambulatory Visit (HOSPITAL_COMMUNITY): Payer: Self-pay

## 2022-01-27 MED ORDER — PRAZIQUANTEL 600 MG PO TABS
ORAL_TABLET | ORAL | 0 refills | Status: DC
  Filled 2022-01-27: qty 2, 21d supply, fill #0

## 2022-01-28 ENCOUNTER — Other Ambulatory Visit (HOSPITAL_COMMUNITY): Payer: Self-pay

## 2022-01-29 ENCOUNTER — Other Ambulatory Visit (HOSPITAL_COMMUNITY): Payer: Self-pay

## 2022-02-12 ENCOUNTER — Other Ambulatory Visit (HOSPITAL_COMMUNITY): Payer: Self-pay

## 2022-02-15 ENCOUNTER — Other Ambulatory Visit (HOSPITAL_COMMUNITY): Payer: Self-pay

## 2022-03-05 ENCOUNTER — Other Ambulatory Visit (HOSPITAL_COMMUNITY): Payer: Self-pay

## 2022-03-05 MED ORDER — PRAZIQUANTEL 600 MG PO TABS
600.0000 mg | ORAL_TABLET | ORAL | 0 refills | Status: AC
  Filled 2022-03-05 (×2): qty 2, 21d supply, fill #0

## 2022-03-25 ENCOUNTER — Other Ambulatory Visit (HOSPITAL_COMMUNITY): Payer: Self-pay

## 2022-04-01 ENCOUNTER — Other Ambulatory Visit (HOSPITAL_COMMUNITY): Payer: Self-pay

## 2022-04-01 MED ORDER — ITRACONAZOLE 100 MG PO CAPS
100.0000 mg | ORAL_CAPSULE | Freq: Two times a day (BID) | ORAL | 2 refills | Status: AC
  Filled 2022-04-01: qty 180, 90d supply, fill #0

## 2022-04-02 ENCOUNTER — Other Ambulatory Visit (HOSPITAL_COMMUNITY): Payer: Self-pay

## 2022-04-05 ENCOUNTER — Other Ambulatory Visit (HOSPITAL_COMMUNITY): Payer: Self-pay

## 2022-04-22 ENCOUNTER — Other Ambulatory Visit (HOSPITAL_COMMUNITY): Payer: Self-pay

## 2022-04-23 ENCOUNTER — Other Ambulatory Visit (HOSPITAL_COMMUNITY): Payer: Self-pay

## 2022-04-28 ENCOUNTER — Other Ambulatory Visit (HOSPITAL_COMMUNITY): Payer: Self-pay

## 2022-04-28 MED ORDER — FLUCONAZOLE 200 MG PO TABS
200.0000 mg | ORAL_TABLET | Freq: Every day | ORAL | 0 refills | Status: AC
Start: 2022-04-28 — End: ?
  Filled 2022-04-28: qty 30, 30d supply, fill #0

## 2022-04-29 ENCOUNTER — Other Ambulatory Visit (HOSPITAL_COMMUNITY): Payer: Self-pay
